# Patient Record
Sex: Female | Born: 1945 | Race: White | Hispanic: No | Marital: Married | Smoking: Former smoker
Health system: Southern US, Community
[De-identification: ages and names within clinical notes are randomized; demographics above are authoritative.]

## PROBLEM LIST (undated history)

## (undated) DIAGNOSIS — J189 Pneumonia, unspecified organism: Secondary | ICD-10-CM

## (undated) DIAGNOSIS — J302 Other seasonal allergic rhinitis: Secondary | ICD-10-CM

## (undated) DIAGNOSIS — E785 Hyperlipidemia, unspecified: Secondary | ICD-10-CM

## (undated) DIAGNOSIS — F419 Anxiety disorder, unspecified: Secondary | ICD-10-CM

## (undated) DIAGNOSIS — R51 Headache: Secondary | ICD-10-CM

## (undated) DIAGNOSIS — K589 Irritable bowel syndrome without diarrhea: Secondary | ICD-10-CM

## (undated) DIAGNOSIS — K219 Gastro-esophageal reflux disease without esophagitis: Secondary | ICD-10-CM

## (undated) DIAGNOSIS — I10 Essential (primary) hypertension: Secondary | ICD-10-CM

## (undated) DIAGNOSIS — J439 Emphysema, unspecified: Secondary | ICD-10-CM

## (undated) DIAGNOSIS — Z8349 Family history of other endocrine, nutritional and metabolic diseases: Secondary | ICD-10-CM

## (undated) DIAGNOSIS — G589 Mononeuropathy, unspecified: Secondary | ICD-10-CM

## (undated) DIAGNOSIS — R011 Cardiac murmur, unspecified: Secondary | ICD-10-CM

## (undated) DIAGNOSIS — M1990 Unspecified osteoarthritis, unspecified site: Secondary | ICD-10-CM

## (undated) DIAGNOSIS — S149XXA Injury of unspecified nerves of neck, initial encounter: Secondary | ICD-10-CM

## (undated) DIAGNOSIS — Z8489 Family history of other specified conditions: Secondary | ICD-10-CM

## (undated) DIAGNOSIS — I499 Cardiac arrhythmia, unspecified: Secondary | ICD-10-CM

## (undated) DIAGNOSIS — D649 Anemia, unspecified: Secondary | ICD-10-CM

## (undated) HISTORY — PX: EYE SURGERY: SHX253

## (undated) HISTORY — PX: FINE NEEDLE ASPIRATION: SHX406

## (undated) HISTORY — DX: Hyperlipidemia, unspecified: E78.5

## (undated) HISTORY — DX: Essential (primary) hypertension: I10

## (undated) HISTORY — PX: ABLATION: SHX5711

## (undated) HISTORY — PX: CARDIAC CATHETERIZATION: SHX172

## (undated) HISTORY — DX: Family history of other endocrine, nutritional and metabolic diseases: Z83.49

## (undated) HISTORY — PX: CHOLECYSTECTOMY: SHX55

## (undated) HISTORY — PX: TONSILLECTOMY: SUR1361

## (undated) HISTORY — PX: CYST EXCISION: SHX5701

## (undated) HISTORY — PX: APPENDECTOMY: SHX54

---

## 2010-05-01 LAB — BASIC METABOLIC PANEL
Creatinine: 0.7 mg/dL (ref 0.5–1.1)
Glucose: 92 mg/dL
Potassium: 4.2 mmol/L (ref 3.4–5.3)
Sodium: 141 mmol/L (ref 137–147)

## 2010-05-01 LAB — HEPATIC FUNCTION PANEL
ALT: 12 U/L (ref 7–35)
AST: 13 U/L (ref 13–35)
Bilirubin, Total: 0.2 mg/dL

## 2010-05-01 LAB — HEMOGLOBIN A1C: Hgb A1c MFr Bld: 6 % (ref 4.0–6.0)

## 2010-07-13 HISTORY — PX: CHOLECYSTECTOMY, LAPAROSCOPIC: SHX56

## 2010-07-24 LAB — HEPATIC FUNCTION PANEL
AST: 10 U/L — AB (ref 13–35)
Bilirubin, Total: 0.2 mg/dL

## 2010-07-24 LAB — CBC AND DIFFERENTIAL: WBC: 14.5 10^3/mL

## 2010-07-24 LAB — LIPID PANEL: HDL: 38 mg/dL (ref 35–70)

## 2010-11-09 ENCOUNTER — Ambulatory Visit (INDEPENDENT_AMBULATORY_CARE_PROVIDER_SITE_OTHER): Payer: Medicare HMO | Admitting: Family Medicine

## 2010-11-09 ENCOUNTER — Encounter: Payer: Self-pay | Admitting: Family Medicine

## 2010-11-09 VITALS — BP 136/80 | HR 81 | Ht 65.5 in | Wt 193.0 lb

## 2010-11-09 DIAGNOSIS — E8881 Metabolic syndrome: Secondary | ICD-10-CM

## 2010-11-09 DIAGNOSIS — E0590 Thyrotoxicosis, unspecified without thyrotoxic crisis or storm: Secondary | ICD-10-CM

## 2010-11-09 DIAGNOSIS — J449 Chronic obstructive pulmonary disease, unspecified: Secondary | ICD-10-CM

## 2010-11-09 DIAGNOSIS — F17200 Nicotine dependence, unspecified, uncomplicated: Secondary | ICD-10-CM

## 2010-11-09 DIAGNOSIS — E785 Hyperlipidemia, unspecified: Secondary | ICD-10-CM

## 2010-11-09 DIAGNOSIS — R7309 Other abnormal glucose: Secondary | ICD-10-CM

## 2010-11-09 DIAGNOSIS — J329 Chronic sinusitis, unspecified: Secondary | ICD-10-CM

## 2010-11-09 DIAGNOSIS — E0500 Thyrotoxicosis with diffuse goiter without thyrotoxic crisis or storm: Secondary | ICD-10-CM | POA: Insufficient documentation

## 2010-11-09 DIAGNOSIS — R3129 Other microscopic hematuria: Secondary | ICD-10-CM

## 2010-11-09 DIAGNOSIS — Z720 Tobacco use: Secondary | ICD-10-CM

## 2010-11-09 DIAGNOSIS — Z23 Encounter for immunization: Secondary | ICD-10-CM

## 2010-11-09 DIAGNOSIS — I10 Essential (primary) hypertension: Secondary | ICD-10-CM | POA: Insufficient documentation

## 2010-11-09 MED ORDER — AZITHROMYCIN 250 MG PO TABS: ORAL_TABLET | ORAL | Status: AC

## 2010-11-09 MED ORDER — COLESEVELAM HCL 3.75 G PO PACK: PACK | ORAL | Status: DC

## 2010-11-09 NOTE — Patient Instructions (Signed)
We should schedule your for spirometry when you are feeling well to stage your COPD.

## 2010-11-09 NOTE — Progress Notes (Signed)
Subjective:    Patient ID: Laura Ellison, female    DOB: Apr 22, 1945, 65 y.o.   MRN: [id number]  HPI Hx of hyperthyroidism. On methimazole.  She was diagnosed approximately 2 years ago. Need referral to endocrinology.    URI-she has had cold symptoms for approximately 4 weeks. She's had predominantly sinus congestion and sinus pressure and pain and headache. No tear pain or pressure. Mild sore throat occasionally. No fever. She has tried some cough and cold medications. She was also seen recently at an urgent care and treated with steroids, albuterol and put on Levaquin. She said she ended up going to the emergency room after taking the steroids because it caused chest pain. She does not want to take these again. She does use her Advair occasionally. She said she was diagnosed with COPD in the past but her last pulmonary infection testing was about 3 years ago. She did complete the Levaquin but says she really doesn't feel any better. She has been taking her allergy medication. She recently moved here from [place].  Review of Systems  Constitutional: Negative for fever, diaphoresis and unexpected weight change.  HENT: Negative for hearing loss, rhinorrhea and tinnitus.        Seasonal allergies  Eyes: Negative for visual disturbance.  Respiratory: Positive for cough. Negative for wheezing.   Cardiovascular: Negative for chest pain and palpitations.  Gastrointestinal: Negative for nausea, vomiting, diarrhea and blood in stool.  Genitourinary: Negative for vaginal bleeding, vaginal discharge and difficulty urinating.       Nighttime urination  Musculoskeletal: Positive for myalgias and arthralgias.  Skin: Negative for rash.  Neurological: Positive for headaches.  Hematological: Negative for adenopathy. Does not bruise/bleed easily.  Psychiatric/Behavioral: Negative for sleep disturbance and dysphoric mood. The patient is not nervous/anxious.        BP 136/80  Pulse 81  Ht 5' 5.5" (1.664  m)  Wt 193 lb (87.544 kg)  BMI 31.63 kg/m2    Allergies  Allergen Reactions  . Augmentin Diarrhea    Severe abdominal cramps and diarrhea  . Crestor (Rosuvastatin Calcium)   . Lipitor (Atorvastatin Calcium)   . Pravachol   . Prednisone Other (See Comments)    Chest discomfort  . Zocor (Simvastatin - High Dose)     Past Medical History  Diagnosis Date  . Hypertension   . Hyperlipidemia   . thyroid problem     Past Surgical History  Procedure Date  . Laproscopic gallbladder 07/2010    History   Social History  . Marital Status: Married    Spouse Name: Laura Ellison     Number of Children: 2  . Years of Education: HS   Occupational History  . Retired.     Social History Main Topics  . Smoking status: Current Everyday Smoker -- 2.0 packs/day for 50 years    Types: Cigarettes  . Smokeless tobacco: Not on file  . Alcohol Use: No  . Drug Use: No  . Sexually Active: Not Currently   Other Topics Concern  . Not on file   Social History Narrative   10 caffeinated drinks daily. No regular exercise. She recently moved here from [place] she now lives with her husband, daughter, granddaughter, son and sometimes grandson.    Family History  Problem Relation Age of Onset  . Lymphoma Father   . Diabetes Maternal Grandmother   . Stroke Maternal Grandmother   . Aortic aneurysm Mother     died from  . Stroke  Maternal Uncle   . Heart attack      Mothers family    [name] does not currently have medications on file.  Objective:   Physical Exam  Constitutional: She is oriented to person, place, and time. She appears well-developed and well-nourished.  HENT:  Head: Normocephalic and atraumatic.  Right Ear: External ear normal.  Left Ear: External ear normal.  Nose: Nose normal.  Mouth/Throat: Oropharynx is clear and moist.       TMs and canals are clear.   Eyes: Conjunctivae and EOM are normal. Pupils are equal, round, and reactive to light.  Neck: Neck supple.  No thyromegaly present.  Cardiovascular: Normal rate, regular rhythm and normal heart sounds.   Pulmonary/Chest: Effort normal and breath sounds normal. She has no wheezes.  Lymphadenopathy:    She has no cervical adenopathy.  Neurological: She is alert and oriented to person, place, and time.  Skin: Skin is warm and dry.  Psychiatric: She has a normal mood and affect.          Assessment & Plan:  Sinusitis-I will try treating her with azithromycin which will cover atypicals and she did not improve with the Levaquin. Her lung exam is normal today and she does not appear to be short of breath thus I do not think she is having a COPD exacerbation.  COPD-we need to schedule her for spirometry and she is feeling well to better stage her since her last testing was approximately 3 years ago. I don't know if she needs to use the Advair regularly or not we will need to determine this by GOLD criteria.   Hyperlipidemia She had lab work done in June. We'll need to repeat her cholesterol levels in 6 months. Which would be December. She has been intolerant to multiple statins in the past. She is up and trying the WelChol powder. We'll see if this works and then recheck in a couple months after she is on the medication. If she does not tolerate it I requested she call the office and we can consider a different medication at all possible. Also regular exercise which she does not do as well as a low-fat diet and weight loss would be significantly helpful to her cholesterol.  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 I recommend repeating that in December to make sure that she is staying stable. Her husband is a diabetic.

## 2010-11-10 ENCOUNTER — Encounter: Payer: Self-pay | Admitting: Family Medicine

## 2010-11-10 DIAGNOSIS — E8881 Metabolic syndrome: Secondary | ICD-10-CM | POA: Insufficient documentation

## 2010-11-10 DIAGNOSIS — Z720 Tobacco use: Secondary | ICD-10-CM | POA: Insufficient documentation

## 2010-12-06 ENCOUNTER — Encounter: Payer: Self-pay | Admitting: Family Medicine

## 2010-12-06 DIAGNOSIS — E010 Iodine-deficiency related diffuse (endemic) goiter: Secondary | ICD-10-CM | POA: Insufficient documentation

## 2010-12-06 DIAGNOSIS — J439 Emphysema, unspecified: Secondary | ICD-10-CM | POA: Insufficient documentation

## 2010-12-07 ENCOUNTER — Encounter: Payer: Self-pay | Admitting: Family Medicine

## 2011-02-13 ENCOUNTER — Emergency Department (INDEPENDENT_AMBULATORY_CARE_PROVIDER_SITE_OTHER)
Admission: EM | Admit: 2011-02-13 | Discharge: 2011-02-13 | Disposition: A | Discharge status: Home / Self Care | Payer: Medicare Other | Source: Home / Self Care | Attending: Emergency Medicine | Admitting: Emergency Medicine

## 2011-02-13 DIAGNOSIS — J069 Acute upper respiratory infection, unspecified: Secondary | ICD-10-CM

## 2011-02-13 MED ORDER — AZITHROMYCIN 250 MG PO TABS: ORAL_TABLET | ORAL | Status: AC

## 2011-02-13 NOTE — ED Notes (Signed)
States upper respiratory symptoms started 2 days ago

## 2011-02-13 NOTE — ED Provider Notes (Signed)
History     CSN: [id number]  Arrival date & time 02/13/11  1719   First MD Initiated Contact with Patient 02/13/11 1747      Chief Complaint  Patient presents with  . URI    (Consider location/radiation/quality/duration/timing/severity/associated sxs/prior treatment) HPI Laura Ellison is a 66 y.o. female who complains of onset of cold symptoms for a few days.  She has emphysema and is trying to avoid getting worse. No sore throat + cough No pleuritic pain No wheezing + nasal congestion + post-nasal drainage + sinus pain/pressure No chest congestion No itchy/red eyes No earache No hemoptysis No SOB No chills/sweats No fever No nausea No vomiting No abdominal pain No diarrhea No skin rashes No fatigue No myalgias No headache    Past Medical History  Diagnosis Date  . Hypertension   . Hyperlipidemia   . thyroid problem     Past Surgical History  Procedure Date  . Cholecystectomy, laparoscopic 07/13/2010    Family History  Problem Relation Age of Onset  . Lymphoma Father   . Diabetes Maternal Grandmother   . Stroke Maternal Grandmother   . Aortic aneurysm Mother     died from  . Stroke Maternal Uncle   . Heart attack      Mothers family  . Aortic aneurysm Mother     Deceased    History  Substance Use Topics  . Smoking status: Current Everyday Smoker -- 2.0 packs/day for 50 years    Types: Cigarettes  . Smokeless tobacco: Not on file  . Alcohol Use: No    OB History    Grav Para Term Preterm Abortions TAB SAB Ect Mult Living                  Review of Systems  Allergies  Augmentin; Crestor; Lipitor; Pravachol; Prednisone; and Zocor  Home Medications   Current Outpatient Rx  Name Route Sig Dispense Refill  . ALPRAZOLAM 0.25 MG PO TABS Oral Take 0.25 mg by mouth at bedtime as needed.      . AZITHROMYCIN 250 MG PO TABS  Use as directed 1 each 0  . VITAMIN D3 1000 UNITS PO CAPS Oral Take by mouth.      . COLESEVELAM HCL 3.75 G PO PACK  Mix one  pack with a glass of water an ddrink once a day. 30 each 6  . CYANOCOBALAMIN 100 MCG PO TABS Oral Take 100 mcg by mouth daily.      Marland Kitchen FEXOFENADINE HCL 180 MG PO TABS Oral Take 180 mg by mouth daily.      Marland Kitchen ADVAIR DISKUS IN Inhalation Inhale into the lungs.      Marland Kitchen FLUTICASONE-SALMETEROL 250-50 MCG/DOSE IN AEPB Inhalation Inhale 1 puff into the lungs every 12 (twelve) hours.      Marland Kitchen LOSARTAN POTASSIUM 50 MG PO TABS Oral Take 50 mg by mouth daily.      Marland Kitchen METHIMAZOLE 10 MG PO TABS Oral Take 10 mg by mouth 3 (three) times daily.      Marland Kitchen METOPROLOL TARTRATE 25 MG PO TABS Oral Take 25 mg by mouth 2 (two) times daily.      . CENTRUM SILVER ULTRA WOMENS PO Oral Take by mouth.      . OMEPRAZOLE 40 MG PO CPDR Oral Take 40 mg by mouth daily.        BP 129/80  Pulse 69  Temp(Src) 98.3 F (36.8 C) (Oral)  Resp 24  Ht 5\' 6"  (1.676 m)  Wt 193 lb (87.544 kg)  BMI 31.15 kg/m2  SpO2 94%  Physical Exam  Nursing note and vitals reviewed. Constitutional: She is oriented to person, place, and time. She appears well-developed and well-nourished.  HENT:  Head: Normocephalic and atraumatic.  Right Ear: Tympanic membrane, external ear and ear canal normal.  Left Ear: Tympanic membrane, external ear and ear canal normal.  Nose: Mucosal edema and rhinorrhea present.  Mouth/Throat: Posterior oropharyngeal erythema present. No oropharyngeal exudate or posterior oropharyngeal edema.  Eyes: No scleral icterus.  Neck: Neck supple.  Cardiovascular: Regular rhythm and normal heart sounds.   Pulmonary/Chest: Effort normal and breath sounds normal. No respiratory distress.  Neurological: She is alert and oriented to person, place, and time.  Skin: Skin is warm and dry.  Psychiatric: She has a normal mood and affect. Her speech is normal.    ED Course  Procedures (including critical care time)  Labs Reviewed - No data to display No results found.   1. Acute upper respiratory infections of unspecified site        MDM  1)  Take the prescribed antibiotic as instructed. 2)  Use nasal saline solution (over the counter) at least 3 times a day. 3)  Use over the counter decongestants like Zyrtec-D every 12 hours as needed to help with congestion.  If you have hypertension, do not take medicines with sudafed.  4)  Can take tylenol every 6 hours or motrin every 8 hours for pain or fever. 5)  Follow up with your primary doctor if no improvement in 5-7 days, sooner if increasing pain, fever, or new symptoms.     Lily Kocher, MD 02/13/11 [phone]

## 2011-03-12 ENCOUNTER — Ambulatory Visit: Payer: Medicare Other | Admitting: Family Medicine

## 2011-03-15 ENCOUNTER — Ambulatory Visit (INDEPENDENT_AMBULATORY_CARE_PROVIDER_SITE_OTHER): Payer: Medicare Other | Admitting: Family Medicine

## 2011-03-15 ENCOUNTER — Encounter: Payer: Self-pay | Admitting: Family Medicine

## 2011-03-15 VITALS — BP 129/72 | HR 86 | Wt 194.0 lb

## 2011-03-15 DIAGNOSIS — M542 Cervicalgia: Secondary | ICD-10-CM

## 2011-03-15 DIAGNOSIS — R7301 Impaired fasting glucose: Secondary | ICD-10-CM

## 2011-03-15 DIAGNOSIS — I10 Essential (primary) hypertension: Secondary | ICD-10-CM

## 2011-03-15 DIAGNOSIS — E785 Hyperlipidemia, unspecified: Secondary | ICD-10-CM

## 2011-03-15 LAB — POCT GLYCOSYLATED HEMOGLOBIN (HGB A1C): Hemoglobin A1C: 5.8

## 2011-03-15 NOTE — Progress Notes (Signed)
  Subjective:    Patient ID: Laura Ellison, female    DOB: 1945-05-26, 66 y.o.   MRN: [id number]  HPI HTN - she's taking her medications regularly. No chest pain, shortness of breath. She does not take anti-inflammatories. She wants to do what cough medication she can use that don't raise blood pressure.  Insulin resistance - she has a history of insulin resistance and she's to recheck her A1c today to make sure that it's not increasing it is stable and hopefully improved. No regular exercise.  Neck pain-she has been having neck pain for several months. She's been working with a [facility]. They did do x-rays and she brought a copy of this in. 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 She is mostly using Tylenol for pain control and wonders if there's anything else she can take.  Hyperlipidemia-she was unable to tolerate the WelChol secondary to GI symptoms. Unfortunately she has also tried multiple statins as well as TriCor and has not tolerated these either. At this point in time she says she opts to not treat her cholesterol. I did encourage healthy diet and regular exercise.  Review of Systems     Objective:   Physical Exam  Constitutional: She is oriented to person, place, and time. She appears well-developed and well-nourished.  HENT:  Head: Normocephalic and atraumatic.  Cardiovascular: Normal rate, regular rhythm and normal heart sounds.   Pulmonary/Chest: Effort normal and breath sounds normal.  Neurological: She is alert and oriented to person, place, and time.  Skin: Skin is warm and dry.  Psychiatric: She has a normal mood and affect. Her behavior is normal.          Assessment & Plan:  HTN- Well controlled. F/U in 6 months.   Insulin resistance- Continue to work on diet, exercise and low carbs and f/u in 6 months.   Neck Pain - dicussed options. She says she  Doesn't respond well to muscle relaxers. She says they really  can't help. She has never tried tramadol. She has never tried by him. She already has a prescription for alprazolam which she very rarely uses. I encouraged her to continue applying heat working with a [facility]. She may want to discuss with him physical therapy as this can also be very helpful especially if she has a lot of muscle spasm in her neck. She can also try half a tablet of her xanax at bedtime and see if this also helps with her headaches and muscle neck pain. Cna also consider tramadol , small quanity if needed. She is currently using tylenol  Hyperlipidemia-she has failed several statins, WelChol, TriCor mostly secondary to GI side effects. At this point time she opts to not treat her cholesterol. I still would like to recheck it in one year when she is due. Continue to work on exercise, diet and weight loss.

## 2011-03-16 LAB — BASIC METABOLIC PANEL WITH GFR
GFR, Est African American: 89 mL/min
GFR, Est Non African American: 78 mL/min
Glucose, Bld: 92 mg/dL (ref 70–99)
Potassium: 4.9 mEq/L (ref 3.5–5.3)
Sodium: 141 mEq/L (ref 135–145)

## 2011-03-16 NOTE — Progress Notes (Signed)
Quick Note:  All labs are normal. ______ 

## 2011-05-11 ENCOUNTER — Other Ambulatory Visit: Payer: Self-pay | Admitting: *Deleted

## 2011-05-11 MED ORDER — OMEPRAZOLE 40 MG PO CPDR: 40.0000 mg | DELAYED_RELEASE_CAPSULE | Freq: Every day | ORAL | Status: DC

## 2011-05-11 MED ORDER — LEVOCETIRIZINE DIHYDROCHLORIDE 5 MG PO TABS: 5.0000 mg | ORAL_TABLET | Freq: Every evening | ORAL | Status: DC

## 2011-05-11 MED ORDER — METOPROLOL TARTRATE 25 MG PO TABS: 25.0000 mg | ORAL_TABLET | Freq: Two times a day (BID) | ORAL | Status: DC

## 2011-05-11 NOTE — Telephone Encounter (Signed)
Pt states that she has been taking Lezocetirizine daily for allergies. States that she got it from her doctor in [place]. Pt would like to know if you will give her refills on this med. Please advise.

## 2011-05-11 NOTE — Telephone Encounter (Signed)
OK to sent med,. I entered it but wasn't sure if wanted mail order so didn't sign it.

## 2011-06-06 ENCOUNTER — Ambulatory Visit (INDEPENDENT_AMBULATORY_CARE_PROVIDER_SITE_OTHER): Payer: Medicare HMO | Admitting: Physician Assistant

## 2011-06-06 ENCOUNTER — Encounter: Payer: Self-pay | Admitting: Physician Assistant

## 2011-06-06 VITALS — BP 127/70 | HR 78 | Temp 98.6°F | Ht 66.0 in | Wt 199.0 lb

## 2011-06-06 DIAGNOSIS — J069 Acute upper respiratory infection, unspecified: Secondary | ICD-10-CM

## 2011-06-06 DIAGNOSIS — L989 Disorder of the skin and subcutaneous tissue, unspecified: Secondary | ICD-10-CM

## 2011-06-06 MED ORDER — TRIAMCINOLONE ACETONIDE 0.1 % EX CREA: TOPICAL_CREAM | Freq: Two times a day (BID) | CUTANEOUS | Status: DC

## 2011-06-06 NOTE — Patient Instructions (Addendum)
Start Mucinex twice a day and drinking enough water. Increase Vit C and zinc to help decrease duration of any URI> Use cream on lesions twice a day for up to 2 weeks. Give [place] call by Friday if not feeling better. Use nasal spray 2 sprays each nostril daily.

## 2011-06-06 NOTE — Progress Notes (Signed)
  Subjective:    Patient ID: Laura Ellison, female    DOB: [birth year], 66 y.o.   MRN: [id number]  HPI Noticed little bumps 4 days ago on left shoulder and most recent bump on the corner of right eye. They do not hurt but do itch. Put OTC anti-itch cream and did nothing for them. Yesterday she started feeling very congested and feverish. She has not ran a fever but had a scratchy throat. She denies any ear pain. She has had very watery and itchy eyes. She denies any SOB or wheezing. She has been outside a lot recently. She wants to make sure she does not have measles.    Review of Systems     Objective:   Physical Exam  Constitutional: She is oriented to person, place, and time. She appears well-developed and well-nourished.  HENT:  Head: Normocephalic and atraumatic.  Right Ear: External ear normal.  Left Ear: External ear normal.  Mouth/Throat: Oropharynx is clear and moist. No oropharyngeal exudate.       TM's both had air bubbles present but could view ossicles clearly. NO pus or blood. Turbinates swollen and red.  Eyes:       Conjunctiva injected with watery discharge from both eyes.  Neck: Normal range of motion. Neck supple.  Cardiovascular: Normal rate and normal heart sounds.   Pulmonary/Chest: Effort normal and breath sounds normal. She has no wheezes.  Lymphadenopathy:    She has no cervical adenopathy.  Neurological: She is alert and oriented to person, place, and time.  Skin: Skin is warm and dry.       4 solitary papules that are flesh color and closed with scabs over them on left shoulder. 1 scabbed papule at corner of right eye.   Psychiatric: She has a normal mood and affect. Her behavior is normal.          Assessment & Plan:  Skin lesions, generalized- suspect most likely a bug bite that have scabbed over from scratching. Gave Triamcinolone cream to use twice a day for up to 2 weeks. Reassured patient that this was not measles and did not look like shingles  either.   URI-Start Mucinex twice a day and drinking enough water. Increase Vit C and zinc to help decrease duration of any URI. Gave sample of nasonex to use for nasal congestion. Call if not feeling better by Friday.

## 2011-06-25 ENCOUNTER — Emergency Department (INDEPENDENT_AMBULATORY_CARE_PROVIDER_SITE_OTHER)
Admission: EM | Admit: 2011-06-25 | Discharge: 2011-06-25 | Disposition: A | Discharge status: Home / Self Care | Payer: Medicare Other | Source: Home / Self Care | Attending: Family Medicine | Admitting: Family Medicine

## 2011-06-25 DIAGNOSIS — J0100 Acute maxillary sinusitis, unspecified: Secondary | ICD-10-CM

## 2011-06-25 DIAGNOSIS — J209 Acute bronchitis, unspecified: Secondary | ICD-10-CM

## 2011-06-25 MED ORDER — AZITHROMYCIN 250 MG PO TABS: ORAL_TABLET | ORAL | Status: AC

## 2011-06-25 NOTE — Discharge Instructions (Signed)
Take plain Mucinex (guaifenesin) daytime for cough and congestion.  Increase fluid intake, rest. Also recommend using saline nasal spray several times daily and saline nasal irrigation (AYR is a common brand) Stop all antihistamines for now, and other non-prescription cough/cold preparations. Continue Mucinex DM at bedtime. Follow-up with family doctor if not improving 7 to 10 days.

## 2011-06-25 NOTE — ED Provider Notes (Signed)
History     CSN: [id number]  Arrival date & time 06/25/11  [place]   First MD Initiated Contact with Patient 06/25/11 1923      Chief Complaint  Patient presents with  . Sinus Problem      HPI Comments: Patient complains of developing a cold about 2 weeks.   The cold improved but she has had persistent nasal congestion.  Over the past two days she has had increased left sinus congestion/pressure and a mild sore throat.  She has also started to cough more.  Her left ear feels clogged.  She has had chills but no fever.  The history is provided by the patient.    Past Medical History  Diagnosis Date  . Hypertension   . Hyperlipidemia   . thyroid problem     Past Surgical History  Procedure Date  . Cholecystectomy, laparoscopic 07/13/2010    Family History  Problem Relation Age of Onset  . Lymphoma Father   . Diabetes Maternal Grandmother   . Stroke Maternal Grandmother   . Aortic aneurysm Mother     died from  . Stroke Maternal Uncle   . Heart attack      Mothers family  . Aortic aneurysm Mother     Deceased    History  Substance Use Topics  . Smoking status: Current Everyday Smoker -- 2.0 packs/day for 50 years    Types: Cigarettes  . Smokeless tobacco: Not on file  . Alcohol Use: No    OB History    Grav Para Term Preterm Abortions TAB SAB Ect Mult Living                  Review of Systems + mild sore throat + cough No pleuritic pain No wheezing + nasal congestion + post-nasal drainage + left sinus pain/pressure No itchy/red eyes No earache No hemoptysis No SOB No fever, + chills No nausea No vomiting No abdominal pain No diarrhea No urinary symptoms No skin rashes + fatigue No myalgias No headache Used OTC meds without relief  Allergies  Amoxicillin-pot clavulanate; Crestor; Fenofibrate; Lipitor; Pravachol; Prednisone; Welchol; and Zocor  Home Medications   Current Outpatient Rx  Name Route Sig Dispense Refill  . ALPRAZOLAM 0.25 MG  PO TABS Oral Take 0.25 mg by mouth at bedtime as needed.      . AZITHROMYCIN 250 MG PO TABS  Take 2 tabs today; then begin one tab once daily for 4 more days. 6 each 0  . VITAMIN D3 1000 UNITS PO CAPS Oral Take by mouth.      . CYANOCOBALAMIN 100 MCG PO TABS Oral Take 100 mcg by mouth daily.      Marland Kitchen FEXOFENADINE HCL 180 MG PO TABS Oral Take 180 mg by mouth daily.      Marland Kitchen FLUTICASONE-SALMETEROL 250-50 MCG/DOSE IN AEPB Inhalation Inhale 1 puff into the lungs every 12 (twelve) hours.      Marland Kitchen LEVOCETIRIZINE DIHYDROCHLORIDE 5 MG PO TABS Oral Take 1 tablet (5 mg total) by mouth every evening. 30 tablet 6  . LOSARTAN POTASSIUM 50 MG PO TABS Oral Take 50 mg by mouth daily.      Marland Kitchen METHIMAZOLE 10 MG PO TABS Oral Take 10 mg by mouth 3 (three) times daily.      Marland Kitchen METOPROLOL TARTRATE 25 MG PO TABS Oral Take 1 tablet (25 mg total) by mouth 2 (two) times daily. 180 tablet 1  . CENTRUM SILVER ULTRA WOMENS PO Oral Take by mouth.      Marland Kitchen  OMEPRAZOLE 40 MG PO CPDR Oral Take 1 capsule (40 mg total) by mouth daily. 90 capsule 1  . TRIAMCINOLONE ACETONIDE 0.1 % EX CREA Topical Apply topically 2 (two) times daily. For 2 weeks or until bumps resolve. 30 g 0    BP 120/75  Pulse 74  Temp(Src) 98.6 F (37 C) (Oral)  Resp 18  Ht 5\' 6"  (1.676 m)  Wt 199 lb (90.266 kg)  BMI 32.12 kg/m2  SpO2 97%  Physical Exam Nursing notes and Vital Signs reviewed. Appearance:  Patient appears stated age, and in no acute distress.  Patient is obese (BMI 32.2) Eyes:  Pupils are equal, round, and reactive to light and accomodation.  Extraocular movement is intact.  Conjunctivae are not inflamed  Ears:  Canals normal.  Tympanic membranes normal.  Nose:  Mildly congested turbinates.  Left maxillary sinus tenderness is present.  Pharynx:  Normal Neck:  Supple.  Slightly tender shotty anterior/posterior nodes are palpated bilaterally  Lungs:   Bibasilar rhonchi posteriorly.  Breath sounds are equal.  Heart:  Regular rate and rhythm  without murmurs, rubs, or gallops.  Abdomen:  Nontender without masses or hepatosplenomegaly.  Bowel sounds are present.  No CVA or flank tenderness.  Extremities:  No edema.  No calf tenderness Skin:  No rash present.   ED Course  Procedures nne      1. Acute maxillary sinusitis   2. Acute bronchitis        Suspect a new onset viral URI    MDM   Begin Z-pack. Take plain Mucinex (guaifenesin) daytime for cough and congestion.  Increase fluid intake, rest. Also recommend using saline nasal spray several times daily and saline nasal irrigation (AYR is a common brand) Stop all antihistamines for now, and other non-prescription cough/cold preparations. Continue Mucinex DM at bedtime. Follow-up with family doctor if not improving 7 to 10 days.         Lattie Haw, MD 06/26/11 [phone]

## 2011-06-25 NOTE — ED Notes (Signed)
Facial pain, ear pain, temple pain all on left side x 3 days

## 2011-07-04 ENCOUNTER — Emergency Department (INDEPENDENT_AMBULATORY_CARE_PROVIDER_SITE_OTHER)
Admission: EM | Admit: 2011-07-04 | Discharge: 2011-07-04 | Disposition: A | Discharge status: Home / Self Care | Payer: Medicare Other | Source: Home / Self Care | Attending: Family Medicine | Admitting: Family Medicine

## 2011-07-04 ENCOUNTER — Emergency Department: Admit: 2011-07-04 | Discharge: 2011-07-04 | Disposition: A | Discharge status: Home / Self Care | Payer: Medicare Other

## 2011-07-04 ENCOUNTER — Encounter: Payer: Self-pay | Admitting: *Deleted

## 2011-07-04 DIAGNOSIS — R51 Headache: Secondary | ICD-10-CM

## 2011-07-04 DIAGNOSIS — M26629 Arthralgia of temporomandibular joint, unspecified side: Secondary | ICD-10-CM

## 2011-07-04 DIAGNOSIS — J029 Acute pharyngitis, unspecified: Secondary | ICD-10-CM

## 2011-07-04 DIAGNOSIS — R519 Headache, unspecified: Secondary | ICD-10-CM

## 2011-07-04 MED ORDER — LIDOCAINE VISCOUS 2 % MT SOLN: 15.0000 mL | Freq: Three times a day (TID) | OROMUCOSAL | Status: AC

## 2011-07-04 NOTE — Discharge Instructions (Signed)
While tests are pending, begin lidocaine viscous gargle four times daily.

## 2011-07-04 NOTE — ED Notes (Signed)
Pt c/o bilateral ear ache, pain with chewing, swollen gums, and sinus pain x 06/23/11. She has taken tylenol and mucinex.

## 2011-07-05 MED ORDER — SULFAMETHOXAZOLE-TRIMETHOPRIM 800-160 MG PO TABS: 1.0000 | ORAL_TABLET | Freq: Two times a day (BID) | ORAL | Status: AC

## 2011-07-05 NOTE — ED Provider Notes (Signed)
History     CSN: [id number]  Arrival date & time 07/04/11  1909   First MD Initiated Contact with Patient 07/04/11 1928      Chief Complaint  Patient presents with  . Nasal Congestion      HPI Comments: Patient returns after recent treatment with azithromycin reporting that she felt better for several days afterwards, but then developed recurrent face and sinus pain.  She continues to have bilateral ear pain, and sore throat.  The sore throat worsens during the day.  No fevers, chills, and sweats.  She notes that her ear pain often improves after chiropractic treatment.  She has pain about her jaws with chewing.  She believes that she has had gradual decrease in vision.  No fevers, chills, and sweats.  She has GERD and takes omeprazole 40mg  QAM.  She has recently been treated for thyroid nodules with Tapazole.  Patient is a 66 y.o. female presenting with ear pain. The history is provided by the patient.  Otalgia This is a recurrent problem. The current episode started more than 1 week ago. There is pain in both ears. The problem occurs constantly. The problem has not changed since onset.There has been no fever. The pain is mild. Associated symptoms include headaches, sore throat and neck pain. Pertinent negatives include no ear discharge, no hearing loss, no rhinorrhea, no abdominal pain, no diarrhea, no vomiting, no cough and no rash.    Past Medical History  Diagnosis Date  . Hypertension   . Hyperlipidemia   . thyroid problem     Past Surgical History  Procedure Date  . Cholecystectomy, laparoscopic 07/13/2010    Family History  Problem Relation Age of Onset  . Lymphoma Father   . Diabetes Maternal Grandmother   . Stroke Maternal Grandmother   . Aortic aneurysm Mother     died from  . Stroke Maternal Uncle   . Heart attack      Mothers family  . Aortic aneurysm Mother     Deceased    History  Substance Use Topics  . Smoking status: Current Everyday Smoker -- 2.0  packs/day for 50 years    Types: Cigarettes  . Smokeless tobacco: Not on file  . Alcohol Use: No    OB History    Grav Para Term Preterm Abortions TAB SAB Ect Mult Living                  Review of Systems  Constitutional: Negative for fever and chills.  HENT: Positive for ear pain, congestion, sore throat and neck pain. Negative for hearing loss, rhinorrhea and ear discharge.   Respiratory: Negative for cough.   Cardiovascular: Negative.   Gastrointestinal: Negative for vomiting, abdominal pain and diarrhea.  Genitourinary: Negative.   Skin: Negative for rash.  Neurological: Positive for headaches.    Allergies  Amoxicillin-pot clavulanate; Crestor; Fenofibrate; Lipitor; Other; Pravachol; Prednisone; Welchol; and Zocor  Home Medications   Current Outpatient Rx  Name Route Sig Dispense Refill  . ALPRAZOLAM 0.25 MG PO TABS Oral Take 0.25 mg by mouth at bedtime as needed.      Marland Kitchen VITAMIN D3 1000 UNITS PO CAPS Oral Take by mouth.      . CYANOCOBALAMIN 100 MCG PO TABS Oral Take 100 mcg by mouth daily.      Marland Kitchen FEXOFENADINE HCL 180 MG PO TABS Oral Take 180 mg by mouth daily.      Marland Kitchen FLUTICASONE-SALMETEROL 250-50 MCG/DOSE IN AEPB Inhalation Inhale 1 puff  into the lungs every 12 (twelve) hours.      Marland Kitchen LEVOCETIRIZINE DIHYDROCHLORIDE 5 MG PO TABS Oral Take 1 tablet (5 mg total) by mouth every evening. 30 tablet 6  . LIDOCAINE VISCOUS 2 % MT SOLN Oral Take 15 mLs by mouth 4 (four) times daily -  before meals and at bedtime. Gargle, then expectorate 200 mL 0  . LOSARTAN POTASSIUM 50 MG PO TABS Oral Take 50 mg by mouth daily.      Marland Kitchen METHIMAZOLE 10 MG PO TABS Oral Take 10 mg by mouth 3 (three) times daily.      Marland Kitchen METOPROLOL TARTRATE 25 MG PO TABS Oral Take 1 tablet (25 mg total) by mouth 2 (two) times daily. 180 tablet 1  . CENTRUM SILVER ULTRA WOMENS PO Oral Take by mouth.      . OMEPRAZOLE 40 MG PO CPDR Oral Take 1 capsule (40 mg total) by mouth daily. 90 capsule 1  .  SULFAMETHOXAZOLE-TRIMETHOPRIM 800-160 MG PO TABS Oral Take 1 tablet by mouth 2 (two) times daily. 14 tablet 0  . TRIAMCINOLONE ACETONIDE 0.1 % EX CREA Topical Apply topically 2 (two) times daily. For 2 weeks or until bumps resolve. 30 g 0    BP 141/78  Pulse 85  Temp(Src) 98.2 F (36.8 C) (Oral)  Resp 18  Ht 5\' 6"  (1.676 m)  Wt 197 lb (89.359 kg)  BMI 31.80 kg/m2  SpO2 99%  Physical Exam Nursing notes and Vital Signs reviewed. Appearance:  Patient appears stated age, and in no acute distress Head:  Vague tenderness over temporal areas Eyes:  Pupils are equal, round, and reactive to light and accomodation.  Extraocular movement is intact.  Conjunctivae are not inflamed  Ears:  Canals normal.  Tympanic membranes normal.  There is bilateral TMJ tenderness Nose:  Mildly congested turbinates.  Maxillary sinus tenderness is present bilaterally Mouth:  Normal  Pharynx:  Normal Neck:  Supple.  Slightly tender shotty anterior/posterior nodes are palpated bilaterally.  There is tenderness over the thyroid.  Lungs:  Clear to auscultation.  Breath sounds are equal.  Heart:  Regular rate and rhythm without murmurs, rubs, or gallops.  Abdomen:  Nontender without masses or hepatosplenomegaly.  Bowel sounds are present.  No CVA or flank tenderness.  Extremities:  No edema.  No calf tenderness Skin:  No rash present.   ED Course  Procedures  none  Labs Reviewed  SEDIMENTATION RATE - Abnormal; Notable for the following:    Sed Rate 23 (*)    All other components within normal limits   Narrative:    Performed at:  [facility]                [address]                [place], [place] [address]   Tympanogram:  Left ear high peak height; right ear normal  Dg Sinuses Complete  07/04/2011  *RADIOLOGY REPORT*  Clinical Data: Facial pain and congestion despite antibiotics.  PARANASAL SINUSES - COMPLETE 3 + VIEW  Comparison:  None.  Findings:  The paranasal sinus are aerated.   There is no plain film evidence evidence of sinus opacification air-fluid levels or mucosal thickening.  No significant bone abnormalities are seen. If symptoms persist, CT sinuses limited recommended for further evaluation.  IMPRESSION: Negative.  Original Report Authenticated By: Elsie Stain, M.D.     1. Facial pain   2. TMJ arthralgia  3. Acute pharyngitis       MDM  Temporal arteritis is less of a concern with a minimally elevated Sed rate, although still possible.  She has several possible sources of head and neck pain:  TMJ, thyroiditis (presently being treated with methimazole), GERD (presently being treated with omeprazole 40mg  QAM).  Sinusitis less likely with negative sinus films.   At this time she would like to try another course of antibiotic because she achieved partial improvement after azithromycin.  She would also like medication for her sore throat. Will empirically begin Septra DS for one week (she does not recall having adverse effect) Rx for lidocaine viscous gargle.  She declines prednisone. If no improvement after Septra DS, trial of increasing omeprazole to BID.   Encouraged her to seek a consultation with ENT.        Lattie Haw, MD 07/05/11 [phone]

## 2011-07-09 ENCOUNTER — Telehealth: Payer: Self-pay | Admitting: Family Medicine

## 2011-08-16 ENCOUNTER — Other Ambulatory Visit: Payer: Self-pay | Admitting: *Deleted

## 2011-08-16 MED ORDER — OMEPRAZOLE 40 MG PO CPDR: 40.0000 mg | DELAYED_RELEASE_CAPSULE | Freq: Every day | ORAL | Status: DC

## 2011-09-12 ENCOUNTER — Ambulatory Visit (INDEPENDENT_AMBULATORY_CARE_PROVIDER_SITE_OTHER): Payer: Medicare HMO | Admitting: Family Medicine

## 2011-09-12 ENCOUNTER — Encounter: Payer: Self-pay | Admitting: Family Medicine

## 2011-09-12 VITALS — BP 138/72 | HR 67 | Ht 65.5 in | Wt 197.0 lb

## 2011-09-12 DIAGNOSIS — F17200 Nicotine dependence, unspecified, uncomplicated: Secondary | ICD-10-CM

## 2011-09-12 DIAGNOSIS — E785 Hyperlipidemia, unspecified: Secondary | ICD-10-CM

## 2011-09-12 DIAGNOSIS — Z79899 Other long term (current) drug therapy: Secondary | ICD-10-CM

## 2011-09-12 DIAGNOSIS — E8881 Metabolic syndrome: Secondary | ICD-10-CM

## 2011-09-12 DIAGNOSIS — I10 Essential (primary) hypertension: Secondary | ICD-10-CM

## 2011-09-12 DIAGNOSIS — Z720 Tobacco use: Secondary | ICD-10-CM

## 2011-09-12 LAB — POCT GLYCOSYLATED HEMOGLOBIN (HGB A1C): Hemoglobin A1C: 5.9

## 2011-09-12 MED ORDER — ALPRAZOLAM 0.25 MG PO TABS: 0.2500 mg | ORAL_TABLET | Freq: Every day | ORAL | Status: DC | PRN

## 2011-09-12 NOTE — Progress Notes (Signed)
  Subjective:    Patient ID: Laura Ellison, female    DOB: Dec 15, 1945, 66 y.o.   MRN: [id number]  HPI HTN - going to [facility] 3 x a week. No CP or SOB.  Taking meds regulalry. No SE. no recent NSAIDs or decongestants.  IFG - Has been exerciseing.  Has been eating a lot of chocolate lately.    Hyperlipidemia-has been over a year since we checked her levels. She has tried multiple statins and had GI side effects.  COPD - doing well on her Advair. She says she's not had any problems with exercise. Says only that has really bothered her has been the recent humidity. No recent flares.   Review of Systems     Objective:   Physical Exam  Constitutional: She is oriented to person, place, and time. She appears well-developed and well-nourished.  HENT:  Head: Normocephalic and atraumatic.  Cardiovascular: Normal rate, regular rhythm and normal heart sounds.   Pulmonary/Chest: Effort normal and breath sounds normal.  Neurological: She is alert and oriented to person, place, and time.  Skin: Skin is warm and dry.  Psychiatric: She has a normal mood and affect. Her behavior is normal.          Assessment & Plan:  HTN - Well controlled. F/U in 6 month.  Due for CMP and lpids. Continue current regimen.  Hyperlipidemia-she has tried multiple statins and has had GI side effects. She's also tried fenofibrate as well as WelChol and had GI side effects. We discussed potentially a trial of Livalo. Says she would like to think about it. We will go ahead and recheck her blood work. Continue to work on diet and exercise as this will help.  IFG - Recheck in 6 months. Her A1c is up from 5.8-5.9. Did encourage her to make sure she is getting regular exercise which she has started recently at the [facility]. Also encouraged her to continue to work on diet and weight loss. Recheck in 6 months.  Tob Abuse - she is still smoking 2 packs per day. She's not ready to quit. Smoking cessation encouraged.  COPD - Well  controlled. Encuraged smoking cessation.

## 2011-09-28 ENCOUNTER — Emergency Department (INDEPENDENT_AMBULATORY_CARE_PROVIDER_SITE_OTHER)
Admission: EM | Admit: 2011-09-28 | Discharge: 2011-09-28 | Disposition: A | Discharge status: Home / Self Care | Payer: Medicare HMO | Source: Home / Self Care

## 2011-09-28 ENCOUNTER — Encounter: Payer: Self-pay | Admitting: Emergency Medicine

## 2011-09-28 DIAGNOSIS — J069 Acute upper respiratory infection, unspecified: Secondary | ICD-10-CM

## 2011-09-28 MED ORDER — ALBUTEROL SULFATE HFA 108 (90 BASE) MCG/ACT IN AERS: 2.0000 | INHALATION_SPRAY | Freq: Four times a day (QID) | RESPIRATORY_TRACT | Status: DC | PRN

## 2011-09-28 MED ORDER — BENZONATATE 100 MG PO CAPS: 100.0000 mg | ORAL_CAPSULE | Freq: Three times a day (TID) | ORAL | Status: AC

## 2011-09-28 MED ORDER — DOXYCYCLINE HYCLATE 100 MG PO CAPS: 100.0000 mg | ORAL_CAPSULE | Freq: Two times a day (BID) | ORAL | Status: AC

## 2011-09-28 MED ORDER — FLUTICASONE-SALMETEROL 100-50 MCG/DOSE IN AEPB: INHALATION_SPRAY | RESPIRATORY_TRACT | Status: DC

## 2011-09-28 MED ORDER — GUAIFENESIN ER 600 MG PO TB12: 1200.0000 mg | ORAL_TABLET | Freq: Two times a day (BID) | ORAL | Status: DC

## 2011-09-28 NOTE — ED Notes (Signed)
Patient reports starting to feel chilled and congested 3 days ago; has been under family stress with husband having emergency surgery 6 days ago.

## 2011-09-28 NOTE — ED Provider Notes (Addendum)
History     CSN: [id number]  Arrival date & time 09/28/11  1833     Chief Complaint  Patient presents with  . Nasal Congestion     HPI Comments: Patient presents today with a complaint of chest congestion for 3 days.  She does relate having body aches, chills, and nasal congestion.  Denies any fever.  Has a cough with clear to green sputum depending on the time of day.  Has chest pain with coughing.  Denies chest pain with deep inhalation.  Is a smoker - smokes 2 packs per day.  Positive for COPD and emphysema.  Has been under family stress with husband having emergency surgery 6 days ago.  The history is provided by the patient.    Past Medical History  Diagnosis Date  . Hypertension   . Hyperlipidemia   . thyroid problem     Past Surgical History  Procedure Date  . Cholecystectomy, laparoscopic 07/13/2010    Family History  Problem Relation Age of Onset  . Lymphoma Father   . Diabetes Maternal Grandmother   . Stroke Maternal Grandmother   . Aortic aneurysm Mother     died from  . Stroke Maternal Uncle   . Heart attack      Mothers family  . Aortic aneurysm Mother     Deceased    History  Substance Use Topics  . Smoking status: Current Everyday Smoker -- 2.0 packs/day for 50 years    Types: Cigarettes  . Smokeless tobacco: Not on file  . Alcohol Use: No    OB History    Grav Para Term Preterm Abortions TAB SAB Ect Mult Living                  Review of Systems  Constitutional: Positive for chills.  HENT: Positive for congestion.   Eyes: Negative.   Respiratory: Positive for cough.        Chest congestion  Cardiovascular: Negative.   Gastrointestinal: Negative.   Musculoskeletal: Negative.   Neurological: Negative.   All other systems reviewed and are negative.    Allergies  Amoxicillin-pot clavulanate; Crestor; Fenofibrate; Lipitor; Other; Pravachol; Prednisone; Welchol; and Zocor  Home Medications   Current Outpatient Rx  Name Route Sig  Dispense Refill  . ALBUTEROL SULFATE HFA 108 (90 BASE) MCG/ACT IN AERS Inhalation Inhale 2 puffs into the lungs every 6 (six) hours as needed for wheezing. 1 Inhaler 2  . ALPRAZOLAM 0.25 MG PO TABS Oral Take 1 tablet (0.25 mg total) by mouth daily as needed for anxiety. 30 tablet 0  . VITAMIN D3 1000 UNITS PO CAPS Oral Take by mouth.      . CYANOCOBALAMIN 100 MCG PO TABS Oral Take 100 mcg by mouth daily.      Marland Kitchen DOXYCYCLINE HYCLATE 100 MG PO CAPS Oral Take 1 capsule (100 mg total) by mouth 2 (two) times daily. 20 capsule 0  . FEXOFENADINE HCL 180 MG PO TABS Oral Take 180 mg by mouth daily.      Marland Kitchen FLUTICASONE-SALMETEROL 100-50 MCG/DOSE IN AEPB  1 puff every 12 hours. 60 each 1  . FLUTICASONE-SALMETEROL 250-50 MCG/DOSE IN AEPB Inhalation Inhale 1 puff into the lungs every 12 (twelve) hours.      Marland Kitchen LEVOCETIRIZINE DIHYDROCHLORIDE 5 MG PO TABS Oral Take 1 tablet (5 mg total) by mouth every evening. 30 tablet 6  . LOSARTAN POTASSIUM 50 MG PO TABS Oral Take 50 mg by mouth daily.      Marland Kitchen  METHIMAZOLE 10 MG PO TABS Oral Take 10 mg by mouth 3 (three) times daily.      Marland Kitchen METOPROLOL TARTRATE 25 MG PO TABS Oral Take 1 tablet (25 mg total) by mouth 2 (two) times daily. 180 tablet 1  . CENTRUM SILVER ULTRA WOMENS PO Oral Take by mouth.      . OMEPRAZOLE 40 MG PO CPDR Oral Take 1 capsule (40 mg total) by mouth daily. 90 capsule 1  . TRIAMCINOLONE ACETONIDE 0.1 % EX CREA Topical Apply topically 2 (two) times daily. For 2 weeks or until bumps resolve. 30 g 0    BP 150/79  Pulse 78  Temp 98.3 F (36.8 C) (Oral)  Resp 18  Ht 5' 5.5" (1.664 m)  Wt 197 lb (89.359 kg)  BMI 32.28 kg/m2  SpO2 97%  Physical Exam  Nursing note and vitals reviewed. Constitutional: She is oriented to person, place, and time. She appears well-developed and well-nourished. No distress.  HENT:  Head: Normocephalic and atraumatic.  Right Ear: Ear canal normal. Tympanic membrane is bulging. Right ear injected TM: Clear Tympanic fluid.    Left Ear: Ear canal normal. Tympanic membrane is bulging.  Nose: Nose normal. Right sinus exhibits no maxillary sinus tenderness and no frontal sinus tenderness. Left sinus exhibits no maxillary sinus tenderness and no frontal sinus tenderness.  Mouth/Throat: Oropharynx is clear and moist and mucous membranes are normal.       Both TM has clear fluid   Eyes: Conjunctivae and EOM are normal. Pupils are equal, round, and reactive to light.  Neck: Normal range of motion. Neck supple. No thyromegaly present.  Cardiovascular: Normal rate, regular rhythm, normal heart sounds and intact distal pulses.  Exam reveals no gallop and no friction rub.   No murmur heard. Pulmonary/Chest: Effort normal and breath sounds normal. No respiratory distress. She has no wheezes. She exhibits no tenderness.  Abdominal: Soft. Bowel sounds are normal. She exhibits no distension. There is no tenderness.  Musculoskeletal: Normal range of motion.  Lymphadenopathy:    She has cervical adenopathy.  Neurological: She is alert and oriented to person, place, and time.  Skin: Skin is warm and dry.    ED Course  Procedures (including critical care time)    1. Upper respiratory infection       MDM  This is an upper respiratory infection.   Will start patient on Start Doxycycline 100 mg tab take 1 tab 2 times a day daily till complete because of history of COPD and smoking - she is likely to develop bronchitis with this cold.   Due to her history will also prescribe Advair Diskus 100-50 1 puff every 12 hours everyday till acute symptoms subside and Albuterol 108 mcg/ACT Inhaler take 2 puffs every 6 hours as needed.   Start Mucinex 600 mg tab take 2 tabs by mouth daily for 5 days. Rx for benzonatate 100 mg tab, take 1-2 tabs every 8 hours as needed for cough.       Junius Roads, NP 09/28/11 2006  Junius Roads, NP 10/03/11 1450

## 2011-10-01 NOTE — ED Provider Notes (Signed)
Agree with exam, assessment, and plan.   Lattie Haw, MD 10/01/11 [phone]

## 2011-10-03 NOTE — ED Provider Notes (Signed)
Agree with exam, assessment, and plan.   Audie Stayer A Charday Capetillo, MD 10/03/11 1931 

## 2011-10-04 ENCOUNTER — Telehealth: Payer: Self-pay | Admitting: Family Medicine

## 2011-10-04 MED ORDER — ALBUTEROL SULFATE (2.5 MG/3ML) 0.083% IN NEBU: 2.5000 mg | INHALATION_SOLUTION | RESPIRATORY_TRACT | Status: DC | PRN

## 2011-10-15 NOTE — Telephone Encounter (Signed)
Med refill

## 2011-10-26 ENCOUNTER — Other Ambulatory Visit: Payer: Self-pay | Admitting: *Deleted

## 2011-10-26 ENCOUNTER — Telehealth: Payer: Self-pay | Admitting: *Deleted

## 2011-10-26 MED ORDER — LOSARTAN POTASSIUM 50 MG PO TABS: 50.0000 mg | ORAL_TABLET | Freq: Every day | ORAL | Status: DC

## 2011-10-26 NOTE — Telephone Encounter (Signed)
rx sent for 6 months.

## 2011-10-26 NOTE — Telephone Encounter (Signed)
Patient requested refill on Losartan- I sent 30 days with 1 refill  Let me know if not ok to refill med

## 2011-10-30 ENCOUNTER — Encounter: Payer: Self-pay | Admitting: Family Medicine

## 2011-10-30 ENCOUNTER — Ambulatory Visit (INDEPENDENT_AMBULATORY_CARE_PROVIDER_SITE_OTHER): Payer: Medicare HMO | Admitting: Family Medicine

## 2011-10-30 VITALS — BP 119/69 | HR 78 | Wt 195.0 lb

## 2011-10-30 DIAGNOSIS — R21 Rash and other nonspecific skin eruption: Secondary | ICD-10-CM

## 2011-10-30 DIAGNOSIS — M545 Low back pain, unspecified: Secondary | ICD-10-CM

## 2011-10-30 DIAGNOSIS — Z23 Encounter for immunization: Secondary | ICD-10-CM

## 2011-10-30 NOTE — Patient Instructions (Signed)
We will call you the skin scraping.

## 2011-10-30 NOTE — Progress Notes (Signed)
  Subjective:    Patient ID: Laura Ellison, female    DOB: 1946/01/09, 66 y.o.   MRN: [id number]  HPI Rash started a couple of months after going to the beach. Comes and goes. Sometimes it is itchy but not all the time.  Laura Ellison seems ot make it worse. Sometime it burns.  Tried  A rx version coritsone cream but burned. Tried moisturizing it.   Back Pain -  Seeing chiropractor.  Says her pain is chronic now.  Says always very uncomfortable. Says can't walk too far any more. Says seeing chiropractor.  Told has a herniated disc.  Never done injection or PT.  Occ sciatica is flared as well.     Review of Systems     Objective:   Physical Exam  Constitutional: She appears well-developed and well-nourished.  HENT:  Head: Normocephalic and atraumatic.  Musculoskeletal:       Nontender over the lumbar spine. Nontender over the paraspinous muscles. Negative straight leg raise bilaterally. Hip, knee, ankle strength out of 5. Patellar reflexes 2+ bilaterally.  Skin: Skin is warm and dry. Rash noted.       Peaking somewhat raised maculopapular rash on the upper shoulders. It is not well demarcated. There and the skin.  Psychiatric: She has a normal mood and affect. Her behavior is normal.          Assessment & Plan:  Rash- KOH scraping collected to rule out tinea. If positive will treat with topical ketoconazole. If negative we'll treat with a mild to moderate dose of steroid cream. Unclear etiology why be triggering this. Only on the upper shoulders. She's not really changed that shampoos et Laura Ellison. She's tried moisturizing but does not seem to help. She also seems to have diffuse dry scaly skin which started since she moved here about a year ago. Recommend check TSH. She does have a follow with Dr. Jamelle Ellison in November. He is the one that usually checks her thyroid. [name]  Flu vaccine given today.  Back pain-We discussed different options. Could refer to PT in  conjunction with the chiropractic care vs sports med/ortho referral vs MRI. Pt is not interested in any type of surgical intervention so will start with PT. Will schedule here in our building.

## 2011-11-01 ENCOUNTER — Other Ambulatory Visit: Payer: Self-pay | Admitting: Family Medicine

## 2011-11-01 ENCOUNTER — Ambulatory Visit: Payer: Medicare HMO | Attending: Family Medicine | Admitting: Physical Therapy

## 2011-11-01 DIAGNOSIS — M545 Low back pain, unspecified: Secondary | ICD-10-CM | POA: Insufficient documentation

## 2011-11-01 DIAGNOSIS — R5381 Other malaise: Secondary | ICD-10-CM | POA: Insufficient documentation

## 2011-11-01 DIAGNOSIS — IMO0001 Reserved for inherently not codable concepts without codable children: Secondary | ICD-10-CM | POA: Insufficient documentation

## 2011-11-01 DIAGNOSIS — M25559 Pain in unspecified hip: Secondary | ICD-10-CM | POA: Insufficient documentation

## 2011-11-01 LAB — KOH PREP: RESULT - KOH: NONE SEEN

## 2011-11-01 MED ORDER — TRIAMCINOLONE ACETONIDE 0.5 % EX OINT: TOPICAL_OINTMENT | Freq: Every day | CUTANEOUS | Status: DC

## 2011-11-05 ENCOUNTER — Ambulatory Visit: Payer: Medicare HMO | Admitting: Physical Therapy

## 2011-11-07 ENCOUNTER — Ambulatory Visit: Payer: Medicare HMO | Attending: Family Medicine | Admitting: Physical Therapy

## 2011-11-07 DIAGNOSIS — R5381 Other malaise: Secondary | ICD-10-CM | POA: Insufficient documentation

## 2011-11-07 DIAGNOSIS — IMO0001 Reserved for inherently not codable concepts without codable children: Secondary | ICD-10-CM | POA: Insufficient documentation

## 2011-11-07 DIAGNOSIS — M545 Low back pain, unspecified: Secondary | ICD-10-CM | POA: Insufficient documentation

## 2011-11-07 DIAGNOSIS — M25559 Pain in unspecified hip: Secondary | ICD-10-CM | POA: Insufficient documentation

## 2011-11-12 ENCOUNTER — Ambulatory Visit: Payer: Medicare HMO | Admitting: Physical Therapy

## 2011-11-15 ENCOUNTER — Ambulatory Visit: Payer: Medicare HMO | Admitting: Physical Therapy

## 2011-11-19 ENCOUNTER — Encounter: Payer: Medicare HMO | Admitting: Physical Therapy

## 2011-11-20 ENCOUNTER — Ambulatory Visit: Payer: Medicare HMO | Admitting: Physical Therapy

## 2011-11-23 ENCOUNTER — Ambulatory Visit: Payer: Medicare HMO | Admitting: Physical Therapy

## 2011-11-26 ENCOUNTER — Encounter: Payer: Medicare HMO | Admitting: Physical Therapy

## 2011-11-29 ENCOUNTER — Encounter: Payer: Medicare HMO | Admitting: Physical Therapy

## 2012-01-07 ENCOUNTER — Ambulatory Visit (INDEPENDENT_AMBULATORY_CARE_PROVIDER_SITE_OTHER): Payer: Medicare HMO | Admitting: Family Medicine

## 2012-01-07 ENCOUNTER — Encounter: Payer: Self-pay | Admitting: Family Medicine

## 2012-01-07 VITALS — BP 143/78 | HR 80 | Ht 66.0 in | Wt 198.0 lb

## 2012-01-07 DIAGNOSIS — M545 Low back pain, unspecified: Secondary | ICD-10-CM

## 2012-01-07 DIAGNOSIS — I10 Essential (primary) hypertension: Secondary | ICD-10-CM

## 2012-01-07 DIAGNOSIS — J449 Chronic obstructive pulmonary disease, unspecified: Secondary | ICD-10-CM

## 2012-01-07 NOTE — Progress Notes (Signed)
  Subjective:    Patient ID: Laura Ellison, female    DOB: 04-09-1945, 66 y.o.   MRN: [id number]  HPI Low back pain. She is 92% better with PT. she has been trying to continue her home exercises. The she admits she hasn't been the best about it. She's been very impressed with how much pain relief she is actually gotten with her sciatica.  COPD - Has had a cough for a few days.  Still smoking.  She has not been taking her allergy medicines. She says some days she feels fine and other days she notices some productive sputum and increasing her cough. No fever. No significant changes for shortness of breath.  She has noticed an increase in postnasal drip.  HTN - Forgot to take her medication last night. Otherwise no chest pain, no shortness of breath.  Review of Systems     Objective:   Physical Exam  Constitutional: She is oriented to person, place, and time. She appears well-developed and well-nourished.  HENT:  Head: Normocephalic and atraumatic.  Right Ear: External ear normal.  Left Ear: External ear normal.  Nose: Nose normal.  Mouth/Throat: Oropharynx is clear and moist.       TMs and canals are clear.   Eyes: Conjunctivae normal and EOM are normal. Pupils are equal, round, and reactive to light.  Neck: Neck supple. No thyromegaly present.  Cardiovascular: Normal rate, regular rhythm and normal heart sounds.   Pulmonary/Chest: Effort normal and breath sounds normal. She has no wheezes.  Lymphadenopathy:    She has no cervical adenopathy.  Neurological: She is alert and oriented to person, place, and time.  Skin: Skin is warm and dry.  Psychiatric: She has a normal mood and affect.          Assessment & Plan:  Low back pain-significantly better. Almost back to her person. Encouraged to keep up her exercise as well as starting a walking routine to prevent further back pain issues. She says she will try.  COPD-stable. Explained her she continues to have a cough with  productive sputum that she needs to let me know. I would rather treat her before she gets worse. We also discussed the importance of getting back on her allergy medication as I do think this contributes to increased secretions.  HTN- uncontrolled today. Though she did not take her medication last night and has been eating salty foods. Monitor for low salt diet and to take medications regularly. Followup in 4 months to recheck blood pressure.

## 2012-01-18 ENCOUNTER — Other Ambulatory Visit: Payer: Self-pay | Admitting: *Deleted

## 2012-01-18 MED ORDER — OMEPRAZOLE 40 MG PO CPDR: 40.0000 mg | DELAYED_RELEASE_CAPSULE | Freq: Every day | ORAL | Status: DC

## 2012-02-15 ENCOUNTER — Ambulatory Visit (INDEPENDENT_AMBULATORY_CARE_PROVIDER_SITE_OTHER): Payer: Medicare HMO | Admitting: Physician Assistant

## 2012-02-15 ENCOUNTER — Telehealth: Payer: Self-pay | Admitting: Family Medicine

## 2012-02-15 ENCOUNTER — Encounter: Payer: Self-pay | Admitting: Physician Assistant

## 2012-02-15 VITALS — BP 127/65 | HR 74 | Temp 98.0°F | Ht 66.0 in | Wt 198.0 lb

## 2012-02-15 DIAGNOSIS — J329 Chronic sinusitis, unspecified: Secondary | ICD-10-CM

## 2012-02-15 MED ORDER — METOPROLOL TARTRATE 25 MG PO TABS: 25.0000 mg | ORAL_TABLET | Freq: Two times a day (BID) | ORAL | Status: DC

## 2012-02-15 MED ORDER — AZITHROMYCIN 250 MG PO TABS: ORAL_TABLET | ORAL | Status: DC

## 2012-02-15 NOTE — Patient Instructions (Addendum)
May consider using sinus rinse. Consider starting Decongestant.  Start Zpak.   Sinusitis Sinusitis is redness, soreness, and swelling (inflammation) of the paranasal sinuses. Paranasal sinuses are air pockets within the bones of your face (beneath the eyes, the middle of the forehead, or above the eyes). In healthy paranasal sinuses, mucus is able to drain out, and air is able to circulate through them by way of your nose. However, when your paranasal sinuses are inflamed, mucus and air can become trapped. This can allow bacteria and other germs to grow and cause infection. Sinusitis can develop quickly and last only a short time (acute) or continue over a long period (chronic). Sinusitis that lasts for more than 12 weeks is considered chronic.  CAUSES  Causes of sinusitis include:  Allergies.  Structural abnormalities, such as displacement of the cartilage that separates your nostrils (deviated septum), which can decrease the air flow through your nose and sinuses and affect sinus drainage.  Functional abnormalities, such as when the small hairs (cilia) that line your sinuses and help remove mucus do not work properly or are not present. SYMPTOMS  Symptoms of acute and chronic sinusitis are the same. The primary symptoms are pain and pressure around the affected sinuses. Other symptoms include:  Upper toothache.  Earache.  Headache.  Bad breath.  Decreased sense of smell and taste.  A cough, which worsens when you are lying flat.  Fatigue.  Fever.  Thick drainage from your nose, which often is green and may contain pus (purulent).  Swelling and warmth over the affected sinuses. DIAGNOSIS  Your caregiver will perform a physical exam. During the exam, your caregiver may:  Look in your nose for signs of abnormal growths in your nostrils (nasal polyps).  Tap over the affected sinus to check for signs of infection.  View the inside of your sinuses (endoscopy) with a special  imaging device with a light attached (endoscope), which is inserted into your sinuses. If your caregiver suspects that you have chronic sinusitis, one or more of the following tests may be recommended:  Allergy tests.  Nasal culture A sample of mucus is taken from your nose and sent to a lab and screened for bacteria.  Nasal cytology A sample of mucus is taken from your nose and examined by your caregiver to determine if your sinusitis is related to an allergy. TREATMENT  Most cases of acute sinusitis are related to a viral infection and will resolve on their own within 10 days. Sometimes medicines are prescribed to help relieve symptoms (pain medicine, decongestants, nasal steroid sprays, or saline sprays).  However, for sinusitis related to a bacterial infection, your caregiver will prescribe antibiotic medicines. These are medicines that will help kill the bacteria causing the infection.  Rarely, sinusitis is caused by a fungal infection. In theses cases, your caregiver will prescribe antifungal medicine. For some cases of chronic sinusitis, surgery is needed. Generally, these are cases in which sinusitis recurs more than 3 times per year, despite other treatments. HOME CARE INSTRUCTIONS   Drink plenty of water. Water helps thin the mucus so your sinuses can drain more easily.  Use a humidifier.  Inhale steam 3 to 4 times a day (for example, sit in the bathroom with the shower running).  Apply a warm, moist washcloth to your face 3 to 4 times a day, or as directed by your caregiver.  Use saline nasal sprays to help moisten and clean your sinuses.  Take over-the-counter or prescription medicines for  pain, discomfort, or fever only as directed by your caregiver. SEEK IMMEDIATE MEDICAL CARE IF:  You have increasing pain or severe headaches.  You have nausea, vomiting, or drowsiness.  You have swelling around your face.  You have vision problems.  You have a stiff neck.  You have  difficulty breathing. MAKE SURE YOU:   Understand these instructions.  Will watch your condition.  Will get help right away if you are not doing well or get worse. Document Released: 01/22/2005 Document Revised: 04/16/2011 Document Reviewed: 02/06/2011 [facility] Patient Information 2013 [place], [place].

## 2012-02-15 NOTE — Progress Notes (Signed)
  Subjective:    Patient ID: Laura Ellison, female    DOB: 06-14-1945, 67 y.o.   MRN: [id number]  Sinusitis This is a new problem. The current episode started in the past 7 days. The problem has been rapidly worsening since onset. There has been no fever. Her pain is at a severity of 6/10. The pain is moderate (Patient reports sinus pressure being on the left side only.). Associated symptoms include chills, congestion, coughing, diaphoresis, headaches, shortness of breath and sinus pressure. Pertinent negatives include no ear pain, hoarse voice, neck pain, sneezing, sore throat or swollen glands. (Patient does have emphysema.) Past treatments include nothing. The treatment provided no relief.      Review of Systems  Constitutional: Positive for chills and diaphoresis.  HENT: Positive for congestion and sinus pressure. Negative for ear pain, sore throat, hoarse voice, sneezing and neck pain.   Respiratory: Positive for cough and shortness of breath.   Neurological: Positive for headaches.       Objective:   Physical Exam  Constitutional: She is oriented to person, place, and time. She appears well-developed and well-nourished.  HENT:  Head: Normocephalic and atraumatic.  Right Ear: External ear normal.  Left Ear: External ear normal.  Mouth/Throat: Oropharynx is clear and moist.       TMs clear bilaterally.  Left-sided maxillary and frontal tenderness to palpation.  Bilateral turbinates red and swollen.  Eyes: Conjunctivae normal are normal. Right eye exhibits no discharge. Left eye exhibits no discharge.  Neck: Normal range of motion. Neck supple.       Slightly enlarged left sided anterior cervical lymph nodes.  Cardiovascular: Normal rate, regular rhythm and normal heart sounds.   Pulmonary/Chest: Effort normal.       Coarse breath sounds.   Lymphadenopathy:    She has cervical adenopathy.  Neurological: She is alert and oriented to person, place, and time.  Skin: Skin is  warm and dry.  Psychiatric: She has a normal mood and affect. Her behavior is normal.          Assessment & Plan:  Sinusitis-patient was given zpak to start. Reassured patient that it did not sound like infection had gone into her chest. Handout was given for symptomatic care. Discuss with patient waiting a few days to start antibiotic to see if would clear with a decongestant and sinus rinses. Patient very concerned that will go into her chest and cause more problems. She does have emphysema and has had to have numerous antibiotics in the past 2 improve.

## 2012-02-15 NOTE — Telephone Encounter (Signed)
rx sent to [facility]

## 2012-02-15 NOTE — Telephone Encounter (Signed)
Patient called and advised that she needs Metoprolol tartrate 25mg  called into [facility] in [place]. [facility].... Patient stated she has changed pharmacy's and she needs this prescription sent there. Thanks

## 2012-03-14 ENCOUNTER — Telehealth: Payer: Self-pay | Admitting: Family Medicine

## 2012-03-14 ENCOUNTER — Encounter: Payer: Self-pay | Admitting: Family Medicine

## 2012-03-14 ENCOUNTER — Ambulatory Visit (INDEPENDENT_AMBULATORY_CARE_PROVIDER_SITE_OTHER): Payer: Medicare HMO | Admitting: Family Medicine

## 2012-03-14 VITALS — BP 142/80 | HR 84 | Ht 65.5 in | Wt 198.0 lb

## 2012-03-14 DIAGNOSIS — E88819 Insulin resistance, unspecified: Secondary | ICD-10-CM

## 2012-03-14 DIAGNOSIS — R059 Cough, unspecified: Secondary | ICD-10-CM

## 2012-03-14 DIAGNOSIS — E8881 Metabolic syndrome: Secondary | ICD-10-CM

## 2012-03-14 DIAGNOSIS — I10 Essential (primary) hypertension: Secondary | ICD-10-CM

## 2012-03-14 DIAGNOSIS — H9190 Unspecified hearing loss, unspecified ear: Secondary | ICD-10-CM

## 2012-03-14 DIAGNOSIS — R05 Cough: Secondary | ICD-10-CM

## 2012-03-14 DIAGNOSIS — J329 Chronic sinusitis, unspecified: Secondary | ICD-10-CM

## 2012-03-14 LAB — COMPLETE METABOLIC PANEL WITH GFR
AST: 14 U/L (ref 0–37)
Albumin: 4.2 g/dL (ref 3.5–5.2)
BUN: 14 mg/dL (ref 6–23)
Calcium: 9.6 mg/dL (ref 8.4–10.5)
Chloride: 107 mEq/L (ref 96–112)
Creat: 0.87 mg/dL (ref 0.50–1.10)
GFR, Est Non African American: 70 mL/min
Glucose, Bld: 101 mg/dL — ABNORMAL HIGH (ref 70–99)
Potassium: 5 mEq/L (ref 3.5–5.3)

## 2012-03-14 LAB — CBC WITH DIFFERENTIAL/PLATELET
Basophils Absolute: 0.1 10*3/uL (ref 0.0–0.1)
Lymphocytes Relative: 30 % (ref 12–46)
Neutro Abs: 8.1 10*3/uL — ABNORMAL HIGH (ref 1.7–7.7)
Neutrophils Relative %: 61 % (ref 43–77)
Platelets: 359 10*3/uL (ref 150–400)
RDW: 16.4 % — ABNORMAL HIGH (ref 11.5–15.5)
WBC: 13.1 10*3/uL — ABNORMAL HIGH (ref 4.0–10.5)

## 2012-03-14 LAB — LIPID PANEL
Cholesterol: 293 mg/dL — ABNORMAL HIGH (ref 0–200)
LDL Cholesterol: 190 mg/dL — ABNORMAL HIGH (ref 0–99)
Triglycerides: 308 mg/dL — ABNORMAL HIGH (ref <150)
VLDL: 62 mg/dL — ABNORMAL HIGH (ref 0–40)

## 2012-03-14 MED ORDER — SULFAMETHOXAZOLE-TRIMETHOPRIM 800-160 MG PO TABS: 1.0000 | ORAL_TABLET | Freq: Two times a day (BID) | ORAL | Status: DC

## 2012-03-14 MED ORDER — LOSARTAN POTASSIUM 100 MG PO TABS: 100.0000 mg | ORAL_TABLET | Freq: Every day | ORAL | Status: DC

## 2012-03-14 NOTE — Telephone Encounter (Signed)
Call patient: Repeat blood pressure was still high. I'm increasing her losartan 200 mg. New perception sent to pharmacy. She can certainly take 2 a day of which she currently has if she has a lot left over.  Followup in 6 weeks for blood pressure check.

## 2012-03-14 NOTE — Progress Notes (Signed)
Subjective:    Patient ID: Laura Ellison, female    DOB: 1945-03-17, 67 y.o.   MRN: [id number]  HPI Had a sinus infection over the Holidays.  Says better but still doesn't feel well. She is wanting to sleep all the time. She stil has some nasal congestion.  Having more bodyaches with the weather change.  No fever.  Completed teh zpack about 2 weeks ago.  Still having chills. Aching behind the right ear.  Says would like to have a hearing test.  Says has noticed decreased hearing gradually. Stil intermittant cough that is productive. Laura Ellison is having some facial pressure and pain.   Having some tremors in her left hand and arm.  She only notices it occasionally. Doesn't even happen daily. She does drink a lot of caffeine. No other known triggers or exacerbating or alleviating factors. It is not disruptive to her using handwork eating. She notes that her father had a tremor for years and was diagnosed with Parkinson's disease for him as 20 years before they decided he really didn't have Parkinson's. Though she says she's really not worry about the tremor unless it becomes worse but did want to mention it to me today.  Hearing loss-her husband is with her today and he has concerns that she's not hearing as well. He notes that she often will have to ask people to repeat themselves and has noticed that she is turning up the volume on the TV to hear.   HTN -  Pt denies chest pain, SOB, dizziness, or heart palpitations.  Taking meds as directed w/o problems.  Denies medication side effects.  No regular exercise.   IFG- Last check was 6 months ago. Here to recheck. Not checking sugars at home. No exercise.    Review of Systems     Objective:   Physical Exam  Constitutional: She is oriented to person, place, and time. She appears well-developed and well-nourished.  HENT:  Head: Normocephalic and atraumatic.  Right Ear: External ear normal.  Left Ear: External ear normal.  Nose: Nose normal.   Mouth/Throat: Oropharynx is clear and moist.       TMs and canals are clear.   Eyes: Conjunctivae normal and EOM are normal. Pupils are equal, round, and reactive to light.  Neck: Neck supple. No thyromegaly present.  Cardiovascular: Normal rate, regular rhythm and normal heart sounds.   Pulmonary/Chest: Effort normal and breath sounds normal. She has no wheezes.       Diffuse ronchi  Lymphadenopathy:    She has no cervical adenopathy.  Neurological: She is alert and oriented to person, place, and time.  Skin: Skin is warm and dry.  Psychiatric: She has a normal mood and affect.          Assessment & Plan:  Sinusitis - will tx with bactrim x 14 days. Call if not better in 2 weeks. H.O given.  Continue symptomatic care as needed.   Tremor-She really doesn't want any further workup at this time. She has no noticeable tremor on exam today. Certainly she could have an essential tremor. I did discuss with her that caffeine can sometimes make this worse and to really consider reducing her caffeine intake.  HTN - Uncontrolled today. I like to increase her losartan 100 mg for better control.F/U in 1 month for repeat BP check.    IFG - repeat a1c is 5.8 today which is fantastic. It s stable. Continue to work on [employer].  Tob abuse - continue to smoke. Not ready to quit.   Hearing loss-refer for audiometry for further evaluation. She thinks her inattentiveness with hearing could be more her ADD versus or hearing loss her husband feels like it could be hearing loss.  Either way, she is willing to go for testing.

## 2012-03-14 NOTE — Patient Instructions (Signed)

## 2012-03-17 ENCOUNTER — Telehealth: Payer: Self-pay

## 2012-03-17 NOTE — Telephone Encounter (Signed)
Called pt to inform that new Rx has been sent to [facility]

## 2012-03-18 ENCOUNTER — Other Ambulatory Visit: Payer: Self-pay | Admitting: *Deleted

## 2012-03-18 MED ORDER — PITAVASTATIN CALCIUM 2 MG PO TABS: 2.0000 mg | ORAL_TABLET | Freq: Every day | ORAL | Status: DC

## 2012-03-23 NOTE — Telephone Encounter (Signed)
Opened in error

## 2012-03-27 ENCOUNTER — Encounter: Payer: Self-pay | Admitting: Sports Medicine

## 2012-03-27 ENCOUNTER — Ambulatory Visit (INDEPENDENT_AMBULATORY_CARE_PROVIDER_SITE_OTHER): Payer: Medicare HMO | Admitting: Sports Medicine

## 2012-03-27 VITALS — BP 127/65 | HR 74 | Wt 196.0 lb

## 2012-03-27 DIAGNOSIS — M51379 Other intervertebral disc degeneration, lumbosacral region without mention of lumbar back pain or lower extremity pain: Secondary | ICD-10-CM

## 2012-03-27 DIAGNOSIS — M51369 Other intervertebral disc degeneration, lumbar region without mention of lumbar back pain or lower extremity pain: Secondary | ICD-10-CM | POA: Insufficient documentation

## 2012-03-27 DIAGNOSIS — M5137 Other intervertebral disc degeneration, lumbosacral region: Secondary | ICD-10-CM

## 2012-03-27 DIAGNOSIS — M5136 Other intervertebral disc degeneration, lumbar region: Secondary | ICD-10-CM

## 2012-03-27 DIAGNOSIS — M5126 Other intervertebral disc displacement, lumbar region: Secondary | ICD-10-CM

## 2012-03-27 MED ORDER — PREDNISONE 10 MG PO TABS: ORAL_TABLET | ORAL | Status: DC

## 2012-03-27 MED ORDER — GABAPENTIN 300 MG PO CAPS: ORAL_CAPSULE | ORAL | Status: DC

## 2012-03-27 NOTE — Assessment & Plan Note (Signed)
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 Continue Flexeril and Norco from the hospital. I'm adding gabapentin and a prednisone taper. I would like her to do formal physical therapy. She'll come back to see me in 4 weeks and at that point if still no better we can pursue MRI for interventional injection planning.

## 2012-03-27 NOTE — Progress Notes (Signed)
  Subjective:    CC: Acute back pain  HPI: This is a very pleasant 67 year old female who comes in with the acute onset of low back pain about a week ago. She recalls twisting to the right and then coughing, and having intense pain localized in the right side of her low back radiating around into her groin and down the anterior aspect of her right thigh. She went to the emergency department where a CT scan of her lumbar spine was done as well as she was given narcotics and muscle relaxants. She was asked to followup with orthopedics. Currently pain is slightly better but still very severe, worse with Valsalva, worse with sitting and flexion, no bowel or bladder changes. No constitutional symptoms.  Past medical history, Surgical history, Family history not pertinant except as noted below, Social history, Allergies, and medications have been entered into the medical record, reviewed, and no changes needed.   Review of Systems: No headache, visual changes, nausea, vomiting, diarrhea, constipation, dizziness, abdominal pain, skin rash, fevers, chills, night sweats, weight loss, swollen lymph nodes, body aches, joint swelling, muscle aches, chest pain, shortness of breath, mood changes, visual or auditory hallucinations.   Objective:   General: Well Developed, well nourished, and in no acute distress.  Neuro/Psych: Alert and oriented x3, extra-ocular muscles intact, able to move all 4 extremities, sensation grossly intact. Skin: Warm and dry, no rashes noted.  Respiratory: Not using accessory muscles, speaking in full sentences, trachea midline.  Cardiovascular: Pulses palpable, no extremity edema. Abdomen: Does not appear distended. Back Exam:  Inspection: Unremarkable  Motion: Flexion 45 deg, Extension 45 deg, Side Bending to 45 deg bilaterally,  Rotation to 45 deg bilaterally  SLR laying: Positive with reproduction of L3 radicular symptoms.  XSLR laying: Negative  Palpable tenderness:  None. FABER: negative. Sensory change: Gross sensation intact to all lumbar and sacral dermatomes.  Reflexes: 2+ at both patellar tendons, 2+ at achilles tendons, Babinski's downgoing.  Strength at foot  Plantar-flexion: 5/5 Dorsi-flexion: 5/5 Eversion: 5/5 Inversion: 5/5  Leg strength  Quad: 5/5 Hamstring: 5/5 Hip flexor: 5/5 Hip abductors: 5/5  Gait unremarkable.  CT scan was reviewed, there is multilevel degenerative disc disease with multilevel spinal stenosis most severe at the L3-L4 level with disc material seen herniating into the right L3-L4 neural foramen. There is also severe spinal stenosis at this level. She has a lower level bilateral facet spondylosis.  Impression and Recommendations:   This case required medical decision making of moderate complexity.

## 2012-04-01 ENCOUNTER — Ambulatory Visit: Payer: Medicare HMO | Attending: Sports Medicine | Admitting: Physical Therapy

## 2012-04-01 DIAGNOSIS — IMO0001 Reserved for inherently not codable concepts without codable children: Secondary | ICD-10-CM | POA: Insufficient documentation

## 2012-04-01 DIAGNOSIS — M25559 Pain in unspecified hip: Secondary | ICD-10-CM | POA: Insufficient documentation

## 2012-04-01 DIAGNOSIS — M545 Low back pain, unspecified: Secondary | ICD-10-CM | POA: Insufficient documentation

## 2012-04-04 ENCOUNTER — Ambulatory Visit: Payer: Medicare HMO

## 2012-04-07 ENCOUNTER — Ambulatory Visit: Payer: Medicare HMO | Admitting: Family Medicine

## 2012-04-11 ENCOUNTER — Ambulatory Visit: Payer: Medicare HMO | Admitting: Rehabilitation

## 2012-04-14 ENCOUNTER — Ambulatory Visit: Payer: Medicare HMO | Attending: Sports Medicine | Admitting: Rehabilitation

## 2012-04-14 DIAGNOSIS — M545 Low back pain, unspecified: Secondary | ICD-10-CM | POA: Insufficient documentation

## 2012-04-14 DIAGNOSIS — M25559 Pain in unspecified hip: Secondary | ICD-10-CM | POA: Insufficient documentation

## 2012-04-14 DIAGNOSIS — IMO0001 Reserved for inherently not codable concepts without codable children: Secondary | ICD-10-CM | POA: Insufficient documentation

## 2012-04-15 ENCOUNTER — Ambulatory Visit (INDEPENDENT_AMBULATORY_CARE_PROVIDER_SITE_OTHER): Payer: Medicare HMO | Admitting: Family Medicine

## 2012-04-15 ENCOUNTER — Encounter: Payer: Self-pay | Admitting: Family Medicine

## 2012-04-15 VITALS — BP 149/68 | HR 87 | Ht 66.0 in | Wt 198.0 lb

## 2012-04-15 DIAGNOSIS — Z720 Tobacco use: Secondary | ICD-10-CM

## 2012-04-15 DIAGNOSIS — F17200 Nicotine dependence, unspecified, uncomplicated: Secondary | ICD-10-CM

## 2012-04-15 DIAGNOSIS — J449 Chronic obstructive pulmonary disease, unspecified: Secondary | ICD-10-CM

## 2012-04-15 LAB — PULMONARY FUNCTION TEST

## 2012-04-15 MED ORDER — ALBUTEROL SULFATE HFA 108 (90 BASE) MCG/ACT IN AERS: 2.0000 | INHALATION_SPRAY | Freq: Four times a day (QID) | RESPIRATORY_TRACT | Status: DC | PRN
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
## 2012-04-22 ENCOUNTER — Ambulatory Visit: Payer: Medicare HMO | Admitting: Rehabilitation

## 2012-04-24 ENCOUNTER — Ambulatory Visit (INDEPENDENT_AMBULATORY_CARE_PROVIDER_SITE_OTHER): Payer: Medicare HMO | Admitting: Sports Medicine

## 2012-04-24 ENCOUNTER — Telehealth: Payer: Self-pay | Admitting: *Deleted

## 2012-04-24 ENCOUNTER — Ambulatory Visit: Payer: Medicare HMO | Admitting: Physical Therapy

## 2012-04-24 ENCOUNTER — Encounter: Payer: Self-pay | Admitting: Sports Medicine

## 2012-04-24 VITALS — BP 126/68 | HR 74 | Wt 204.0 lb

## 2012-04-24 DIAGNOSIS — M51369 Other intervertebral disc degeneration, lumbar region without mention of lumbar back pain or lower extremity pain: Secondary | ICD-10-CM

## 2012-04-24 DIAGNOSIS — M5136 Other intervertebral disc degeneration, lumbar region: Secondary | ICD-10-CM

## 2012-04-24 DIAGNOSIS — M5126 Other intervertebral disc displacement, lumbar region: Secondary | ICD-10-CM

## 2012-04-24 DIAGNOSIS — M51379 Other intervertebral disc degeneration, lumbosacral region without mention of lumbar back pain or lower extremity pain: Secondary | ICD-10-CM

## 2012-04-24 DIAGNOSIS — M5137 Other intervertebral disc degeneration, lumbosacral region: Secondary | ICD-10-CM

## 2012-04-24 MED ORDER — HYDROCODONE-ACETAMINOPHEN 5-325 MG PO TABS: 0.5000 | ORAL_TABLET | Freq: Three times a day (TID) | ORAL | Status: DC | PRN

## 2012-04-24 NOTE — Assessment & Plan Note (Signed)
With an acute L3-L4 right disc herniation. Overall improved with conservative measures however still having significant pain. She like to continue with conservative measures for now, I will add some hydrocodone and ordered MRI for future interventional planning. Return in one month.

## 2012-04-24 NOTE — Progress Notes (Signed)
  Subjective:    CC: Followup  HPI: Laura Ellison has an acute right L3-L4 disc herniation with visible disc material in the right L3-L4 neural foramen on CT scan. She has been undergoing conservative measures and is significantly better. Unfortunately she still does note some pain in the back radiating around to the anterior thigh. She does not desire to greatly change her management at this point, she feels that the weather is interfering and once the weather stabilizes her symptoms will improve. She continues to deny any new bowel or bladder changes. Symptoms continue to be moderate. Radiate as above. She did use some of her husband's hydrocodone and would like to try this for the next month before pursuing further measures.  Past medical history, Surgical history, Family history not pertinant except as noted below, Social history, Allergies, and medications have been entered into the medical record, reviewed, and no changes needed.   Review of Systems: No headache, visual changes, nausea, vomiting, diarrhea, constipation, dizziness, abdominal pain, skin rash, fevers, chills, night sweats, weight loss, swollen lymph nodes, body aches, joint swelling, muscle aches, chest pain, shortness of breath, mood changes, visual or auditory hallucinations.   Objective:   General: Well Developed, well nourished, and in no acute distress.  Neuro/Psych: Alert and oriented x3, extra-ocular muscles intact, able to move all 4 extremities, sensation grossly intact. Skin: Warm and dry, no rashes noted.  Respiratory: Not using accessory muscles, speaking in full sentences, trachea midline.  Cardiovascular: Pulses palpable, no extremity edema. Abdomen: Does not appear distended. Impression and Recommendations:   This case required medical decision making of moderate complexity.

## 2012-04-24 NOTE — Telephone Encounter (Signed)
Prior auth obtained for MRI Lumbar Spine w/o contrast to be scheduled at [facility]. Auth # [id number] good till 07/23/12. Linda notified. Barry Dienes, LPN

## 2012-04-26 ENCOUNTER — Emergency Department (INDEPENDENT_AMBULATORY_CARE_PROVIDER_SITE_OTHER)
Admission: EM | Admit: 2012-04-26 | Discharge: 2012-04-26 | Disposition: A | Discharge status: Home / Self Care | Payer: Medicare HMO | Source: Home / Self Care | Attending: Family Medicine | Admitting: Family Medicine

## 2012-04-26 ENCOUNTER — Ambulatory Visit (HOSPITAL_BASED_OUTPATIENT_CLINIC_OR_DEPARTMENT_OTHER)
Admission: RE | Admit: 2012-04-26 | Discharge: 2012-04-26 | Disposition: A | Discharge status: Home / Self Care | Payer: Medicare HMO | Source: Ambulatory Visit | Attending: Sports Medicine | Admitting: Sports Medicine

## 2012-04-26 DIAGNOSIS — M5126 Other intervertebral disc displacement, lumbar region: Secondary | ICD-10-CM

## 2012-04-26 DIAGNOSIS — M4120 Other idiopathic scoliosis, site unspecified: Secondary | ICD-10-CM | POA: Insufficient documentation

## 2012-04-26 DIAGNOSIS — M51379 Other intervertebral disc degeneration, lumbosacral region without mention of lumbar back pain or lower extremity pain: Secondary | ICD-10-CM | POA: Insufficient documentation

## 2012-04-26 DIAGNOSIS — M5137 Other intervertebral disc degeneration, lumbosacral region: Secondary | ICD-10-CM | POA: Insufficient documentation

## 2012-04-26 DIAGNOSIS — J069 Acute upper respiratory infection, unspecified: Secondary | ICD-10-CM

## 2012-04-26 DIAGNOSIS — M47817 Spondylosis without myelopathy or radiculopathy, lumbosacral region: Secondary | ICD-10-CM | POA: Insufficient documentation

## 2012-04-26 MED ORDER — DOXYCYCLINE HYCLATE 100 MG PO CAPS: 100.0000 mg | ORAL_CAPSULE | Freq: Two times a day (BID) | ORAL | Status: DC

## 2012-04-26 NOTE — ED Notes (Signed)
States that sinus pressure and eye pressure started x 2 days ago, clear productive cough

## 2012-04-26 NOTE — ED Provider Notes (Signed)
History     CSN: [id number]  Arrival date & time 04/26/12  0902   First MD Initiated Contact with Patient 04/26/12 [phone]      Chief Complaint  Patient presents with  . Facial Pain       HPI Comments: Patient complains of 2 day history of headache, sore throat, swollen glands in neck, chills, earache, sneezing, nasal congestion, and cough.  The history is provided by the patient.    Past Medical History  Diagnosis Date  . Hypertension   . Hyperlipidemia   . thyroid problem     Past Surgical History  Procedure Laterality Date  . Cholecystectomy, laparoscopic  07/13/2010    Family History  Problem Relation Age of Onset  . Lymphoma Father   . Diabetes Maternal Grandmother   . Stroke Maternal Grandmother   . Aortic aneurysm Mother     died from  . Stroke Maternal Uncle   . Heart attack      Mothers family  . Aortic aneurysm Mother     Deceased    History  Substance Use Topics  . Smoking status: Current Every Day Smoker -- 2.00 packs/day for 50 years    Types: Cigarettes  . Smokeless tobacco: Not on file  . Alcohol Use: No    OB History   Grav Para Term Preterm Abortions TAB SAB Ect Mult Living                  Review of Systems + sore throat + cough No pleuritic pain No wheezing + nasal congestion + post-nasal drainage No sinus pain/pressure No itchy/red eyes, but + eye pressure + earache No hemoptysis No SOB No fever, + chills No nausea No vomiting No abdominal pain No diarrhea No urinary symptoms No skin rashes + fatigue No myalgias + headache Used OTC meds without relief  Allergies  Amoxicillin-pot clavulanate; Crestor; Fenofibrate; Lipitor; Other; Pravachol; Prednisone; Welchol; and Zocor  Home Medications   Current Outpatient Rx  Name  Route  Sig  Dispense  Refill  . albuterol (PROVENTIL) (2.5 MG/3ML) 0.083% nebulizer solution   Nebulization   Take 3 mLs (2.5 mg total) by nebulization every 4 (four) hours as needed for  wheezing.   75 mL   12   . albuterol (VENTOLIN HFA) 108 (90 BASE) MCG/ACT inhaler   Inhalation   Inhale 2 puffs into the lungs every 6 (six) hours as needed for wheezing.   1 Inhaler   2   . ALPRAZolam (XANAX) 0.25 MG tablet   Oral   Take 1 tablet (0.25 mg total) by mouth daily as needed for anxiety.   30 tablet   0   . Cholecalciferol (VITAMIN D3) 1000 UNITS CAPS   Oral   Take by mouth.           . cyanocobalamin 100 MCG tablet   Oral   Take 100 mcg by mouth daily.           Marland Kitchen doxycycline (VIBRAMYCIN) 100 MG capsule   Oral   Take 1 capsule (100 mg total) by mouth 2 (two) times daily.   20 capsule   0   . fexofenadine (ALLEGRA) 180 MG tablet   Oral   Take 180 mg by mouth daily.           Marland Kitchen gabapentin (NEURONTIN) 300 MG capsule      One tab PO qHS for a week, then BID for a week, then TID.  May increase to  a max of 4 tabs PO TID.   180 capsule   3   . HYDROcodone-acetaminophen (NORCO/VICODIN) 5-325 MG per tablet   Oral   Take 0.5 tablets by mouth every 8 (eight) hours as needed for pain.   40 tablet   0   . levocetirizine (XYZAL) 5 MG tablet   Oral   Take 1 tablet (5 mg total) by mouth every evening.   30 tablet   6   . losartan (COZAAR) 100 MG tablet   Oral   Take 1 tablet (100 mg total) by mouth daily.   30 tablet   5   . methimazole (TAPAZOLE) 10 MG tablet   Oral   Take 10 mg by mouth 3 (three) times daily.           . metoprolol tartrate (LOPRESSOR) 25 MG tablet   Oral   Take 1 tablet (25 mg total) by mouth 2 (two) times daily.   180 tablet   0   . Multiple Vitamins-Minerals (CENTRUM SILVER ULTRA WOMENS PO)   Oral   Take by mouth.           Marland Kitchen omeprazole (PRILOSEC) 40 MG capsule   Oral   Take 1 capsule (40 mg total) by mouth daily.   90 capsule   1   . Pitavastatin Calcium (LIVALO) 2 MG TABS   Oral   Take 1 tablet (2 mg total) by mouth at bedtime.   14 tablet   0     BP 135/76  Pulse 81  Temp(Src) 97.9 F (36.6 C)  (Oral)  Ht 5\' 6"  (1.676 m)  Wt 203 lb (92.08 kg)  BMI 32.78 kg/m2  SpO2 97%  Physical Exam Nursing notes and Vital Signs reviewed. Appearance:  Patient appears stated age, and in no acute distress.  Patient is obese (BMI 32.8) Eyes:  Pupils are equal, round, and reactive to light and accomodation.  Extraocular movement is intact.  Conjunctivae are not inflamed  Ears:  Canals normal.  Tympanic membranes normal.  Nose:  Mildly congested turbinates.  No sinus tenderness.  Pharynx:  Normal Neck:  Supple.   Tender shotty posterior nodes are palpated bilaterally  Lungs:   Few faint wheezes heard in upper chest. Breath sounds are equal.  Chest:  Distinct tenderness to palpation over the mid-sternum.  Heart:  Regular rate and rhythm without murmurs, rubs, or gallops.  Abdomen:  Nontender without masses or hepatosplenomegaly.  Bowel sounds are present.  No CVA or flank tenderness.  Extremities:  No edema.  No calf tenderness Skin:  No rash present.   ED Course  Procedures  none      1. Acute upper respiratory infections of unspecified site       MDM  Chart reviewed; CT chest done 2012 reveals emphysematous changes RUL. With a history of COPD, will begin doxycycline for 10 days. Take plain Mucinex (guaifenesin) twice daily for cough and congestion.  Increase fluid intake, rest. May use Afrin nasal spray (or generic oxymetazoline) twice daily for about 5 days.  Also recommend using saline nasal spray several times daily and saline nasal irrigation (AYR is a common brand) Stop all antihistamines for now, and other non-prescription cough/cold preparations. Continue albuterol inhaler and nebulizer as needed. May take Delsym Cough Suppressant, or Robitussin DM at bedtime for nighttime cough.  Follow-up with family doctor if not improving 7 to 10 days.         Lattie Haw, MD 04/30/12 [phone]

## 2012-04-29 ENCOUNTER — Ambulatory Visit: Payer: Medicare HMO | Admitting: Physical Therapy

## 2012-05-01 ENCOUNTER — Encounter: Payer: Self-pay | Admitting: Family Medicine

## 2012-05-02 ENCOUNTER — Encounter: Payer: Medicare HMO | Admitting: Physical Therapy

## 2012-05-13 ENCOUNTER — Other Ambulatory Visit: Payer: Self-pay | Admitting: Family Medicine

## 2012-05-20 ENCOUNTER — Other Ambulatory Visit: Payer: Self-pay | Admitting: Family Medicine

## 2012-05-21 ENCOUNTER — Ambulatory Visit: Payer: Medicare HMO | Admitting: Sports Medicine

## 2012-05-27 ENCOUNTER — Ambulatory Visit (INDEPENDENT_AMBULATORY_CARE_PROVIDER_SITE_OTHER): Payer: Medicare HMO | Admitting: Sports Medicine

## 2012-05-27 ENCOUNTER — Encounter: Payer: Self-pay | Admitting: Sports Medicine

## 2012-05-27 VITALS — BP 143/84 | HR 84 | Wt 200.0 lb

## 2012-05-27 DIAGNOSIS — M51379 Other intervertebral disc degeneration, lumbosacral region without mention of lumbar back pain or lower extremity pain: Secondary | ICD-10-CM

## 2012-05-27 DIAGNOSIS — M5126 Other intervertebral disc displacement, lumbar region: Secondary | ICD-10-CM

## 2012-05-27 DIAGNOSIS — M5137 Other intervertebral disc degeneration, lumbosacral region: Secondary | ICD-10-CM

## 2012-05-27 DIAGNOSIS — M51369 Other intervertebral disc degeneration, lumbar region without mention of lumbar back pain or lower extremity pain: Secondary | ICD-10-CM

## 2012-05-27 NOTE — Progress Notes (Signed)
   Subjective:    CC: MRI  HPI: This very pleasant 67 year old female comes in for followup of MRI, to recap, she twisted, cough, and had intense back pain radiating down her right anterior thigh and groin. She went to the emergency department where a CT scan showed some suspicion of L3-L4 degenerative disc disease. I saw her, initiated conservative measures, and obtained an MRI. Her pain has now resolved with home exercises, steroids, and occasional narcotic. She's very happy with the results. No constitutional symptoms or bowel or bladder dysfunction.  Past medical history, Surgical history, Family history not pertinant except as noted below, Social history, Allergies, and medications have been entered into the medical record, reviewed, and no changes needed.   Review of Systems: No headache, visual changes, nausea, vomiting, diarrhea, constipation, dizziness, abdominal pain, skin rash, fevers, chills, night sweats, weight loss, swollen lymph nodes, body aches, joint swelling, muscle aches, chest pain, shortness of breath, mood changes, visual or auditory hallucinations.   Objective:   General: Well Developed, well nourished, and in no acute distress.  Neuro/Psych: Alert and oriented x3, extra-ocular muscles intact, able to move all 4 extremities, sensation grossly intact. Skin: Warm and dry, no rashes noted.  Respiratory: Not using accessory muscles, speaking in full sentences, trachea midline.  Cardiovascular: Pulses palpable, no extremity edema. Abdomen: Does not appear distended.  MRI was reviewed personally, there is multilevel degenerative disc disease, the worst level was at the L3-L4 level where there is a large disc extrusion on the right side.  Impression and Recommendations:   This case required medical decision making of moderate complexity.

## 2012-05-27 NOTE — Assessment & Plan Note (Signed)
Essentially pain-free with conservative measures. MRI does show multilevel degenerative disc disease with a large extruded disc at the L3-L4 level likely affecting the right L3 nerve root which corresponds to her symptoms. If her symptoms recur we certainly could proceed with interventional treatment, I would however initially recommend a repeat course of conservative measures. She can come back on an as-needed basis to see me.

## 2012-07-07 ENCOUNTER — Other Ambulatory Visit: Payer: Self-pay | Admitting: Family Medicine

## 2012-07-09 ENCOUNTER — Ambulatory Visit (INDEPENDENT_AMBULATORY_CARE_PROVIDER_SITE_OTHER): Payer: Medicare HMO | Admitting: Sports Medicine

## 2012-07-09 ENCOUNTER — Ambulatory Visit (INDEPENDENT_AMBULATORY_CARE_PROVIDER_SITE_OTHER): Payer: Medicare HMO

## 2012-07-09 ENCOUNTER — Encounter: Payer: Self-pay | Admitting: Sports Medicine

## 2012-07-09 VITALS — BP 155/79 | HR 87 | Wt 200.0 lb

## 2012-07-09 DIAGNOSIS — M5412 Radiculopathy, cervical region: Secondary | ICD-10-CM

## 2012-07-09 DIAGNOSIS — M4802 Spinal stenosis, cervical region: Secondary | ICD-10-CM | POA: Insufficient documentation

## 2012-07-09 DIAGNOSIS — M542 Cervicalgia: Secondary | ICD-10-CM

## 2012-07-09 MED ORDER — PREDNISONE 50 MG PO TABS: ORAL_TABLET | ORAL | Status: DC

## 2012-07-09 MED ORDER — HYDROCODONE-ACETAMINOPHEN 10-325 MG PO TABS: 1.0000 | ORAL_TABLET | Freq: Three times a day (TID) | ORAL | Status: DC | PRN

## 2012-07-09 MED ORDER — CYCLOBENZAPRINE HCL 10 MG PO TABS: ORAL_TABLET | ORAL | Status: DC

## 2012-07-09 NOTE — Progress Notes (Signed)
  Subjective:    CC: Neck pain  HPI: Laura Ellison is a very pleasant 67 year old female who comes in with a couple week history of pain which he localizes in her neck, radiating down the posterior aspect of her left arm causing numbness and tingling in her fourth and fifth fingers of the left hand. She recalls making rosaries, and spending hours with her neck flexed. Pain is severe, stable.  Past medical history, Surgical history, Family history not pertinant except as noted below, Social history, Allergies, and medications have been entered into the medical record, reviewed, and no changes needed.   Review of Systems: No fevers, chills, night sweats, weight loss, chest pain, or shortness of breath.   Objective:    General: Well Developed, well nourished, and in no acute distress.  Neuro: Alert and oriented x3, extra-ocular muscles intact, sensation grossly intact.  HEENT: Normocephalic, atraumatic, pupils equal round reactive to light, neck supple, no masses, no lymphadenopathy, thyroid nonpalpable.  Skin: Warm and dry, no rashes. Cardiac: Regular rate and rhythm, no murmurs rubs or gallops, no lower extremity edema.  Respiratory: Clear to auscultation bilaterally. Not using accessory muscles, speaking in full sentences. Neck: Inspection unremarkable. No palpable stepoffs. Positive Spurling's maneuver. Full neck range of motion Grip strength and sensation normal in bilateral hands Strength good C4 to T1 distribution No sensory change to C4 to T1 Negative Hoffman sign bilaterally Reflexes normal  Impression and Recommendations:

## 2012-07-09 NOTE — Assessment & Plan Note (Signed)
Left-sided C8. Prednisone, Flexeril, home exercises, x-rays. Discontinue gabapentin. Increasing hydrocodone to 10. Return in 4 weeks, MRI if no better.

## 2012-07-17 ENCOUNTER — Telehealth: Payer: Self-pay | Admitting: Family Medicine

## 2012-07-17 MED ORDER — BUPROPION HCL ER (SR) 150 MG PO TB12: 150.0000 mg | ORAL_TABLET | Freq: Two times a day (BID) | ORAL | Status: DC

## 2012-07-17 NOTE — Telephone Encounter (Signed)
Would like to start Wellbutrin for smoking cessation. Hx COPD. Rx sent.

## 2012-07-19 ENCOUNTER — Emergency Department (HOSPITAL_BASED_OUTPATIENT_CLINIC_OR_DEPARTMENT_OTHER)
Admission: EM | Admit: 2012-07-19 | Discharge: 2012-07-19 | Disposition: A | Discharge status: Home / Self Care | Payer: Medicare HMO | Attending: Emergency Medicine | Admitting: Emergency Medicine

## 2012-07-19 ENCOUNTER — Encounter (HOSPITAL_BASED_OUTPATIENT_CLINIC_OR_DEPARTMENT_OTHER): Payer: Self-pay | Admitting: Emergency Medicine

## 2012-07-19 DIAGNOSIS — G8929 Other chronic pain: Secondary | ICD-10-CM | POA: Insufficient documentation

## 2012-07-19 DIAGNOSIS — E785 Hyperlipidemia, unspecified: Secondary | ICD-10-CM | POA: Insufficient documentation

## 2012-07-19 DIAGNOSIS — F17200 Nicotine dependence, unspecified, uncomplicated: Secondary | ICD-10-CM | POA: Insufficient documentation

## 2012-07-19 DIAGNOSIS — Z8639 Personal history of other endocrine, nutritional and metabolic disease: Secondary | ICD-10-CM | POA: Insufficient documentation

## 2012-07-19 DIAGNOSIS — R209 Unspecified disturbances of skin sensation: Secondary | ICD-10-CM | POA: Insufficient documentation

## 2012-07-19 DIAGNOSIS — Z8669 Personal history of other diseases of the nervous system and sense organs: Secondary | ICD-10-CM | POA: Insufficient documentation

## 2012-07-19 DIAGNOSIS — Z79899 Other long term (current) drug therapy: Secondary | ICD-10-CM | POA: Insufficient documentation

## 2012-07-19 DIAGNOSIS — M549 Dorsalgia, unspecified: Secondary | ICD-10-CM | POA: Insufficient documentation

## 2012-07-19 DIAGNOSIS — I10 Essential (primary) hypertension: Secondary | ICD-10-CM | POA: Insufficient documentation

## 2012-07-19 DIAGNOSIS — Z862 Personal history of diseases of the blood and blood-forming organs and certain disorders involving the immune mechanism: Secondary | ICD-10-CM | POA: Insufficient documentation

## 2012-07-19 HISTORY — DX: Injury of unspecified nerves of neck, initial encounter: S14.9XXA

## 2012-07-19 HISTORY — DX: Mononeuropathy, unspecified: G58.9

## 2012-07-19 MED ORDER — HYDROMORPHONE HCL PF 2 MG/ML IJ SOLN
2.0000 mg | Freq: Once | INTRAMUSCULAR | Status: AC
  Administered 2012-07-19: 2 mg via INTRAMUSCULAR
  Filled 2012-07-19: qty 1

## 2012-07-19 MED ORDER — OXYCODONE-ACETAMINOPHEN 5-325 MG PO TABS: 2.0000 | ORAL_TABLET | ORAL | Status: DC | PRN

## 2012-07-19 NOTE — ED Notes (Signed)
Pt states she is being followed by Dr. Benjamin Stain for pinched nerve in cervical area.  Pt had tens treatment last night and now pain to scapula area has become unbearable.  Pt admits to numbness and tingling in left arm and hand.

## 2012-07-19 NOTE — ED Provider Notes (Signed)
History     CSN: [id number]  Arrival date & time 07/19/12  1247   First MD Initiated Contact with Patient 07/19/12 1254      Chief Complaint  Patient presents with  . Back Pain  . Numbness    (Consider location/radiation/quality/duration/timing/severity/associated sxs/prior treatment) HPI Comments: Patient with history of degenerative disc disease followed by an orthopedist in [place].  Presents with complaints of an exacerbation of her pain in the middle of her back.  There is no radiation of the pain to the legs and there are no bowel or bladder complaints.  She tried to use her TENS unit last night but that only made it worse.    Patient is a 67 y.o. female presenting with back pain. The history is provided by the patient.  Back Pain Location:  Thoracic spine Quality:  Stabbing Radiates to:  Does not radiate Pain severity:  Severe Pain is:  Same all the time Onset quality:  Gradual Duration:  3 days Timing:  Constant Progression:  Worsening Chronicity:  New Relieved by:  Nothing Worsened by:  Movement and palpation Ineffective treatments: hydrocodone, TENS unit. Associated symptoms: no abdominal pain, no bladder incontinence, no bowel incontinence, no leg pain, no numbness and no weakness     Past Medical History  Diagnosis Date  . Hypertension   . Hyperlipidemia   . thyroid problem   . Pinched nerve in neck     Past Surgical History  Procedure Laterality Date  . Cholecystectomy, laparoscopic  07/13/2010    Family History  Problem Relation Age of Onset  . Lymphoma Father   . Diabetes Maternal Grandmother   . Stroke Maternal Grandmother   . Aortic aneurysm Mother     died from  . Stroke Maternal Uncle   . Heart attack      Mothers family  . Aortic aneurysm Mother     Deceased    History  Substance Use Topics  . Smoking status: Current Every Day Smoker -- 2.00 packs/day for 50 years    Types: Cigarettes  . Smokeless tobacco: Not on file  .  Alcohol Use: No    OB History   Grav Para Term Preterm Abortions TAB SAB Ect Mult Living                  Review of Systems  Gastrointestinal: Negative for abdominal pain and bowel incontinence.  Genitourinary: Negative for bladder incontinence.  Musculoskeletal: Positive for back pain.  Neurological: Negative for weakness and numbness.  All other systems reviewed and are negative.    Allergies  Amoxicillin-pot clavulanate; Crestor; Fenofibrate; Lipitor; Other; Pravachol; Prednisone; Welchol; and Zocor  Home Medications   Current Outpatient Rx  Name  Route  Sig  Dispense  Refill  . albuterol (PROVENTIL) (2.5 MG/3ML) 0.083% nebulizer solution   Nebulization   Take 3 mLs (2.5 mg total) by nebulization every 4 (four) hours as needed for wheezing.   75 mL   12   . albuterol (VENTOLIN HFA) 108 (90 BASE) MCG/ACT inhaler   Inhalation   Inhale 2 puffs into the lungs every 6 (six) hours as needed for wheezing.   1 Inhaler   2   . ALPRAZolam (XANAX) 0.25 MG tablet   Oral   Take 1 tablet (0.25 mg total) by mouth daily as needed for anxiety.   30 tablet   0   . buPROPion (WELLBUTRIN SR) 150 MG 12 hr tablet   Oral   Take 1 tablet (150  mg total) by mouth 2 (two) times daily.   60 tablet   2   . Cholecalciferol (VITAMIN D3) 1000 UNITS CAPS   Oral   Take by mouth.           . cyanocobalamin 100 MCG tablet   Oral   Take 100 mcg by mouth daily.           . cyclobenzaprine (FLEXERIL) 10 MG tablet      One half tab PO qHS, then increase gradually to one tab TID.   30 tablet   0   . doxycycline (VIBRAMYCIN) 100 MG capsule   Oral   Take 1 capsule (100 mg total) by mouth 2 (two) times daily.   20 capsule   0   . fexofenadine (ALLEGRA) 180 MG tablet   Oral   Take 180 mg by mouth daily.           Marland Kitchen HYDROcodone-acetaminophen (NORCO) 10-325 MG per tablet   Oral   Take 1 tablet by mouth every 8 (eight) hours as needed for pain.   40 tablet   0   . EXPIRED:  levocetirizine (XYZAL) 5 MG tablet   Oral   Take 1 tablet (5 mg total) by mouth every evening.   30 tablet   6   . losartan (COZAAR) 100 MG tablet   Oral   Take 1 tablet (100 mg total) by mouth daily.   30 tablet   5   . methimazole (TAPAZOLE) 10 MG tablet   Oral   Take 10 mg by mouth 3 (three) times daily.           . metoprolol tartrate (LOPRESSOR) 25 MG tablet      TAKE 1 TABLET BY MOUTH TWICE DAILY   180 tablet   0   . Multiple Vitamins-Minerals (CENTRUM SILVER ULTRA WOMENS PO)   Oral   Take by mouth.           Marland Kitchen omeprazole (PRILOSEC) 40 MG capsule      TAKE ONE CAPSULE BY MOUTH DAILY   90 capsule   0   . Pitavastatin Calcium (LIVALO) 2 MG TABS   Oral   Take 1 tablet (2 mg total) by mouth at bedtime.   14 tablet   0   . predniSONE (DELTASONE) 50 MG tablet      One tab PO daily for 5 days.   5 tablet   0     BP 160/77  Pulse 83  Temp(Src) 98 F (36.7 C) (Oral)  Resp 24  Ht 5\' 6"  (1.676 m)  Wt 200 lb (90.719 kg)  BMI 32.3 kg/m2  SpO2 99%  Physical Exam  Nursing note and vitals reviewed. Constitutional: She is oriented to person, place, and time. She appears well-developed and well-nourished. No distress.  HENT:  Head: Normocephalic and atraumatic.  Mouth/Throat: Oropharynx is clear and moist.  Neck: Normal range of motion. Neck supple.  Cardiovascular: Normal rate and regular rhythm.   No murmur heard. Pulmonary/Chest: Effort normal and breath sounds normal. No respiratory distress. She has no wheezes.  Abdominal: Soft. Bowel sounds are normal. She exhibits no distension. There is no tenderness.  Musculoskeletal: Normal range of motion.  There is ttp in the thoracic spine in the area between her shoulder blades.    Neurological: She is alert and oriented to person, place, and time.  Strength is 5/5 in the ble.  DTR's are 2+ and equal in the patellar and achilles.  Skin: Skin is warm and dry. She is not diaphoretic.    ED Course   Procedures (including critical care time)  Labs Reviewed - No data to display No results found.   No diagnosis found.    MDM  Feels better with meds given.  No signs or symptoms of emergently surgical pathology.  Will discharge with pain meds, return prn.        Geoffery Lyons, MD 07/19/12 [phone]

## 2012-07-22 ENCOUNTER — Encounter: Payer: Self-pay | Admitting: Sports Medicine

## 2012-07-22 ENCOUNTER — Ambulatory Visit (INDEPENDENT_AMBULATORY_CARE_PROVIDER_SITE_OTHER): Payer: Medicare HMO | Admitting: Sports Medicine

## 2012-07-22 ENCOUNTER — Telehealth: Payer: Self-pay | Admitting: *Deleted

## 2012-07-22 VITALS — BP 142/79 | HR 83 | Wt 193.5 lb

## 2012-07-22 DIAGNOSIS — M5412 Radiculopathy, cervical region: Secondary | ICD-10-CM

## 2012-07-22 MED ORDER — HYDROMORPHONE HCL 2 MG PO TABS: ORAL_TABLET | ORAL | Status: DC

## 2012-07-22 NOTE — Progress Notes (Signed)
  Subjective:    CC: Followup  HPI: Cervical radiculitis: Improved somewhat with oral medications, unfortunately, still has persistent symptoms going into the left C8 distribution despite home exercises.  Currently the hydrocodone is ineffective, she did take some Percocet which made her sick. Symptoms are moderate, persistent.  Past medical history, Surgical history, Family history not pertinant except as noted below, Social history, Allergies, and medications have been entered into the medical record, reviewed, and no changes needed.   Review of Systems: No fevers, chills, night sweats, weight loss, chest pain, or shortness of breath.   Objective:    General: Well Developed, well nourished, and in no acute distress.  Neuro: Alert and oriented x3, extra-ocular muscles intact, sensation grossly intact.  HEENT: Normocephalic, atraumatic, pupils equal round reactive to light, neck supple, no masses, no lymphadenopathy, thyroid nonpalpable.  Skin: Warm and dry, no rashes. Cardiac: Regular rate and rhythm, no murmurs rubs or gallops, no lower extremity edema.  Respiratory: Clear to auscultation bilaterally. Not using accessory muscles, speaking in full sentences. Neck: Inspection unremarkable. No palpable stepoffs. Negative Spurling's maneuver. Full neck range of motion Grip strength and sensation normal in bilateral hands Strength good C4 to T1 distribution No sensory change to C4 to T1 Negative Hoffman sign bilaterally Reflexes normal Impression and Recommendations:

## 2012-07-22 NOTE — Telephone Encounter (Signed)
[facility] and gave her the PA# for pt's mri [id number] asked her if she would like for me to fax this to her she declined.Loralee Pacas Mullan

## 2012-07-22 NOTE — Assessment & Plan Note (Addendum)
Left C8 radiculitis. Failed conservative measures so far, her husband is going in for large back surgery in about 3 weeks. We will get aggressive with an MRI of the cervical spine, formal physical therapy at [facility], the MRI is for interventional injection planning. I will see her back to go over the MRI results. Dilaudid for pain.

## 2012-07-22 NOTE — Telephone Encounter (Signed)
Prior auth obtained for MRI C-spine WO.  auth # is [id number]. Imaging in [facility] notified.

## 2012-07-26 ENCOUNTER — Ambulatory Visit (HOSPITAL_BASED_OUTPATIENT_CLINIC_OR_DEPARTMENT_OTHER)
Admission: RE | Admit: 2012-07-26 | Discharge: 2012-07-26 | Disposition: A | Discharge status: Home / Self Care | Payer: Medicare HMO | Source: Ambulatory Visit | Attending: Sports Medicine | Admitting: Sports Medicine

## 2012-07-26 DIAGNOSIS — M542 Cervicalgia: Secondary | ICD-10-CM | POA: Insufficient documentation

## 2012-07-26 DIAGNOSIS — M5020 Other cervical disc displacement, unspecified cervical region: Secondary | ICD-10-CM | POA: Insufficient documentation

## 2012-07-26 DIAGNOSIS — M5412 Radiculopathy, cervical region: Secondary | ICD-10-CM

## 2012-07-26 DIAGNOSIS — M47812 Spondylosis without myelopathy or radiculopathy, cervical region: Secondary | ICD-10-CM | POA: Insufficient documentation

## 2012-07-31 ENCOUNTER — Encounter: Payer: Self-pay | Admitting: Sports Medicine

## 2012-07-31 ENCOUNTER — Ambulatory Visit (INDEPENDENT_AMBULATORY_CARE_PROVIDER_SITE_OTHER): Payer: Medicare HMO | Admitting: Sports Medicine

## 2012-07-31 VITALS — BP 139/71 | HR 88 | Wt 196.0 lb

## 2012-07-31 DIAGNOSIS — M4802 Spinal stenosis, cervical region: Secondary | ICD-10-CM

## 2012-07-31 MED ORDER — PREDNISONE 10 MG PO TABS: ORAL_TABLET | ORAL | Status: DC

## 2012-07-31 MED ORDER — ALPRAZOLAM 0.25 MG PO TABS: 0.2500 mg | ORAL_TABLET | Freq: Every day | ORAL | Status: DC | PRN

## 2012-07-31 NOTE — Progress Notes (Signed)
  Subjective:    CC: Followup  HPI: Left C7 radiculitis: Laura Ellison has done home exercises, on his own, NSAIDs, muscle relaxers, unfortunately she continued to have pain in her neck, worse with flexion, and radiating pain down her left arm. She's also noted some abnormalities in her gait, and difficulty balancing. For MRI this reason, I obtained an early MRI  the results of which will be dictated below.  symptoms are persistent, moderate to severe. Currently no bowel or bladder dysfunction, or saddle anesthesia. No constitutional symptoms.   Past medical history, Surgical history, Family history not pertinant except as noted below, Social history, Allergies, and medications have been entered into the medical record, reviewed, and no changes needed.   Review of Systems: No fevers, chills, night sweats, weight loss, chest pain, or shortness of breath.   Objective:    General: Well Developed, well nourished, and in no acute distress.  Neuro: Alert and oriented x3, extra-ocular muscles intact, sensation grossly intact.  HEENT: Normocephalic, atraumatic, pupils equal round reactive to light, neck supple, no masses, no lymphadenopathy, thyroid nonpalpable.  Skin: Warm and dry, no rashes. Cardiac: Regular rate and rhythm, no murmurs rubs or gallops, no lower extremity edema.  Respiratory: Clear to auscultation bilaterally. Not using accessory muscles, speaking in full sentences. Neck: Inspection unremarkable. No palpable stepoffs. Negative Spurling's maneuver. Full neck range of motion Grip strength and sensation normal in bilateral hands Strength good C4 to T1 distribution No sensory change to C4 to T1 Negative Hoffman sign bilaterally Reflexes normal  MRI shows fairly severe spinal stenosis at the C4-C5 level. There also appears to be some myelomalacia on MRI. There is other pathology at lower levels.  Impression and Recommendations:

## 2012-07-31 NOTE — Assessment & Plan Note (Signed)
There is fairly severe cervical spinal stenosis C4-C5 region with possible myelomalacia on MRI. I'm adding more prednisone, at this point I would like her to see Dr. Yevette Edwards with spine surgery. Unlikely to give her any better with epidural injections. I recommend she avoid physical therapy and chiropractic care in the meantime considering the degree of spinal stenosis. I am going to give her some Atarax as well in the meantime.

## 2012-08-05 ENCOUNTER — Ambulatory Visit (INDEPENDENT_AMBULATORY_CARE_PROVIDER_SITE_OTHER): Payer: Medicare HMO | Admitting: Sports Medicine

## 2012-08-05 ENCOUNTER — Encounter: Payer: Self-pay | Admitting: Sports Medicine

## 2012-08-05 VITALS — BP 137/72 | HR 80 | Wt 197.0 lb

## 2012-08-05 DIAGNOSIS — M7712 Lateral epicondylitis, left elbow: Secondary | ICD-10-CM | POA: Insufficient documentation

## 2012-08-05 DIAGNOSIS — IMO0002 Reserved for concepts with insufficient information to code with codable children: Secondary | ICD-10-CM | POA: Insufficient documentation

## 2012-08-05 DIAGNOSIS — M65839 Other synovitis and tenosynovitis, unspecified forearm: Secondary | ICD-10-CM

## 2012-08-05 DIAGNOSIS — M7710 Lateral epicondylitis, unspecified elbow: Secondary | ICD-10-CM

## 2012-08-05 DIAGNOSIS — M4802 Spinal stenosis, cervical region: Secondary | ICD-10-CM

## 2012-08-05 DIAGNOSIS — M65849 Other synovitis and tenosynovitis, unspecified hand: Secondary | ICD-10-CM

## 2012-08-05 DIAGNOSIS — IMO0001 Reserved for inherently not codable concepts without codable children: Secondary | ICD-10-CM

## 2012-08-05 NOTE — Progress Notes (Signed)
Subjective:    CC: Left wrist and elbow pain  HPI: This is a very pleasant 67 year old female who for the past few weeks has noted pain which he localizes at the ulnar aspect of her left wrist, radiating up the lateral aspect of the forearm to the lateral epicondyle.  Pain is moderate, worse with gripping, worse with ulnar deviation of her wrist. She also gets numbness and tingling going into her fourth and fifth fingers. She has known cervical degenerative disc disease with spinal stenosis it is fairly severe.  Past medical history, Surgical history, Family history not pertinant except as noted below, Social history, Allergies, and medications have been entered into the medical record, reviewed, and no changes needed.   Review of Systems: No fevers, chills, night sweats, weight loss, chest pain, or shortness of breath.   Objective:    General: Well Developed, well nourished, and in no acute distress.  Neuro: Alert and oriented x3, extra-ocular muscles intact, sensation grossly intact.  HEENT: Normocephalic, atraumatic, pupils equal round reactive to light, neck supple, no masses, no lymphadenopathy, thyroid nonpalpable.  Skin: Warm and dry, no rashes. Cardiac: Regular rate and rhythm, no murmurs rubs or gallops, no lower extremity edema.  Respiratory: Clear to auscultation bilaterally. Not using accessory muscles, speaking in full sentences. Left Wrist: Inspection normal with no visible erythema or swelling. ROM smooth and normal with good flexion and extension and ulnar/radial deviation that is symmetrical with opposite wrist. Minimal tenderness to palpation of the sixth extensor compartment. There is also some tenderness to palpation over the proximal flexor carpi ulnaris. No snuffbox tenderness. No tenderness over Canal of Guyon. Strength 5/5 in all directions without pain. Negative Finkelstein, tinel's and phalens. Negative Watson's test. Left Elbow: Unremarkable to  inspection. Range of motion full pronation, supination, flexion, extension. Strength is full to all of the above directions Stable to varus, valgus stress. Negative moving valgus stress test. Tender to palpation over the lateral epicondyle. Ulnar nerve does not sublux. Negative cubital tunnel Tinel's.  Procedure: Real-time Ultrasound Guided Injection of left flexor carpi ulnaris. Device: GE Logiq E  Verbal informed consent obtained.  Time-out conducted.  Noted no overlying erythema, induration, or other signs of local infection.  Skin prepped in a sterile fashion.  Local anesthesia: Topical Ethyl chloride.  With sterile technique and under real time ultrasound guidance:  1 cc Kenalog 40, 3 cc lidocaine injected easily. Completed without difficulty  Pain immediately resolved suggesting accurate placement of the medication.  Advised to call if fevers/chills, erythema, induration, drainage, or persistent bleeding.  Images permanently stored and available for review in the ultrasound unit.  Impression: Technically successful ultrasound guided injection.  Procedure: Real-time Ultrasound Guided Injection of left common extensor tendon origin. Device: GE Logiq E  Verbal informed consent obtained.  Time-out conducted.  Noted no overlying erythema, induration, or other signs of local infection.  Skin prepped in a sterile fashion.  Local anesthesia: Topical Ethyl chloride.  With sterile technique and under real time ultrasound guidance:  25-gauge needle advanced just deep to the common extensor tendon origin of the lateral upper condyle, 1 cc Kenalog 40, 3cc lidocaine injected easily in a fanlike pattern, deep to, and superficial to the tendon. Completed without difficulty  Pain immediately resolved suggesting accurate placement of the medication.  Advised to call if fevers/chills, erythema, induration, drainage, or persistent bleeding.  Images permanently stored and available for review in  the ultrasound unit.  Impression: Technically successful ultrasound guided injection.  Elbow was  strapped with a counterforce breakthrough  Impression and Recommendations:

## 2012-08-05 NOTE — Assessment & Plan Note (Signed)
Ultrasound guided injection as above. Continue home exercises. Return in one month for this.

## 2012-08-05 NOTE — Assessment & Plan Note (Signed)
Still has not heard back from Dr. Yevette Edwards regarding her severe cervical spinal stenosis.

## 2012-08-05 NOTE — Assessment & Plan Note (Signed)
Ultrasound-guided injection. Tennis elbow brace. Home exercises.

## 2012-08-06 ENCOUNTER — Ambulatory Visit: Payer: Medicare HMO | Admitting: Sports Medicine

## 2012-08-07 ENCOUNTER — Ambulatory Visit: Payer: Medicare HMO | Admitting: Sports Medicine

## 2012-09-03 ENCOUNTER — Encounter: Payer: Self-pay | Admitting: Sports Medicine

## 2012-09-03 ENCOUNTER — Ambulatory Visit (INDEPENDENT_AMBULATORY_CARE_PROVIDER_SITE_OTHER): Payer: Medicare HMO | Admitting: Sports Medicine

## 2012-09-03 VITALS — BP 162/75 | HR 73 | Wt 200.0 lb

## 2012-09-03 DIAGNOSIS — M5137 Other intervertebral disc degeneration, lumbosacral region: Secondary | ICD-10-CM

## 2012-09-03 DIAGNOSIS — M4802 Spinal stenosis, cervical region: Secondary | ICD-10-CM

## 2012-09-03 DIAGNOSIS — M5126 Other intervertebral disc displacement, lumbar region: Secondary | ICD-10-CM

## 2012-09-03 DIAGNOSIS — M7712 Lateral epicondylitis, left elbow: Secondary | ICD-10-CM

## 2012-09-03 DIAGNOSIS — M51379 Other intervertebral disc degeneration, lumbosacral region without mention of lumbar back pain or lower extremity pain: Secondary | ICD-10-CM

## 2012-09-03 DIAGNOSIS — IMO0001 Reserved for inherently not codable concepts without codable children: Secondary | ICD-10-CM

## 2012-09-03 DIAGNOSIS — M51369 Other intervertebral disc degeneration, lumbar region without mention of lumbar back pain or lower extremity pain: Secondary | ICD-10-CM

## 2012-09-03 MED ORDER — MAGNESIUM OXIDE 400 MG PO TABS: 800.0000 mg | ORAL_TABLET | Freq: Every day | ORAL | Status: DC

## 2012-09-03 MED ORDER — AMITRIPTYLINE HCL 50 MG PO TABS: ORAL_TABLET | ORAL | Status: DC

## 2012-09-03 NOTE — Assessment & Plan Note (Signed)
Symptoms are classic for lumbar spinal stenosis. Symptoms are mild, we'll start magnesium oxide and amitriptyline at bedtime. Return in 6 weeks.

## 2012-09-03 NOTE — Assessment & Plan Note (Addendum)
Symptoms resolved with conservative measures by the time she went to see Dr. Yevette Edwards. Spinal surgeon has recommended epidural injections should symptoms return. No operative intervention needed at this time.

## 2012-09-03 NOTE — Assessment & Plan Note (Signed)
Resolved with injection.  

## 2012-09-03 NOTE — Progress Notes (Signed)
  Subjective:    CC: Followup  HPI: Left lateral epicondylitis: Resolved after injection.  Left flexor carpi ulnaris tendinitis: Resolved after injection.  Cervical degenerative disc disease: All symptoms resolved with conservative measures including steroids, MRI did show a fairly large disc protrusion, she was able to see Dr. Yevette Edwards who recommended that if symptoms come back, she should pursue epidurals before any type of surgical intervention.  Low back pain: Localized, worse with flexion, discogenic, she does get claudication type symptoms.  Past medical history, Surgical history, Family history not pertinant except as noted below, Social history, Allergies, and medications have been entered into the medical record, reviewed, and no changes needed.   Review of Systems: No fevers, chills, night sweats, weight loss, chest pain, or shortness of breath.   Objective:    General: Well Developed, well nourished, and in no acute distress.  Neuro: Alert and oriented x3, extra-ocular muscles intact, sensation grossly intact.  HEENT: Normocephalic, atraumatic, pupils equal round reactive to light, neck supple, no masses, no lymphadenopathy, thyroid nonpalpable.  Skin: Warm and dry, no rashes. Cardiac: Regular rate and rhythm, no murmurs rubs or gallops, no lower extremity edema.  Respiratory: Clear to auscultation bilaterally. Not using accessory muscles, speaking in full sentences.  Impression and Recommendations:

## 2012-10-07 ENCOUNTER — Other Ambulatory Visit: Payer: Self-pay | Admitting: Family Medicine

## 2012-10-09 ENCOUNTER — Emergency Department (INDEPENDENT_AMBULATORY_CARE_PROVIDER_SITE_OTHER)
Admission: EM | Admit: 2012-10-09 | Discharge: 2012-10-09 | Disposition: A | Discharge status: Home / Self Care | Payer: Medicare HMO | Source: Home / Self Care | Attending: Family Medicine | Admitting: Family Medicine

## 2012-10-09 ENCOUNTER — Encounter: Payer: Self-pay | Admitting: Emergency Medicine

## 2012-10-09 DIAGNOSIS — J069 Acute upper respiratory infection, unspecified: Secondary | ICD-10-CM

## 2012-10-09 DIAGNOSIS — J449 Chronic obstructive pulmonary disease, unspecified: Secondary | ICD-10-CM

## 2012-10-09 HISTORY — DX: Emphysema, unspecified: J43.9

## 2012-10-09 MED ORDER — PREDNISONE 5 MG PO KIT: PACK | ORAL | Status: DC

## 2012-10-09 MED ORDER — DOXYCYCLINE HYCLATE 100 MG PO CAPS: 100.0000 mg | ORAL_CAPSULE | Freq: Two times a day (BID) | ORAL | Status: DC

## 2012-10-09 NOTE — ED Notes (Signed)
2 weeks ago allergy sx started, today got worse with cough, hoarseness, chills, feels like its going down into my chest., Smokes 2&1/2 packs a day

## 2012-10-09 NOTE — ED Provider Notes (Signed)
CSN: [id number]     Arrival date & time 10/09/12  1746 History   First MD Initiated Contact with Patient 10/09/12 1817     Chief Complaint  Patient presents with  . Cough      HPI Comments: Patient reports that she developed mild nasal allergy symptoms about two weeks ago but did not feel ill.  The nasal congestion has persisted, and today she developed scratchy throat, increased sinus congestion, non-productive cough, and anterior chest tightness.  She also had chills today.   She notes that low dose prednisone tapers work well for her.  She states that she had a pneumococcal vaccine injection about a year ago.  The history is provided by the patient.    Past Medical History  Diagnosis Date  . Hypertension   . Hyperlipidemia   . thyroid problem   . Pinched nerve in neck   . Emphysema    Past Surgical History  Procedure Laterality Date  . Cholecystectomy, laparoscopic  07/13/2010   Family History  Problem Relation Age of Onset  . Lymphoma Father   . Diabetes Maternal Grandmother   . Stroke Maternal Grandmother   . Aortic aneurysm Mother     died from  . Stroke Maternal Uncle   . Heart attack      Mothers family  . Aortic aneurysm Mother     Deceased   History  Substance Use Topics  . Smoking status: Current Every Day Smoker -- 2.50 packs/day for 50 years    Types: Cigarettes  . Smokeless tobacco: Not on file  . Alcohol Use: No   OB History   Grav Para Term Preterm Abortions TAB SAB Ect Mult Living                 Review of Systems + sore throat + cough No pleuritic pain + wheezing + nasal congestion + post-nasal drainage No sinus pain/pressure No itchy/red eyes No earache No hemoptysis No SOB No fever, + chills No nausea No vomiting No abdominal pain No diarrhea No urinary symptoms No skin rashes + fatigue No myalgias No headache Used OTC meds without relief  Allergies  Amoxicillin-pot clavulanate; Crestor; Fenofibrate; Lipitor; Other; Pravachol;  Prednisone; Welchol; and Zocor  Home Medications   Current Outpatient Rx  Name  Route  Sig  Dispense  Refill  . EXPIRED: albuterol (PROVENTIL) (2.5 MG/3ML) 0.083% nebulizer solution   Nebulization   Take 3 mLs (2.5 mg total) by nebulization every 4 (four) hours as needed for wheezing.   75 mL   12   . albuterol (VENTOLIN HFA) 108 (90 BASE) MCG/ACT inhaler   Inhalation   Inhale 2 puffs into the lungs every 6 (six) hours as needed for wheezing.   1 Inhaler   2   . ALPRAZolam (XANAX) 0.25 MG tablet   Oral   Take 1 tablet (0.25 mg total) by mouth daily as needed for anxiety.   30 tablet   0   . amitriptyline (ELAVIL) 50 MG tablet      One half tab PO qHS for a week, then one tab PO qHS.   90 tablet   3   . buPROPion (WELLBUTRIN SR) 150 MG 12 hr tablet   Oral   Take 1 tablet (150 mg total) by mouth 2 (two) times daily.   60 tablet   2   . Cholecalciferol (VITAMIN D3) 1000 UNITS CAPS   Oral   Take by mouth.           Marland Kitchen  cyanocobalamin 100 MCG tablet   Oral   Take 100 mcg by mouth daily.           Marland Kitchen doxycycline (VIBRAMYCIN) 100 MG capsule   Oral   Take 1 capsule (100 mg total) by mouth 2 (two) times daily.   20 capsule   0   . fexofenadine (ALLEGRA) 180 MG tablet   Oral   Take 180 mg by mouth daily.           Marland Kitchen HYDROcodone-acetaminophen (NORCO) 10-325 MG per tablet   Oral   Take 1 tablet by mouth every 8 (eight) hours as needed for pain.   40 tablet   0   . HYDROmorphone (DILAUDID) 2 MG tablet      1-2 tabs PO q6h prn pain.   40 tablet   0   . EXPIRED: levocetirizine (XYZAL) 5 MG tablet   Oral   Take 1 tablet (5 mg total) by mouth every evening.   30 tablet   6   . losartan (COZAAR) 100 MG tablet      TAKE 1 TABLET BY MOUTH DAILY   30 tablet   0   . magnesium oxide (MAG-OX) 400 MG tablet   Oral   Take 2 tablets (800 mg total) by mouth at bedtime.   90 tablet   3   . methimazole (TAPAZOLE) 10 MG tablet   Oral   Take 10 mg by mouth 3  (three) times daily.           . metoprolol tartrate (LOPRESSOR) 25 MG tablet      TAKE 1 TABLET BY MOUTH TWICE DAILY   180 tablet   0   . Multiple Vitamins-Minerals (CENTRUM SILVER ULTRA WOMENS PO)   Oral   Take by mouth.           Marland Kitchen omeprazole (PRILOSEC) 40 MG capsule      TAKE ONE CAPSULE BY MOUTH DAILY   90 capsule   0   . oxyCODONE-acetaminophen (PERCOCET) 5-325 MG per tablet   Oral   Take 2 tablets by mouth every 4 (four) hours as needed for pain.   20 tablet   0   . Pitavastatin Calcium (LIVALO) 2 MG TABS   Oral   Take 1 tablet (2 mg total) by mouth at bedtime.   14 tablet   0   . PredniSONE 5 MG KIT      Take as directed   21 kit   0    BP 120/72  Pulse 80  Temp(Src) 98.3 F (36.8 C) (Oral)  Ht 5\' 5"  (1.651 m)  Wt 202 lb (91.627 kg)  BMI 33.61 kg/m2  SpO2 98% Physical Exam Nursing notes and Vital Signs reviewed. Appearance:  Patient appears stated age, and in no acute distress.  Patient is obese (BMI 33.6) Eyes:  Pupils are equal, round, and reactive to light and accomodation.  Extraocular movement is intact.  Conjunctivae are not inflamed  Ears:  Canals normal.  Tympanic membranes normal.  Nose:  Mildly congested turbinates.  No sinus tenderness.    Pharynx:  Normal Neck:  Supple.  Tender shotty posterior nodes are palpated bilaterally  Lungs:   Faint rhonchi posterior bases.  No wheezes or rales.  Breath sounds are equal.  Heart:  Regular rate and rhythm without murmurs, rubs, or gallops.  Abdomen:  Nontender without masses or hepatosplenomegaly.  Bowel sounds are present.  No CVA or flank tenderness.  Extremities:  Trace lower leg  edema.  No calf tenderness Skin:  No rash present.   ED Course  Procedures  none        MDM   1. COPD (chronic obstructive pulmonary disease)   2. Acute upper respiratory infections of unspecified site    Begin doxycycline.  Rx for 5mg  prednisone dose pack; begin if develops increasing congestion next 2 or  3 days.  Patient notes that she does not have an allergy to prednisone, but cannot tolerate high dose bursts. Take plain Robitussin for cough and congestion.  Increase fluid intake, rest. May use Afrin nasal spray (or generic oxymetazoline) twice daily for about 5 days.  Also recommend using saline nasal spray several times daily and saline nasal irrigation (AYR is a common brand) Stop all antihistamines for now, and other non-prescription cough/cold preparations. Continue inhalers as needed. May take Delsym Cough Suppressant at bedtime for nighttime cough.  Follow-up with family doctor if not improving about one week    Lattie Haw, MD 10/09/12 [phone]

## 2012-10-11 ENCOUNTER — Telehealth: Payer: Self-pay | Admitting: *Deleted

## 2012-10-14 ENCOUNTER — Telehealth: Payer: Self-pay | Admitting: *Deleted

## 2012-10-14 DIAGNOSIS — Z1211 Encounter for screening for malignant neoplasm of colon: Secondary | ICD-10-CM

## 2012-10-14 NOTE — Telephone Encounter (Signed)
Referral placed.

## 2012-10-14 NOTE — Telephone Encounter (Signed)
Pt is ok with GI referral for her screening.  She prefers a morning appt.

## 2012-10-15 ENCOUNTER — Ambulatory Visit: Payer: Medicare HMO | Admitting: Sports Medicine

## 2012-10-17 ENCOUNTER — Encounter: Payer: Self-pay | Admitting: Sports Medicine

## 2012-10-17 ENCOUNTER — Emergency Department (INDEPENDENT_AMBULATORY_CARE_PROVIDER_SITE_OTHER)
Admission: EM | Admit: 2012-10-17 | Discharge: 2012-10-17 | Disposition: A | Discharge status: Home / Self Care | Payer: Medicare HMO | Source: Home / Self Care

## 2012-10-17 ENCOUNTER — Ambulatory Visit (INDEPENDENT_AMBULATORY_CARE_PROVIDER_SITE_OTHER): Payer: Medicare HMO | Admitting: Sports Medicine

## 2012-10-17 VITALS — BP 146/71 | HR 86 | Wt 201.0 lb

## 2012-10-17 DIAGNOSIS — T23109A Burn of first degree of unspecified hand, unspecified site, initial encounter: Secondary | ICD-10-CM

## 2012-10-17 DIAGNOSIS — T23101A Burn of first degree of right hand, unspecified site, initial encounter: Secondary | ICD-10-CM

## 2012-10-17 DIAGNOSIS — M51369 Other intervertebral disc degeneration, lumbar region without mention of lumbar back pain or lower extremity pain: Secondary | ICD-10-CM

## 2012-10-17 DIAGNOSIS — M4802 Spinal stenosis, cervical region: Secondary | ICD-10-CM

## 2012-10-17 DIAGNOSIS — M5137 Other intervertebral disc degeneration, lumbosacral region: Secondary | ICD-10-CM

## 2012-10-17 DIAGNOSIS — Z716 Tobacco abuse counseling: Secondary | ICD-10-CM

## 2012-10-17 DIAGNOSIS — M5126 Other intervertebral disc displacement, lumbar region: Secondary | ICD-10-CM

## 2012-10-17 DIAGNOSIS — M51379 Other intervertebral disc degeneration, lumbosacral region without mention of lumbar back pain or lower extremity pain: Secondary | ICD-10-CM

## 2012-10-17 MED ORDER — SILVER SULFADIAZINE 1 % EX CREA: TOPICAL_CREAM | Freq: Every day | CUTANEOUS | Status: DC

## 2012-10-17 MED ORDER — MELOXICAM 15 MG PO TABS: 15.0000 mg | ORAL_TABLET | Freq: Every day | ORAL | Status: DC

## 2012-10-17 MED ORDER — AMITRIPTYLINE HCL 10 MG PO TABS: 10.0000 mg | ORAL_TABLET | Freq: Every evening | ORAL | Status: DC | PRN

## 2012-10-17 MED ORDER — MAGNESIUM OXIDE 400 MG PO TABS: 800.0000 mg | ORAL_TABLET | Freq: Every day | ORAL | Status: DC

## 2012-10-17 NOTE — Progress Notes (Signed)
  Subjective:    CC: followup  HPI: Cervical degenerative disc disease: Stable, overall pain-free with current medications.  Lumbar degenerative disc disease with spinal stenosis and neurogenic claudication: Overall pain is now resolved and spasm has resolved at night with amitriptyline and magnesium, as well as Mobic. The amitriptyline does leave her a little bit groggy in the morning at the 25 mg dose.  Past medical history, Surgical history, Family history not pertinant except as noted below, Social history, Allergies, and medications have been entered into the medical record, reviewed, and no changes needed.   Review of Systems: No fevers, chills, night sweats, weight loss, chest pain, or shortness of breath.   Objective:    General: Well Developed, well nourished, and in no acute distress.  Neuro: Alert and oriented x3, extra-ocular muscles intact, sensation grossly intact.  HEENT: Normocephalic, atraumatic, pupils equal round reactive to light, neck supple, no masses, no lymphadenopathy, thyroid nonpalpable.  Skin: Warm and dry, no rashes. Cardiac: Regular rate and rhythm, no murmurs rubs or gallops, no lower extremity edema.  Respiratory: Clear to auscultation bilaterally. Not using accessory muscles, speaking in full sentences.   Impression and Recommendations:

## 2012-10-17 NOTE — ED Provider Notes (Signed)
CSN: [id number]     Arrival date & time 10/17/12  1919 History   None    Chief Complaint  Patient presents with  . Hand Burn    today    HPI  Right hand burn x1 day. Patient accidentally rest the hand on an allergic murmur. Patient immediately removed her hand. Has had right hand pain on the palmar aspect since this point. No numbness or paresthesias. Patient is able to squeeze and extend hand with full range of motion. Minimal swelling. Has applied ice since onset of burn. Patient currently a 2-1/2 pack per day smoker. Has emphysema. Was seen 34 days ago for an upper respiratory illness. Patient was prescribed doxycycline and prednisone. Patient has only taken the doxycycline.  Past Medical History  Diagnosis Date  . Hypertension   . Hyperlipidemia   . thyroid problem   . Pinched nerve in neck   . Emphysema    Past Surgical History  Procedure Laterality Date  . Cholecystectomy, laparoscopic  07/13/2010   Family History  Problem Relation Age of Onset  . Lymphoma Father   . Diabetes Maternal Grandmother   . Stroke Maternal Grandmother   . Aortic aneurysm Mother     died from  . Stroke Maternal Uncle   . Heart attack      Mothers family  . Aortic aneurysm Mother     Deceased   History  Substance Use Topics  . Smoking status: Current Every Day Smoker -- 2.50 packs/day for 50 years    Types: Cigarettes  . Smokeless tobacco: Not on file  . Alcohol Use: No   OB History   Grav Para Term Preterm Abortions TAB SAB Ect Mult Living                 Review of Systems  All other systems reviewed and are negative.    Allergies  Amoxicillin-pot clavulanate; Crestor; Fenofibrate; Lipitor; Other; Pravachol; Prednisone; Welchol; and Zocor  Home Medications   Current Outpatient Rx  Name  Route  Sig  Dispense  Refill  . albuterol (PROVENTIL) (2.5 MG/3ML) 0.083% nebulizer solution   Nebulization   Take 3 mLs (2.5 mg total) by nebulization every 4 (four) hours as needed  for wheezing.   75 mL   12   . albuterol (VENTOLIN HFA) 108 (90 BASE) MCG/ACT inhaler   Inhalation   Inhale 2 puffs into the lungs every 6 (six) hours as needed for wheezing.   1 Inhaler   2   . ALPRAZolam (XANAX) 0.25 MG tablet   Oral   Take 1 tablet (0.25 mg total) by mouth daily as needed for anxiety.   30 tablet   0   . amitriptyline (ELAVIL) 10 MG tablet   Oral   Take 1 tablet (10 mg total) by mouth at bedtime as needed for sleep.   90 tablet   3   . buPROPion (WELLBUTRIN SR) 150 MG 12 hr tablet   Oral   Take 1 tablet (150 mg total) by mouth 2 (two) times daily.   60 tablet   2   . Cholecalciferol (VITAMIN D3) 1000 UNITS CAPS   Oral   Take by mouth.           . cyanocobalamin 100 MCG tablet   Oral   Take 100 mcg by mouth daily.           Marland Kitchen doxycycline (VIBRAMYCIN) 100 MG capsule   Oral   Take 1 capsule (100 mg  total) by mouth 2 (two) times daily.   20 capsule   0   . fexofenadine (ALLEGRA) 180 MG tablet   Oral   Take 180 mg by mouth daily.           Marland Kitchen HYDROcodone-acetaminophen (NORCO) 10-325 MG per tablet   Oral   Take 1 tablet by mouth every 8 (eight) hours as needed for pain.   40 tablet   0   . levocetirizine (XYZAL) 5 MG tablet   Oral   Take 1 tablet (5 mg total) by mouth every evening.   30 tablet   6   . losartan (COZAAR) 100 MG tablet      TAKE 1 TABLET BY MOUTH DAILY   30 tablet   0   . magnesium oxide (MAG-OX) 400 MG tablet   Oral   Take 2 tablets (800 mg total) by mouth at bedtime.   180 tablet   3   . meloxicam (MOBIC) 15 MG tablet   Oral   Take 1 tablet (15 mg total) by mouth daily.   90 tablet   3   . methimazole (TAPAZOLE) 10 MG tablet   Oral   Take 10 mg by mouth 3 (three) times daily.           . metoprolol tartrate (LOPRESSOR) 25 MG tablet      TAKE 1 TABLET BY MOUTH TWICE DAILY   180 tablet   0   . Multiple Vitamins-Minerals (CENTRUM SILVER ULTRA WOMENS PO)   Oral   Take by mouth.           Marland Kitchen  omeprazole (PRILOSEC) 40 MG capsule      TAKE ONE CAPSULE BY MOUTH DAILY   90 capsule   0   . oxyCODONE-acetaminophen (PERCOCET) 5-325 MG per tablet   Oral   Take 2 tablets by mouth every 4 (four) hours as needed for pain.   20 tablet   0   . Pitavastatin Calcium (LIVALO) 2 MG TABS   Oral   Take 1 tablet (2 mg total) by mouth at bedtime.   14 tablet   0   . PredniSONE 5 MG KIT      Take as directed   21 kit   0    BP 145/77  Pulse 75  Temp(Src) 98.1 F (36.7 C) (Oral)  Ht 5\' 5"  (1.651 m)  Wt 203 lb (92.08 kg)  BMI 33.78 kg/m2  SpO2 100% Physical Exam  Constitutional: She appears well-developed and well-nourished.  HENT:  Head: Normocephalic and atraumatic.  Eyes: Conjunctivae are normal. Pupils are equal, round, and reactive to light.  Neck: Normal range of motion.  Cardiovascular: Normal rate and regular rhythm.   Pulmonary/Chest: Effort normal. She has wheezes.  Abdominal: Soft.  Neurological: She is alert.  Skin: There is erythema.   Mild blister formation primarily on the palmar aspect of the base of the second and third fingers. Right hand with full range of motion. Neurovascularly intact.  ED Course  Procedures (including critical care time) Labs Review Labs Reviewed - No data to display Imaging Review No results found.  MDM   1. Burn, hand, first degree, right, initial encounter    Fairly superficial first second-degree burn on exam. Area cleansed. Topical Silvadene placed with secondary wrapping. Discuss dermatologic and infectious red flags at length with patient. Discussed smoking cessation at length. Start COPD exacerbation regimen as recommended from the last visit. Followup as needed.     The  patient and/or caregiver has been counseled thoroughly with regard to treatment plan and/or medications prescribed including dosage, schedule, interactions, rationale for use, and possible side effects and they verbalize understanding. Diagnoses  and expected course of recovery discussed and will return if not improved as expected or if the condition worsens. Patient and/or caregiver verbalized understanding.         Doree Albee, MD 10/17/12 2026

## 2012-10-17 NOTE — Assessment & Plan Note (Signed)
Currently asymptomatic, we can pursue injections next, she has already seen spine surgeon who agrees that epidurals should be proceeded with first. She would like a refill on Mobic and magnesium.

## 2012-10-17 NOTE — ED Notes (Signed)
Laura Ellison burned her right hand today.

## 2012-10-17 NOTE — Assessment & Plan Note (Signed)
Symptoms are now essentially resolved with occasional amitriptyline at bedtime, and magnesium at that time. No adverse effects. She does get slightly drowsy in the morning, I am going to decrease her amitriptyline from 25-10 mg. Continue all of the above.

## 2012-10-24 ENCOUNTER — Other Ambulatory Visit: Payer: Self-pay | Admitting: Family Medicine

## 2012-11-05 ENCOUNTER — Other Ambulatory Visit: Payer: Self-pay | Admitting: Family Medicine

## 2012-11-19 ENCOUNTER — Encounter: Payer: Self-pay | Admitting: Family Medicine

## 2012-11-19 ENCOUNTER — Ambulatory Visit (INDEPENDENT_AMBULATORY_CARE_PROVIDER_SITE_OTHER): Payer: Medicare HMO | Admitting: Family Medicine

## 2012-11-19 DIAGNOSIS — Z23 Encounter for immunization: Secondary | ICD-10-CM

## 2012-11-19 NOTE — Progress Notes (Signed)
I was present for all necessary aspects of today's visit 

## 2012-12-03 ENCOUNTER — Other Ambulatory Visit: Payer: Self-pay | Admitting: Family Medicine

## 2012-12-15 ENCOUNTER — Other Ambulatory Visit: Payer: Self-pay | Admitting: Orthopedic Surgery

## 2012-12-15 ENCOUNTER — Encounter (HOSPITAL_COMMUNITY): Payer: Self-pay | Admitting: Pharmacy Technician

## 2012-12-23 ENCOUNTER — Encounter (HOSPITAL_COMMUNITY)
Admission: RE | Admit: 2012-12-23 | Discharge: 2012-12-23 | Disposition: A | Discharge status: Home / Self Care | Payer: Medicare HMO | Source: Ambulatory Visit | Attending: Orthopedic Surgery | Admitting: Orthopedic Surgery

## 2012-12-23 ENCOUNTER — Encounter (HOSPITAL_COMMUNITY): Payer: Self-pay

## 2012-12-23 ENCOUNTER — Encounter (HOSPITAL_COMMUNITY): Payer: Self-pay | Admitting: Pharmacy Technician

## 2012-12-23 HISTORY — DX: Other seasonal allergic rhinitis: J30.2

## 2012-12-23 HISTORY — DX: Family history of other specified conditions: Z84.89

## 2012-12-23 HISTORY — DX: Irritable bowel syndrome, unspecified: K58.9

## 2012-12-23 HISTORY — DX: Pneumonia, unspecified organism: J18.9

## 2012-12-23 HISTORY — DX: Unspecified osteoarthritis, unspecified site: M19.90

## 2012-12-23 HISTORY — DX: Cardiac murmur, unspecified: R01.1

## 2012-12-23 HISTORY — DX: Anemia, unspecified: D64.9

## 2012-12-23 HISTORY — DX: Headache: R51

## 2012-12-23 HISTORY — DX: Gastro-esophageal reflux disease without esophagitis: K21.9

## 2012-12-23 HISTORY — DX: Cardiac arrhythmia, unspecified: I49.9

## 2012-12-23 HISTORY — DX: Anxiety disorder, unspecified: F41.9

## 2012-12-23 LAB — URINE MICROSCOPIC-ADD ON

## 2012-12-23 LAB — URINALYSIS, ROUTINE W REFLEX MICROSCOPIC
Bilirubin Urine: NEGATIVE
Nitrite: NEGATIVE
Specific Gravity, Urine: 1.004 — ABNORMAL LOW (ref 1.005–1.030)
pH: 6 (ref 5.0–8.0)

## 2012-12-23 LAB — CBC WITH DIFFERENTIAL/PLATELET
Hemoglobin: 12.4 g/dL (ref 12.0–15.0)
Lymphocytes Relative: 33 % (ref 12–46)
Lymphs Abs: 4.1 10*3/uL — ABNORMAL HIGH (ref 0.7–4.0)
MCH: 28.9 pg (ref 26.0–34.0)
Monocytes Relative: 6 % (ref 3–12)
Neutro Abs: 7.3 10*3/uL (ref 1.7–7.7)
Neutrophils Relative %: 59 % (ref 43–77)
RBC: 4.29 MIL/uL (ref 3.87–5.11)
WBC: 12.4 10*3/uL — ABNORMAL HIGH (ref 4.0–10.5)

## 2012-12-23 LAB — COMPREHENSIVE METABOLIC PANEL
ALT: 11 U/L (ref 0–35)
Alkaline Phosphatase: 95 U/L (ref 39–117)
BUN: 12 mg/dL (ref 6–23)
CO2: 21 mEq/L (ref 19–32)
Chloride: 104 mEq/L (ref 96–112)
GFR calc Af Amer: >90 mL/min (ref >90)
Glucose, Bld: 92 mg/dL (ref 70–99)
Potassium: 4.3 mEq/L (ref 3.5–5.1)
Sodium: 138 mEq/L (ref 135–145)
Total Bilirubin: 0.2 mg/dL — ABNORMAL LOW (ref 0.3–1.2)

## 2012-12-23 LAB — APTT: aPTT: 33 seconds (ref 24–37)

## 2012-12-23 LAB — SURGICAL PCR SCREEN: Staphylococcus aureus: NEGATIVE

## 2012-12-23 LAB — PROTIME-INR: Prothrombin Time: 12.3 seconds (ref 11.6–15.2)

## 2012-12-23 NOTE — Progress Notes (Signed)
Pt denies being under the care of a cardiologist. Pt stated that she had a stress test echo, and cardiac cath in [place] 6-7 years ago; pt cannot remember the name of the cardiologist or the practice that completed the studies. Pt stated that she had an ablation and has had no problems since.

## 2012-12-23 NOTE — Progress Notes (Signed)
Spoke with Revonda Standard, PA ( anesthesia) regarding pt noon dose of metoprolol on DOS. Revonda Standard, PA ( anesthesia) made aware that pt takes Metoprolol at noon and bedtime daily. Revonda Standard, PA advised that pt should take Metoprolol in early AM on day of surgery (7:30-12:35  surgery time) to avoid missing noon dose.

## 2012-12-23 NOTE — Pre-Procedure Instructions (Signed)
Laura Ellison  12/23/2012   Your procedure is scheduled on:  Thursday, December 25, 2012  Report to [facility] Short Stay (use Main Entrance "A'') at 5:30 AM.  Call this number if you have problems the morning of surgery: [phone]   Remember:   Do not eat food or drink liquids after midnight.   Take these medicines the morning of surgery with A SIP OF WATER:metoprolol tartrate (LOPRESSOR) 25 MG tablet, ,omeprazole (PRILOSEC) 40 MG capsule, methimazole (TAPAZOLE) 10 MG tablet ,fexofenad ine (ALLEGRA) 180 MG tablet  If needed:ALPRAZolam (XANAX) 0.25 MG tablet for anxiety  Acetaminophen (TYLENOL ARTHRITIS PAIN PO),  albuterol (PROVENTIL) (2.5 MG/3ML) 0.083% nebulizer solution   for wheezing, albuterol (VENTOLIN HFA) 108 (90 BASE) MCG/ACT inhaler for wheezing ( Bring in on day of surgery). Stop taking Aspirin, vitamins, and herbal medications. Do not take any NSAIDs ie: Ibuprofen, Advil, Naproxen or any medication containing Aspirin.  Do not wear jewelry, make-up or nail polish.  Do not wear lotions, powders, or perfumes. You may wear deodorant.  Do not shave 48 hours prior to surgery.   Do not bring valuables to the hospital.  [facility] is not responsible for any belongings or valuables.               Contacts, dentures or bridgework may not be worn into surgery.  Leave suitcase in the car. After surgery it may be brought to your room.  For patients admitted to the hospital, discharge time is determined by your treatment team.               Patients discharged the day of surgery will not be allowed to drive home.  Name and phone number of your driver:   Special Instructions: Shower using CHG 2 nights before surgery and the night before surgery.  If you shower the day of surgery use CHG.  Use special wash - you have one bottle of CHG for all showers.  You should use approximately 1/3 of the bottle for each shower.   Please read over the following fact sheets that you were given: Pain  Booklet, Coughing and Deep Breathing, Blood Transfusion Information, MRSA Information and Surgical Site Infection Prevention

## 2012-12-24 MED ORDER — VANCOMYCIN HCL IN DEXTROSE 1-5 GM/200ML-% IV SOLN
1000.0000 mg | INTRAVENOUS | Status: AC
  Administered 2012-12-25: 1000 mg via INTRAVENOUS
  Filled 2012-12-24: qty 200

## 2012-12-24 NOTE — H&P (Signed)
PREOPERATIVE H&P  Chief Complaint: left arm pain  HPI: Laura Ellison is a 67 y.o. female who presents with ongoing pain in the left arm. MRI reveals spinal cord compression and left sided NF compression C4-T1. Explained risks and benefits of surgery and patient did wish to proceed.  Past Medical History  Diagnosis Date  . Hypertension   . Hyperlipidemia   . thyroid problem   . Pinched nerve in neck   . Emphysema   . Family history of anesthesia complication     "son had a problem waking up"  . Dysrhythmia     Hx; of palpitations in the past  . Anxiety     Hx; of situational anxiety  . Seasonal allergies     Hx: of  . Pneumonia     Hx:of a "touch' of PNA  . Heart murmur     Hx: of as a child  . GERD (gastroesophageal reflux disease)   . Headache(784.0)   . Arthritis   . Anemia     Hx: of as a teen  . IBS (irritable bowel syndrome)     Hx; of   Past Surgical History  Procedure Laterality Date  . Cholecystectomy, laparoscopic  07/13/2010  . Ablation      Hx: of  . Cholecystectomy    . Appendectomy    . Tonsillectomy    . Cyst excision      Hx: of several  . Cardiac catheterization    . Eye surgery      Hx: of cyst removed from left eye  . Fine needle aspiration      Hx: of right breast   History   Social History  . Marital Status: Married    Spouse Name: Casimiro Needle     Number of Children: 2  . Years of Education: HS   Occupational History  . Retired.     Social History Main Topics  . Smoking status: Current Every Day Smoker -- 2.50 packs/day for 50 years    Types: Cigarettes  . Smokeless tobacco: Never Used  . Alcohol Use: Yes     Comment: " occasional"  . Drug Use: No  . Sexual Activity: Not Currently   Other Topics Concern  . Not on file   Social History Narrative   10 caffeinated drinks daily. No regular exercise. She recently moved here from [place] she now lives with her husband, daughter, granddaughter, son and sometimes grandson.    Family History  Problem Relation Age of Onset  . Lymphoma Father   . Diabetes Maternal Grandmother   . Stroke Maternal Grandmother   . Aortic aneurysm Mother     died from  . Stroke Maternal Uncle   . Heart attack      Mothers family  . Aortic aneurysm Mother     Deceased   Allergies  Allergen Reactions  . Amoxicillin-Pot Clavulanate Diarrhea    Severe abdominal cramps and diarrhea  . Crestor [Rosuvastatin Calcium] Other (See Comments)    GI symptoms.    . Fenofibrate Other (See Comments)    GI sxs.   . Lipitor [Atorvastatin Calcium] Other (See Comments)    GI upset/stomach cramping  . Other     Pt states that strong ABT's cause bloody diarrhea  . Pravachol   . Prednisone Other (See Comments)    Chest discomfort with burst, has to do taper.  Cena Benton Hcl] Other (See Comments)    Cramping   .  Zocor [Simvastatin - High Dose]    Prior to Admission medications   Medication Sig Start Date End Date Taking? Authorizing Provider  Acetaminophen (TYLENOL ARTHRITIS PAIN PO) Take 2 tablets by mouth daily as needed.   Yes Historical Provider, MD  ALPRAZolam Prudy Feeler) 0.25 MG tablet Take 0.25 mg by mouth at bedtime as needed for anxiety.   Yes Historical Provider, MD  B Complex-C (SUPER B COMPLEX PO) Take 1 tablet by mouth every other day.   Yes Historical Provider, MD  Cholecalciferol (VITAMIN D3) 1000 UNITS CAPS Take 2 capsules by mouth daily.    Yes Historical Provider, MD  fexofenadine (ALLEGRA) 180 MG tablet Take 180 mg by mouth every morning.    Yes Historical Provider, MD  losartan (COZAAR) 50 MG tablet Take 50 mg by mouth daily. Take 50 mg at noon.   Yes Historical Provider, MD  methimazole (TAPAZOLE) 10 MG tablet Take 10 mg by mouth every morning.    Yes Historical Provider, MD  metoprolol tartrate (LOPRESSOR) 25 MG tablet Take 25 mg by mouth 2 (two) times daily. 1 at noon, 1 at bedtime.   Yes Historical Provider, MD  Multiple Vitamins-Minerals (CENTRUM SILVER  ULTRA WOMENS PO) Take 1 tablet by mouth daily.    Yes Historical Provider, MD  omeprazole (PRILOSEC) 40 MG capsule Take 40 mg by mouth every morning.    Yes Historical Provider, MD  vitamin B-12 (CYANOCOBALAMIN) 1000 MCG tablet Take 1,000 mcg by mouth every other day.   Yes Historical Provider, MD  albuterol (PROVENTIL) (2.5 MG/3ML) 0.083% nebulizer solution Take 3 mLs (2.5 mg total) by nebulization every 4 (four) hours as needed for wheezing. 10/04/11 10/17/12  Agapito Games, MD  albuterol (VENTOLIN HFA) 108 (90 BASE) MCG/ACT inhaler Inhale 2 puffs into the lungs every 6 (six) hours as needed for wheezing. 04/15/12 04/15/13  Agapito Games, MD  levocetirizine (XYZAL) 5 MG tablet Take 5 mg by mouth daily. Take 50 mg at noon. 05/11/11 10/17/12  Agapito Games, MD     All other systems have been reviewed and were otherwise negative with the exception of those mentioned in the HPI and as above.  Physical Exam: There were no vitals filed for this visit.  General: Alert, no acute distress Cardiovascular: No pedal edema Respiratory: No cyanosis, no use of accessory musculature Skin: No lesions in the area of chief complaint Neurologic: Sensation intact distally Psychiatric: Patient is competent for consent with normal mood and affect Lymphatic: No axillary or cervical lymphadenopathy  MUSCULOSKELETAL: + spurling's on left  Assessment/Plan: Left arm pain, myelopathy Plan for Procedure(s): ANTERIOR CERVICAL DECOMPRESSION/DISCECTOMY FUSION 4 LEVELS   Emilee Hero, MD 12/24/2012 12:44 PM

## 2012-12-25 ENCOUNTER — Encounter (HOSPITAL_COMMUNITY): Payer: Self-pay | Admitting: *Deleted

## 2012-12-25 ENCOUNTER — Inpatient Hospital Stay (HOSPITAL_COMMUNITY)
Admission: RE | Admit: 2012-12-25 | Discharge: 2012-12-26 | DRG: 473 | Disposition: A | Discharge status: Home / Self Care | Payer: Medicare HMO | Source: Ambulatory Visit | Attending: Orthopedic Surgery | Admitting: Orthopedic Surgery

## 2012-12-25 ENCOUNTER — Inpatient Hospital Stay (HOSPITAL_COMMUNITY): Payer: Medicare HMO

## 2012-12-25 ENCOUNTER — Encounter (HOSPITAL_COMMUNITY)
Admission: RE | Disposition: A | Discharge status: Home / Self Care | Payer: Self-pay | Source: Ambulatory Visit | Attending: Orthopedic Surgery

## 2012-12-25 ENCOUNTER — Inpatient Hospital Stay (HOSPITAL_COMMUNITY): Payer: Medicare HMO | Admitting: Anesthesiology

## 2012-12-25 ENCOUNTER — Encounter (HOSPITAL_COMMUNITY): Payer: Medicare HMO | Admitting: Anesthesiology

## 2012-12-25 DIAGNOSIS — K589 Irritable bowel syndrome without diarrhea: Secondary | ICD-10-CM | POA: Diagnosis present

## 2012-12-25 DIAGNOSIS — Z823 Family history of stroke: Secondary | ICD-10-CM

## 2012-12-25 DIAGNOSIS — Z807 Family history of other malignant neoplasms of lymphoid, hematopoietic and related tissues: Secondary | ICD-10-CM

## 2012-12-25 DIAGNOSIS — Z881 Allergy status to other antibiotic agents status: Secondary | ICD-10-CM

## 2012-12-25 DIAGNOSIS — I10 Essential (primary) hypertension: Secondary | ICD-10-CM | POA: Diagnosis present

## 2012-12-25 DIAGNOSIS — E785 Hyperlipidemia, unspecified: Secondary | ICD-10-CM | POA: Diagnosis present

## 2012-12-25 DIAGNOSIS — F17200 Nicotine dependence, unspecified, uncomplicated: Secondary | ICD-10-CM | POA: Diagnosis present

## 2012-12-25 DIAGNOSIS — Z833 Family history of diabetes mellitus: Secondary | ICD-10-CM

## 2012-12-25 DIAGNOSIS — F411 Generalized anxiety disorder: Secondary | ICD-10-CM | POA: Diagnosis present

## 2012-12-25 DIAGNOSIS — Z8249 Family history of ischemic heart disease and other diseases of the circulatory system: Secondary | ICD-10-CM

## 2012-12-25 DIAGNOSIS — Z888 Allergy status to other drugs, medicaments and biological substances status: Secondary | ICD-10-CM

## 2012-12-25 DIAGNOSIS — J438 Other emphysema: Secondary | ICD-10-CM | POA: Diagnosis present

## 2012-12-25 DIAGNOSIS — Z9089 Acquired absence of other organs: Secondary | ICD-10-CM

## 2012-12-25 DIAGNOSIS — M4712 Other spondylosis with myelopathy, cervical region: Principal | ICD-10-CM | POA: Diagnosis present

## 2012-12-25 DIAGNOSIS — M810 Age-related osteoporosis without current pathological fracture: Secondary | ICD-10-CM | POA: Diagnosis present

## 2012-12-25 DIAGNOSIS — K219 Gastro-esophageal reflux disease without esophagitis: Secondary | ICD-10-CM | POA: Diagnosis present

## 2012-12-25 DIAGNOSIS — Z01812 Encounter for preprocedural laboratory examination: Secondary | ICD-10-CM

## 2012-12-25 HISTORY — PX: ANTERIOR CERVICAL DECOMPRESSION/DISCECTOMY FUSION 4 LEVELS: SHX5556

## 2012-12-25 SURGERY — ANTERIOR CERVICAL DECOMPRESSION/DISCECTOMY FUSION 4 LEVELS
Anesthesia: General | Site: Neck | Wound class: Clean

## 2012-12-25 MED ORDER — ALUM & MAG HYDROXIDE-SIMETH 200-200-20 MG/5ML PO SUSP: 30.0000 mL | Freq: Four times a day (QID) | ORAL | Status: DC | PRN

## 2012-12-25 MED ORDER — LEVOCETIRIZINE DIHYDROCHLORIDE 5 MG PO TABS: 5.0000 mg | ORAL_TABLET | Freq: Every day | ORAL | Status: DC

## 2012-12-25 MED ORDER — MINERAL OIL LIGHT 100 % EX OIL
TOPICAL_OIL | CUTANEOUS | Status: AC
  Filled 2012-12-25: qty 25

## 2012-12-25 MED ORDER — FENTANYL CITRATE 0.05 MG/ML IJ SOLN
INTRAMUSCULAR | Status: DC | PRN
  Administered 2012-12-25 (×3): 50 ug via INTRAVENOUS
  Administered 2012-12-25: 100 ug via INTRAVENOUS

## 2012-12-25 MED ORDER — PANTOPRAZOLE SODIUM 40 MG PO TBEC
80.0000 mg | DELAYED_RELEASE_TABLET | Freq: Every day | ORAL | Status: DC
  Administered 2012-12-26: 80 mg via ORAL
  Filled 2012-12-25: qty 2

## 2012-12-25 MED ORDER — OXYCODONE HCL 5 MG/5ML PO SOLN: 5.0000 mg | Freq: Once | ORAL | Status: DC | PRN

## 2012-12-25 MED ORDER — SENNOSIDES-DOCUSATE SODIUM 8.6-50 MG PO TABS: 1.0000 | ORAL_TABLET | Freq: Every evening | ORAL | Status: DC | PRN

## 2012-12-25 MED ORDER — DOCUSATE SODIUM 100 MG PO CAPS
100.0000 mg | ORAL_CAPSULE | Freq: Two times a day (BID) | ORAL | Status: DC
  Administered 2012-12-25 – 2012-12-26 (×2): 100 mg via ORAL
  Filled 2012-12-25 (×2): qty 1

## 2012-12-25 MED ORDER — MORPHINE SULFATE 2 MG/ML IJ SOLN: 1.0000 mg | INTRAMUSCULAR | Status: DC | PRN

## 2012-12-25 MED ORDER — PROMETHAZINE HCL 25 MG/ML IJ SOLN: 6.2500 mg | INTRAMUSCULAR | Status: DC | PRN

## 2012-12-25 MED ORDER — ACETAMINOPHEN 325 MG PO TABS: 650.0000 mg | ORAL_TABLET | ORAL | Status: DC | PRN

## 2012-12-25 MED ORDER — METOPROLOL TARTRATE 25 MG PO TABS
25.0000 mg | ORAL_TABLET | Freq: Two times a day (BID) | ORAL | Status: DC
  Administered 2012-12-25 – 2012-12-26 (×2): 25 mg via ORAL
  Filled 2012-12-25 (×3): qty 1

## 2012-12-25 MED ORDER — ALBUTEROL SULFATE (5 MG/ML) 0.5% IN NEBU
2.5000 mg | INHALATION_SOLUTION | RESPIRATORY_TRACT | Status: DC | PRN
  Administered 2012-12-25: 2.5 mg via RESPIRATORY_TRACT
  Filled 2012-12-25 (×2): qty 0.5

## 2012-12-25 MED ORDER — LORATADINE 10 MG PO TABS
10.0000 mg | ORAL_TABLET | Freq: Every day | ORAL | Status: DC
  Administered 2012-12-26: 10 mg via ORAL
  Filled 2012-12-25: qty 1

## 2012-12-25 MED ORDER — ACETAMINOPHEN 650 MG RE SUPP: 650.0000 mg | RECTAL | Status: DC | PRN

## 2012-12-25 MED ORDER — OXYCODONE-ACETAMINOPHEN 5-325 MG PO TABS
1.0000 | ORAL_TABLET | ORAL | Status: DC | PRN
  Administered 2012-12-26 (×2): 2 via ORAL
  Filled 2012-12-25 (×2): qty 2

## 2012-12-25 MED ORDER — METHIMAZOLE 10 MG PO TABS
10.0000 mg | ORAL_TABLET | Freq: Every morning | ORAL | Status: DC
  Administered 2012-12-26: 10 mg via ORAL
  Filled 2012-12-25: qty 1

## 2012-12-25 MED ORDER — PROPOFOL 10 MG/ML IV BOLUS
INTRAVENOUS | Status: DC | PRN
  Administered 2012-12-25: 200 mg via INTRAVENOUS

## 2012-12-25 MED ORDER — BISACODYL 5 MG PO TBEC: 5.0000 mg | DELAYED_RELEASE_TABLET | Freq: Every day | ORAL | Status: DC | PRN

## 2012-12-25 MED ORDER — ALBUTEROL SULFATE HFA 108 (90 BASE) MCG/ACT IN AERS
2.0000 | INHALATION_SPRAY | Freq: Four times a day (QID) | RESPIRATORY_TRACT | Status: DC | PRN
  Filled 2012-12-25: qty 6.7

## 2012-12-25 MED ORDER — GELATIN ABSORBABLE MT POWD
OROMUCOSAL | Status: DC | PRN
  Administered 2012-12-25: 08:00:00 via TOPICAL

## 2012-12-25 MED ORDER — SODIUM CHLORIDE 0.9 % IV SOLN
INTRAVENOUS | Status: DC | PRN
  Administered 2012-12-25: 07:00:00 via INTRAVENOUS

## 2012-12-25 MED ORDER — SODIUM CHLORIDE 0.9 % IJ SOLN
3.0000 mL | Freq: Two times a day (BID) | INTRAMUSCULAR | Status: DC
  Administered 2012-12-25: 3 mL via INTRAVENOUS

## 2012-12-25 MED ORDER — LIDOCAINE HCL (CARDIAC) 20 MG/ML IV SOLN
INTRAVENOUS | Status: DC | PRN
  Administered 2012-12-25: 100 mg via INTRAVENOUS

## 2012-12-25 MED ORDER — FLEET ENEMA 7-19 GM/118ML RE ENEM: 1.0000 | ENEMA | Freq: Once | RECTAL | Status: AC | PRN

## 2012-12-25 MED ORDER — THROMBIN 20000 UNITS EX SOLR
CUTANEOUS | Status: AC
  Filled 2012-12-25: qty 20000

## 2012-12-25 MED ORDER — LACTATED RINGERS IV SOLN
INTRAVENOUS | Status: DC | PRN
  Administered 2012-12-25 (×2): via INTRAVENOUS

## 2012-12-25 MED ORDER — LOSARTAN POTASSIUM 50 MG PO TABS
50.0000 mg | ORAL_TABLET | Freq: Every day | ORAL | Status: DC
  Administered 2012-12-25 – 2012-12-26 (×2): 50 mg via ORAL
  Filled 2012-12-25 (×2): qty 1

## 2012-12-25 MED ORDER — POVIDONE-IODINE 7.5 % EX SOLN: Freq: Once | CUTANEOUS | Status: DC

## 2012-12-25 MED ORDER — VANCOMYCIN HCL 10 G IV SOLR
1250.0000 mg | Freq: Once | INTRAVENOUS | Status: AC
  Administered 2012-12-25: 1250 mg via INTRAVENOUS
  Filled 2012-12-25: qty 1250

## 2012-12-25 MED ORDER — ONDANSETRON HCL 4 MG/2ML IJ SOLN
4.0000 mg | INTRAMUSCULAR | Status: DC | PRN
  Administered 2012-12-25 – 2012-12-26 (×2): 4 mg via INTRAVENOUS
  Filled 2012-12-25 (×2): qty 2

## 2012-12-25 MED ORDER — MENTHOL 3 MG MT LOZG: 1.0000 | LOZENGE | OROMUCOSAL | Status: DC | PRN

## 2012-12-25 MED ORDER — PHENOL 1.4 % MT LIQD: 1.0000 | OROMUCOSAL | Status: DC | PRN

## 2012-12-25 MED ORDER — ARTIFICIAL TEARS OP OINT
TOPICAL_OINTMENT | OPHTHALMIC | Status: DC | PRN
  Administered 2012-12-25: 1 via OPHTHALMIC

## 2012-12-25 MED ORDER — MIDAZOLAM HCL 5 MG/5ML IJ SOLN
INTRAMUSCULAR | Status: DC | PRN
  Administered 2012-12-25: 2 mg via INTRAVENOUS

## 2012-12-25 MED ORDER — GLYCOPYRROLATE 0.2 MG/ML IJ SOLN
INTRAMUSCULAR | Status: DC | PRN
  Administered 2012-12-25: 0.4 mg via INTRAVENOUS

## 2012-12-25 MED ORDER — BUPIVACAINE-EPINEPHRINE 0.25% -1:200000 IJ SOLN
INTRAMUSCULAR | Status: DC | PRN
  Administered 2012-12-25: 4 mL

## 2012-12-25 MED ORDER — ALPRAZOLAM 0.25 MG PO TABS: 0.2500 mg | ORAL_TABLET | Freq: Every evening | ORAL | Status: DC | PRN

## 2012-12-25 MED ORDER — HYDROMORPHONE HCL PF 1 MG/ML IJ SOLN
0.2500 mg | INTRAMUSCULAR | Status: DC | PRN
  Administered 2012-12-25 (×2): 0.25 mg via INTRAVENOUS

## 2012-12-25 MED ORDER — SODIUM CHLORIDE 0.9 % IV SOLN
250.0000 mL | INTRAVENOUS | Status: DC
  Administered 2012-12-25 (×2): 250 mL via INTRAVENOUS

## 2012-12-25 MED ORDER — PHENYLEPHRINE HCL 10 MG/ML IJ SOLN
INTRAMUSCULAR | Status: DC | PRN
  Administered 2012-12-25: 80 ug via INTRAVENOUS
  Administered 2012-12-25 (×5): 40 ug via INTRAVENOUS

## 2012-12-25 MED ORDER — OXYCODONE HCL 5 MG PO TABS: 5.0000 mg | ORAL_TABLET | Freq: Once | ORAL | Status: DC | PRN

## 2012-12-25 MED ORDER — ALBUTEROL SULFATE HFA 108 (90 BASE) MCG/ACT IN AERS
INHALATION_SPRAY | RESPIRATORY_TRACT | Status: DC | PRN
  Administered 2012-12-25: 2 via RESPIRATORY_TRACT

## 2012-12-25 MED ORDER — ONDANSETRON HCL 4 MG/2ML IJ SOLN
INTRAMUSCULAR | Status: DC | PRN
  Administered 2012-12-25: 4 mg via INTRAVENOUS

## 2012-12-25 MED ORDER — NEOSTIGMINE METHYLSULFATE 1 MG/ML IJ SOLN
INTRAMUSCULAR | Status: DC | PRN
  Administered 2012-12-25: 3 mg via INTRAVENOUS

## 2012-12-25 MED ORDER — HYDROMORPHONE HCL PF 1 MG/ML IJ SOLN
INTRAMUSCULAR | Status: AC
  Filled 2012-12-25: qty 1

## 2012-12-25 MED ORDER — VECURONIUM BROMIDE 10 MG IV SOLR
INTRAVENOUS | Status: DC | PRN
  Administered 2012-12-25 (×2): 1 mg via INTRAVENOUS
  Administered 2012-12-25: 2 mg via INTRAVENOUS
  Administered 2012-12-25: 1 mg via INTRAVENOUS
  Administered 2012-12-25 (×2): 2 mg via INTRAVENOUS
  Administered 2012-12-25: 1 mg via INTRAVENOUS

## 2012-12-25 MED ORDER — SODIUM CHLORIDE 0.9 % IJ SOLN: 3.0000 mL | INTRAMUSCULAR | Status: DC | PRN

## 2012-12-25 MED ORDER — ROCURONIUM BROMIDE 100 MG/10ML IV SOLN
INTRAVENOUS | Status: DC | PRN
  Administered 2012-12-25: 50 mg via INTRAVENOUS

## 2012-12-25 MED ORDER — VITAMIN B-12 1000 MCG PO TABS
1000.0000 ug | ORAL_TABLET | ORAL | Status: DC
  Administered 2012-12-25: 1000 ug via ORAL
  Filled 2012-12-25: qty 1

## 2012-12-25 MED ORDER — ZOLPIDEM TARTRATE 5 MG PO TABS: 5.0000 mg | ORAL_TABLET | Freq: Every evening | ORAL | Status: DC | PRN

## 2012-12-25 MED ORDER — DIAZEPAM 5 MG PO TABS
5.0000 mg | ORAL_TABLET | Freq: Four times a day (QID) | ORAL | Status: DC | PRN
  Administered 2012-12-25: 5 mg via ORAL
  Filled 2012-12-25: qty 1

## 2012-12-25 MED ORDER — PHENYLEPHRINE HCL 10 MG/ML IJ SOLN
10.0000 mg | INTRAVENOUS | Status: DC | PRN
  Administered 2012-12-25: 20 ug/min via INTRAVENOUS

## 2012-12-25 SURGICAL SUPPLY — 75 items
BENZOIN TINCTURE PRP APPL 2/3 (GAUZE/BANDAGES/DRESSINGS) ×2 IMPLANT
BIT DRILL NEURO 2X3.1 SFT TUCH (MISCELLANEOUS) ×1 IMPLANT
BIT DRILL SKYLINE 14 (BIT) ×1
BIT DRILL SKYLINE 14MM (BIT) ×1 IMPLANT
BLADE SURG 15 STRL LF DISP TIS (BLADE) ×1 IMPLANT
BLADE SURG 15 STRL SS (BLADE) ×1
BLADE SURG ROTATE 9660 (MISCELLANEOUS) ×2 IMPLANT
BUR MATCHSTICK NEURO 3.0 LAGG (BURR) IMPLANT
CARTRIDGE OIL MAESTRO DRILL (MISCELLANEOUS) ×1 IMPLANT
CLOTH BEACON ORANGE TIMEOUT ST (SAFETY) ×2 IMPLANT
CLSR STERI-STRIP ANTIMIC 1/2X4 (GAUZE/BANDAGES/DRESSINGS) ×2 IMPLANT
CORDS BIPOLAR (ELECTRODE) ×2 IMPLANT
COVER SURGICAL LIGHT HANDLE (MISCELLANEOUS) ×2 IMPLANT
CRADLE DONUT ADULT HEAD (MISCELLANEOUS) ×2 IMPLANT
DEVICE ENDSKLTN CRVCL 5MM-0SM (Orthopedic Implant) ×2 IMPLANT
DEVICE ENDSKLTN IMPLNT MED 6MM (Orthopedic Implant) ×1 IMPLANT
DIFFUSER DRILL AIR PNEUMATIC (MISCELLANEOUS) ×2 IMPLANT
DRAIN JACKSON RD 7FR 3/32 (WOUND CARE) IMPLANT
DRAPE C-ARM 42X72 X-RAY (DRAPES) ×2 IMPLANT
DRAPE POUCH INSTRU U-SHP 10X18 (DRAPES) ×2 IMPLANT
DRAPE SURG 17X23 STRL (DRAPES) ×6 IMPLANT
DRILL BIT SKYLINE 14MM (BIT) ×1
DRILL NEURO 2X3.1 SOFT TOUCH (MISCELLANEOUS) ×2
DURAPREP 26ML APPLICATOR (WOUND CARE) ×2 IMPLANT
ELECT COATED BLADE 2.86 ST (ELECTRODE) ×2 IMPLANT
ELECT REM PT RETURN 9FT ADLT (ELECTROSURGICAL) ×2
ELECTRODE REM PT RTRN 9FT ADLT (ELECTROSURGICAL) ×1 IMPLANT
ENDOSKELETON CERVICAL 5MM-0SM (Orthopedic Implant) ×4 IMPLANT
ENDOSKELETON IMPLANT MED 6MM (Orthopedic Implant) ×2 IMPLANT
EVACUATOR SILICONE 100CC (DRAIN) IMPLANT
GAUZE SPONGE 4X4 16PLY XRAY LF (GAUZE/BANDAGES/DRESSINGS) ×2 IMPLANT
GLOVE BIO SURGEON STRL SZ7 (GLOVE) ×2 IMPLANT
GLOVE BIO SURGEON STRL SZ8 (GLOVE) ×2 IMPLANT
GLOVE BIOGEL PI IND STRL 7.0 (GLOVE) ×2 IMPLANT
GLOVE BIOGEL PI IND STRL 8 (GLOVE) ×1 IMPLANT
GLOVE BIOGEL PI INDICATOR 7.0 (GLOVE) ×2
GLOVE BIOGEL PI INDICATOR 8 (GLOVE) ×1
GOWN STRL NON-REIN LRG LVL3 (GOWN DISPOSABLE) ×2 IMPLANT
GOWN STRL REIN XL XLG (GOWN DISPOSABLE) ×2 IMPLANT
INTERLOCK LRDTC CRVCL VBR 6MM (Bone Implant) ×1 IMPLANT
IV CATH 14GX2 1/4 (CATHETERS) ×2 IMPLANT
KIT BASIN OR (CUSTOM PROCEDURE TRAY) ×2 IMPLANT
KIT ROOM TURNOVER OR (KITS) ×2 IMPLANT
LORDOTIC CERVICAL VBR 6MM SM (Bone Implant) ×2 IMPLANT
MANIFOLD NEPTUNE II (INSTRUMENTS) ×2 IMPLANT
NEEDLE 27GAX1X1/2 (NEEDLE) ×2 IMPLANT
NEEDLE SPNL 20GX3.5 QUINCKE YW (NEEDLE) ×2 IMPLANT
NS IRRIG 1000ML POUR BTL (IV SOLUTION) ×2 IMPLANT
OIL CARTRIDGE MAESTRO DRILL (MISCELLANEOUS) ×2
PACK ORTHO CERVICAL (CUSTOM PROCEDURE TRAY) ×2 IMPLANT
PAD ARMBOARD 7.5X6 YLW CONV (MISCELLANEOUS) ×4 IMPLANT
PATTIES SURGICAL .5 X.5 (GAUZE/BANDAGES/DRESSINGS) IMPLANT
PATTIES SURGICAL .5 X1 (DISPOSABLE) IMPLANT
PIN DISTRACTION 14 (PIN) ×2 IMPLANT
PLATE SKYLINE 60MM 4LVL (Plate) ×2 IMPLANT
PUTTY BONE DBX 5CC MIX (Putty) ×2 IMPLANT
SCREW SKYLINE VAR OS 14MM (Screw) ×12 IMPLANT
SCREW VAR SELF TAP SKYLINE 14M (Screw) ×10 IMPLANT
SPONGE GAUZE 4X4 12PLY (GAUZE/BANDAGES/DRESSINGS) ×2 IMPLANT
SPONGE INTESTINAL PEANUT (DISPOSABLE) ×4 IMPLANT
SPONGE SURGIFOAM ABS GEL 100 (HEMOSTASIS) ×2 IMPLANT
STRIP CLOSURE SKIN 1/2X4 (GAUZE/BANDAGES/DRESSINGS) ×2 IMPLANT
SURGIFLO TRUKIT (HEMOSTASIS) IMPLANT
SUT MNCRL AB 4-0 PS2 18 (SUTURE) IMPLANT
SUT VIC AB 2-0 CT2 18 VCP726D (SUTURE) ×2 IMPLANT
SYR BULB IRRIGATION 50ML (SYRINGE) ×2 IMPLANT
SYR CONTROL 10ML LL (SYRINGE) ×4 IMPLANT
TAPE CLOTH 4X10 WHT NS (GAUZE/BANDAGES/DRESSINGS) ×2 IMPLANT
TAPE CLOTH SURG 4X10 WHT LF (GAUZE/BANDAGES/DRESSINGS) ×2 IMPLANT
TAPE UMBILICAL COTTON 1/8X30 (MISCELLANEOUS) ×2 IMPLANT
TOWEL OR 17X24 6PK STRL BLUE (TOWEL DISPOSABLE) ×2 IMPLANT
TOWEL OR 17X26 10 PK STRL BLUE (TOWEL DISPOSABLE) ×2 IMPLANT
TRAY FOLEY CATH 16FRSI W/METER (SET/KITS/TRAYS/PACK) ×2 IMPLANT
WATER STERILE IRR 1000ML POUR (IV SOLUTION) ×2 IMPLANT
YANKAUER SUCT BULB TIP NO VENT (SUCTIONS) ×2 IMPLANT

## 2012-12-25 NOTE — Transfer of Care (Signed)
Immediate Anesthesia Transfer of Care Note  Patient: Laura Ellison  Procedure(s) Performed: Procedure(s) with comments: ANTERIOR CERVICAL DECOMPRESSION/DISCECTOMY FUSION 4 LEVELS (N/A) - Anterior cervical decompression fusion, cervical 4-5, cervical 5-6, cervical 6-7, cervical 7-thoracic 1 with instrumentation, allograft  Patient Location: PACU  Anesthesia Type:General  Level of Consciousness: awake, alert , oriented and patient cooperative  Airway & Oxygen Therapy: Patient Spontanous Breathing and Patient connected to face mask oxygen  Post-op Assessment: Report given to PACU RN and Post -op Vital signs reviewed and stable  Post vital signs: Reviewed  Complications: No apparent anesthesia complications

## 2012-12-25 NOTE — Anesthesia Postprocedure Evaluation (Signed)
  Anesthesia Post-op Note  Patient: Laura Ellison  Procedure(s) Performed: Procedure(s) with comments: ANTERIOR CERVICAL DECOMPRESSION/DISCECTOMY FUSION 4 LEVELS (N/A) - Anterior cervical decompression fusion, cervical 4-5, cervical 5-6, cervical 6-7, cervical 7-thoracic 1 with instrumentation, allograft  Patient Location: PACU  Anesthesia Type:General  Level of Consciousness: awake, alert , oriented and patient cooperative  Airway and Oxygen Therapy: Patient Spontanous Breathing  Post-op Pain: mild  Post-op Assessment: Post-op Vital signs reviewed, Patient's Cardiovascular Status Stable, Respiratory Function Stable, Patent Airway, No signs of Nausea or vomiting and Pain level controlled  Post-op Vital Signs: stable  Complications: No apparent anesthesia complications

## 2012-12-25 NOTE — Progress Notes (Signed)
Report given to philip rn as caregiver 

## 2012-12-25 NOTE — Preoperative (Signed)
Beta Blockers   Reason not to administer Beta Blockers:Not Applicable 

## 2012-12-25 NOTE — Anesthesia Preprocedure Evaluation (Addendum)
Anesthesia Evaluation  Patient identified by MRN, date of birth, ID band Patient awake    Reviewed: Allergy & Precautions, H&P , Patient's Chart, lab work & pertinent test results, reviewed documented beta blocker date and time   History of Anesthesia Complications Negative for: history of anesthetic complications  Airway Mallampati: II TM Distance: >3 FB Neck ROM: Limited    Dental  (+) Edentulous Upper, Dental Advisory Given and Poor Dentition   Pulmonary COPDCurrent Smoker,  breath sounds clear to auscultation        Cardiovascular hypertension, Pt. on medications and Pt. on home beta blockers + dysrhythmias Rhythm:Regular Rate:Normal     Neuro/Psych    GI/Hepatic Neg liver ROS, GERD-  Medicated and Controlled,  Endo/Other  Hyperthyroidism   Renal/GU negative Renal ROS     Musculoskeletal   Abdominal   Peds  Hematology  (+) anemia ,   Anesthesia Other Findings   Reproductive/Obstetrics negative OB ROS                         Anesthesia Physical Anesthesia Plan  ASA: III  Anesthesia Plan: General   Post-op Pain Management:    Induction: Intravenous  Airway Management Planned: Oral ETT  Additional Equipment:   Intra-op Plan:   Post-operative Plan: Extubation in OR and Possible Post-op intubation/ventilation  Informed Consent: I have reviewed the patients History and Physical, chart, labs and discussed the procedure including the risks, benefits and alternatives for the proposed anesthesia with the patient or authorized representative who has indicated his/her understanding and acceptance.   Dental advisory given  Plan Discussed with: CRNA and Surgeon  Anesthesia Plan Comments:        Anesthesia Quick Evaluation

## 2012-12-25 NOTE — Progress Notes (Signed)
Orthopedic Tech Progress Note Patient Details:  Laura Ellison [birth year] [id number]  Ortho Devices Type of Ortho Device: Philadelphia cervical collar Ortho Device/Splint Interventions: [name] 12/25/2012, 3:48 PM

## 2012-12-25 NOTE — Anesthesia Procedure Notes (Signed)
Procedure Name: Intubation Date/Time: 12/25/2012 7:36 AM Performed by: Lovie Chol Pre-anesthesia Checklist: Patient identified, Emergency Drugs available, Suction available, Patient being monitored and Timeout performed Patient Re-evaluated:Patient Re-evaluated prior to inductionOxygen Delivery Method: Circle system utilized Preoxygenation: Pre-oxygenation with 100% oxygen Intubation Type: IV induction Ventilation: Mask ventilation without difficulty Laryngoscope Size: Miller and 2 Grade View: Grade I Tube type: Oral Tube size: 8.0 mm Number of attempts: 1 Airway Equipment and Method: Stylet Placement Confirmation: ETT inserted through vocal cords under direct vision,  positive ETCO2,  CO2 detector and breath sounds checked- equal and bilateral Secured at: 22 cm Tube secured with: Tape Dental Injury: Teeth and Oropharynx as per pre-operative assessment

## 2012-12-25 NOTE — Progress Notes (Signed)
Utilization Review Completed.Laura Ellison T11/20/2014  

## 2012-12-25 NOTE — Progress Notes (Signed)
Dr Yevette Edwards by to see Laura Ellison Laura Ellison squeezing right hand now

## 2012-12-26 ENCOUNTER — Encounter (HOSPITAL_COMMUNITY): Payer: Self-pay | Admitting: Orthopedic Surgery

## 2012-12-26 NOTE — Progress Notes (Signed)
Patient provided with discharge instructions and follow up information. She was educated on importance of wearing her cervical collar at all times. SHe is going home with her husband at this time.

## 2012-12-26 NOTE — Progress Notes (Signed)
Pt reports doing well 1 Day S/P C4-T1 ACDF. Resolved arm pain with some mild residual numbness of middle 3 fingers. Expected posterior neck pain and tightness, pain well controlled on medicine, slept well. Expected post  Op swallowing difficulties, able to eat soft foods and some fruit this morning. Eager to go home. Denies SOB/breathign difficulties.  BP 152/85  Pulse 58  Temp(Src) 97.6 F (36.4 C) (Oral)  Resp 18  Ht 5\' 6"  (1.676 m)  Wt 99.791 kg (220 lb)  BMI 35.53 kg/m2  SpO2 99%  Pt sitting up comfortably in hospital bed eating fruit. Hard collar in place, worn appropriately. Dressing CDI, neck soft and supple, NVI, SCDs in place, 5/5 strength BIL hands  1 Day S/P C4-T1 ACDF for myeloradiculopathy, doing well  -Pain well controlled   Written scripts for percocet and valium signed and in paper chart for D/C  -Hard Collar at all times, philly collar for showering after 5 days  -D/C home today with husband   -D/C instructions in chart printed  -F/U in office 2 weeks

## 2012-12-26 NOTE — Op Note (Signed)
Laura Ellison, RUEGER NO.:  [id number]  MEDICAL RECORD NO.:  [id number]  LOCATION:  [id number]                        FACILITY:  [facility]  PHYSICIAN:  Estill Bamberg, MD      DATE OF BIRTH:  [birth year]  DATE OF PROCEDURE:  12/25/2012                              OPERATIVE REPORT   PREOPERATIVE DIAGNOSES: 1. Left-sided cervical radiculopathy. 2. Cervical myelopathy. 3. Varying degrees of spinal cord compression and neuroforaminal     compression spanning C4-5, C5-6, C6-7, as well as C7-T1.  POSTOPERATIVE DIAGNOSES: 1. Left-sided cervical radiculopathy. 2. Cervical myelopathy. 3. Varying degrees of spinal cord compression and neuroforaminal     compression spanning C4-5, C5-6, C6-7, as well as C7-T1.  PROCEDURE: 1. Anterior cervical decompression and fusion C4-5, C5-6, C6-7, C7-T1. 2. Placement of anterior instrumentation, C4-T1. 3. Insertion of interbody device x4 (Titan interbody spacers). 4. Use of morselized allograft. 5. Intraoperative use of fluoroscopy.  SURGEON:  Estill Bamberg, MD  ASSISTANT:  Jason Coop, PA-C  ANESTHESIA:  General endotracheal anesthesia.  COMPLICATIONS:  None.  DISPOSITION:  Stable.  ESTIMATED BLOOD LOSS:  Minimal.  INDICATIONS FOR PROCEDURE:  Laura Ellison is a very pleasant 67 year old female, who did present to me on August 23, 2012 with mainly left arm pain.  She did also report deterioration of fine motor skills and deterioration in balance.  I did review an MRI dated July 26, 2012 which was notable for myelomalacia at the C4-5 level, with obvious spinal cord compression at C4-5.  In addition, there was prominent left-sided neuroforaminal stenosis at C4-5, C5-6, C6-7 as well as C7-T1.  I did feel that the findings on her MRI did correlate to her symptoms.  Given the natural history of myelopathy, we did discuss proceeding with surgical intervention as reflected above.  The patient did clearly understand the risks and  limitations of the procedure.  Of particular note, the patient did understand that surgery is not intended to reverse symptoms associated with cervical myelopathy, but would likely prevent additional deterioration.  Also of note, the patient does smoke regularly, approximately 2-3 packs of cigarettes a day, and she has smoked for the last 50 years.  The patient did understand that her smoking status does increase her risk of a nonunion.  She was educated on the importance of quitting smoking.  OPERATIVE DETAILS:  On December 25, 2012, the patient was brought to surgery and general endotracheal anesthesia was administered.  The patient was placed supine on the hospital bed.  All bony prominences were meticulously padded.  The neck was placed in a gentle degree of extension.  The patient's arms were secured to her sides.  The neck was then prepped and draped in usual sterile fashion.  Time-out procedure was performed.  I then made a left-sided oblique incision in line with the anterior border of the sternocleidomastoid muscle.  The plane between the sternocleidomastoid muscle and the strap muscles were identified and explored.  The anterior cervical spine was readily noted. I then obtained a lateral intraoperative fluoroscopic view to confirm the appropriate operative level.  Of note, there were very prominent osteophytes noted at each level to be fused.  These were removed using  a series of a rongeurs.  I then turned my attention towards the C7-T1 interspace.  Caspar pins were placed into the C7-T1 vertebral bodies and distraction was applied.  Of note, a self-retaining Shadow-Line retractor was placed.  I then used a 15-blade knife to perform an annulotomy anteriorly.  I then performed a thorough and complete diskectomy.  Of note, there was a very prominent disk osteophyte complex noted in the region of the left neuroforamen.  I did meticulously use high-speed bur in addition to a  series of curettes and Kerrison punches to remove the offending pressure on the exiting C8 nerve.  I was able to perform an adequate decompression.  I then prepared the endplates.  Of note, it was readily noted that the patient's bone was extremely soft. This was noted when placing the Caspar pins and while preparing the endplates.  In any event, I did place a series of interbody spacer trials and I did select the appropriate size trial, which was packed with DBX mix and tamped into position in the usual fashion.  I did note an excellent press fit.  Of the implant, however, again, it was noted that the patient's bone was noted to be extremely osteoporotic, however, good press-fit was noted.  The Caspar pin was then removed from the T1 vertebral body and then placed into the C6 vertebral body and distraction was again applied.  As previously noted, a diskectomy was performed.  A thorough central and neuroforaminal decompression was performed bilaterally.  Once again, the endplates were prepared and I again placed a series of trials after preparing the endplates.  The appropriate sized interbody spacer was packed with DBX mix and tamped into position in the usual fashion.  I then performed a diskectomy at the C4-5, and then the C5-6 interspaces.  As previously described at each interspace, I did confirm adequate decompression of the spinal canal and the neuroforamen bilaterally.  At each level, trial interbody spacers were placed and the appropriately sized interbody spacer was packed with DBX mix and tamped into position.  At each vertebral body, a Caspar pin was applied, bone wax was placed in its place.  I then chose an appropriately sized anterior cervical plate, which was placed over the anterior cervical spine.  A total of 10 vertebral body screws were placed, 2 in each vertebral body from C4-T1.  I did note suboptimal purchase in various screws, given the patient's  significant osteoporosis.  However, the screws were locked into the plate using the CAM locking mechanism.  I then copiously irrigated the wound.  Of note, I did use AP and lateral fluoroscopy while placing the hardware.  I then closed the platysma using 2-0 Vicryl.  The subcutaneous layer was then closed using 2-0 Vicryl also.  The skin was then closed using 3-0 Monocryl.  Benzoin and Steri-Strips were applied followed by sterile dressing.  All instrument counts were correct at the termination of the procedure.  The patient was then placed in a hard cervical collar and transferred to recovery in stable condition.  Of note, Jason Coop was my assistant throughout the entirety of the procedure, and did aid in essential retraction and suctioning needed throughout the entire surgery.     Estill Bamberg, MD     MD/MEDQ  D:  12/25/2012  T:  12/26/2012  Job:  [id number]  cc:   Nani Gasser, M.D. Monica Becton, MD

## 2012-12-31 NOTE — Discharge Summary (Signed)
Patient ID: Laura Ellison MRN: [id number] DOB/AGE: 1945-04-27 67 y.o.  Admit date: 12/25/2012 Discharge date: 12/26/2012  Admission Diagnoses:  Myeloradiculopathy  Discharge Diagnoses:  Same  Past Medical History  Diagnosis Date  . Hypertension   . Hyperlipidemia   . thyroid problem   . Pinched nerve in neck   . Emphysema   . Family history of anesthesia complication     "son had a problem waking up"  . Dysrhythmia     Hx; of palpitations in the past  . Anxiety     Hx; of situational anxiety  . Seasonal allergies     Hx: of  . Pneumonia     Hx:of a "touch' of PNA  . Heart murmur     Hx: of as a child  . GERD (gastroesophageal reflux disease)   . Headache(784.0)   . Arthritis   . Anemia     Hx: of as a teen  . IBS (irritable bowel syndrome)     Hx; of    Surgeries: Procedure(s): ANTERIOR CERVICAL DECOMPRESSION/DISCECTOMY FUSION 4 LEVELS C4-T1 on 12/25/2012   Consultants:  Noen  Discharged Condition: Improved  Hospital Course: Laura Ellison is an 67 y.o. female who was admitted 12/25/2012 for operative treatment of myeloradiculopathy. Patient has severe unremitting pain that affects sleep, daily activities, and work/hobbies. After pre-op clearance the patient was taken to the operating room on 12/25/2012 and underwent  Procedure(s): ANTERIOR CERVICAL DECOMPRESSION/DISCECTOMY FUSION 4 LEVELS C4-T1.    Patient was given perioperative antibiotics:  Anti-infectives   Start     Dose/Rate Route Frequency Ordered Stop   12/25/12 2000  vancomycin (VANCOCIN) 1,250 mg in sodium chloride 0.9 % 250 mL IVPB     1,250 mg 166.7 mL/hr over 90 Minutes Intravenous  Once 12/25/12 1628 12/25/12 2132   12/25/12 0600  vancomycin (VANCOCIN) IVPB 1000 mg/200 mL premix     1,000 mg 200 mL/hr over 60 Minutes Intravenous On call to O.R. 12/24/12 1437 12/25/12 0720       Patient was given sequential compression devices, early ambulation to prevent DVT.  Patient benefited  maximally from hospital stay and there were no complications.    Recent vital signs: BP 152/85  Pulse 58  Temp(Src) 97.6 F (36.4 C) (Oral)  Resp 18  Ht 5\' 6"  (1.676 m)  Wt 99.791 kg (220 lb)  BMI 35.53 kg/m2  SpO2 99%  Discharge Medications:     Medication List    STOP taking these medications       SUPER B COMPLEX PO     TYLENOL ARTHRITIS PAIN PO      TAKE these medications       albuterol (2.5 MG/3ML) 0.083% nebulizer solution  Commonly known as:  PROVENTIL  Take 3 mLs (2.5 mg total) by nebulization every 4 (four) hours as needed for wheezing.     albuterol 108 (90 BASE) MCG/ACT inhaler  Commonly known as:  VENTOLIN HFA  Inhale 2 puffs into the lungs every 6 (six) hours as needed for wheezing.     ALPRAZolam 0.25 MG tablet  Commonly known as:  XANAX  Take 0.25 mg by mouth at bedtime as needed for anxiety.     CENTRUM SILVER ULTRA WOMENS PO  Take 1 tablet by mouth daily.     fexofenadine 180 MG tablet  Commonly known as:  ALLEGRA  Take 180 mg by mouth every morning.     levocetirizine 5 MG tablet  Commonly known as:  XYZAL  Take 5 mg by mouth daily. Take 50 mg at noon.     losartan 50 MG tablet  Commonly known as:  COZAAR  Take 50 mg by mouth daily. Take 50 mg at noon.     methimazole 10 MG tablet  Commonly known as:  TAPAZOLE  Take 10 mg by mouth every morning.     metoprolol tartrate 25 MG tablet  Commonly known as:  LOPRESSOR  Take 25 mg by mouth 2 (two) times daily. 1 at noon, 1 at bedtime.     omeprazole 40 MG capsule  Commonly known as:  PRILOSEC  Take 40 mg by mouth every morning.     vitamin B-12 1000 MCG tablet  Commonly known as:  CYANOCOBALAMIN  Take 1,000 mcg by mouth every other day.     Vitamin D3 1000 UNITS Caps  Take 2 capsules by mouth daily.        Diagnostic Studies: Dg Chest 2 View  12/23/2012   CLINICAL DATA:  Preoperative evaluation.  EXAM: CHEST  2 VIEW  COMPARISON:  None.  FINDINGS: Normal cardiac and mediastinal  contours. No consolidative pulmonary opacities. No pleural effusion or pneumothorax. Mid thoracic spine degenerative change.  IMPRESSION: No acute cardiopulmonary process.   Electronically Signed   By: Annia Belt M.D.   On: 12/23/2012 13:36   Dg Cervical Spine 1 View  12/25/2012   CLINICAL DATA:  Neck pain.  EXAM: DG CERVICAL SPINE - 1 VIEW; DG C-ARM 1-60 MIN  COMPARISON:  MRI 07/26/2012.  FINDINGS: C-arm films document C4 through T1 ACDF. The lower aspect of the fusion construct is poorly visualized due to lack of shoulder penetration. Recommend plain films postoperatively to assess positioning.  IMPRESSION: C4-T1 fusion as described.   Electronically Signed   By: Davonna Belling M.D.   On: 12/25/2012 16:33   Dg C-arm 1-60 Min  12/25/2012   CLINICAL DATA:  Neck pain.  EXAM: DG CERVICAL SPINE - 1 VIEW; DG C-ARM 1-60 MIN  COMPARISON:  MRI 07/26/2012.  FINDINGS: C-arm films document C4 through T1 ACDF. The lower aspect of the fusion construct is poorly visualized due to lack of shoulder penetration. Recommend plain films postoperatively to assess positioning.  IMPRESSION: C4-T1 fusion as described.   Electronically Signed   By: Davonna Belling M.D.   On: 12/25/2012 16:33    Disposition: 01-Home or Self Care   1 Day S/P C4-T1 ACDF for myeloradiculopathy, doing well  -Pain well controlled  Written scripts for percocet and valium signed and in paper chart for D/C  -Hard Collar at all times, philly collar for showering after 5 days  -D/C home today with husband  -D/C instructions in chart printed  -F/U in office 2 weeks    Signed: Georga Bora 12/31/2012, 10:30 AM

## 2013-01-02 ENCOUNTER — Other Ambulatory Visit: Payer: Self-pay | Admitting: Family Medicine

## 2013-01-06 ENCOUNTER — Encounter: Payer: Self-pay | Admitting: Emergency Medicine

## 2013-01-06 ENCOUNTER — Emergency Department (INDEPENDENT_AMBULATORY_CARE_PROVIDER_SITE_OTHER)
Admission: EM | Admit: 2013-01-06 | Discharge: 2013-01-06 | Disposition: A | Discharge status: Home / Self Care | Payer: Medicare HMO | Source: Home / Self Care | Attending: Emergency Medicine | Admitting: Emergency Medicine

## 2013-01-06 DIAGNOSIS — R319 Hematuria, unspecified: Secondary | ICD-10-CM

## 2013-01-06 DIAGNOSIS — R300 Dysuria: Secondary | ICD-10-CM

## 2013-01-06 DIAGNOSIS — N390 Urinary tract infection, site not specified: Secondary | ICD-10-CM

## 2013-01-06 LAB — POCT URINALYSIS DIP (MANUAL ENTRY)
Glucose, UA: NEGATIVE
Nitrite, UA: NEGATIVE
Protein Ur, POC: >=300
Urobilinogen, UA: 2 (ref 0–1)
pH, UA: 5 (ref 5–8)

## 2013-01-06 MED ORDER — CEFTRIAXONE SODIUM 1 G IJ SOLR
1.0000 g | Freq: Once | INTRAMUSCULAR | Status: AC
  Administered 2013-01-06: 1 g via INTRAMUSCULAR

## 2013-01-06 MED ORDER — SULFAMETHOXAZOLE-TRIMETHOPRIM 800-160 MG PO TABS: 1.0000 | ORAL_TABLET | Freq: Two times a day (BID) | ORAL | Status: DC

## 2013-01-06 NOTE — ED Notes (Signed)
Pt c/o hematuria x today. Denies fever. She reports having a catheter on 12/25/12 for neck surgery.

## 2013-01-06 NOTE — ED Provider Notes (Addendum)
CSN: [id number]     Arrival date & time 01/06/13  1824 History   First MD Initiated Contact with Patient 01/06/13 1828     Chief Complaint  Patient presents with  . Hematuria   (Consider location/radiation/quality/duration/timing/severity/associated sxs/prior Treatment) HPI Laura Ellison is a 67 y.o. female who presents today with urinary symptoms for 1 day.  She had back surgery 12 days ago and had a catheter in during the surgery and for the day following. + dysuria + frequency No urgency + hematuria No vaginal discharge No fever/chills No lower abdominal pain No back pain + fatigue    Past Medical History  Diagnosis Date  . Hypertension   . Hyperlipidemia   . thyroid problem   . Pinched nerve in neck   . Emphysema   . Family history of anesthesia complication     "son had a problem waking up"  . Dysrhythmia     Hx; of palpitations in the past  . Anxiety     Hx; of situational anxiety  . Seasonal allergies     Hx: of  . Pneumonia     Hx:of a "touch' of PNA  . Heart murmur     Hx: of as a child  . GERD (gastroesophageal reflux disease)   . Headache(784.0)   . Arthritis   . Anemia     Hx: of as a teen  . IBS (irritable bowel syndrome)     Hx; of   Past Surgical History  Procedure Laterality Date  . Cholecystectomy, laparoscopic  07/13/2010  . Ablation      Hx: of  . Cholecystectomy    . Appendectomy    . Tonsillectomy    . Cyst excision      Hx: of several  . Cardiac catheterization    . Eye surgery      Hx: of cyst removed from left eye  . Fine needle aspiration      Hx: of right breast  . Anterior cervical decompression/discectomy fusion 4 levels N/A 12/25/2012    Procedure: ANTERIOR CERVICAL DECOMPRESSION/DISCECTOMY FUSION 4 LEVELS;  Surgeon: Emilee Hero, MD;  Location: [facility];  Service: Orthopedics;  Laterality: N/A;  Anterior cervical decompression fusion, cervical 4-5, cervical 5-6, cervical 6-7, cervical 7-thoracic 1 with instrumentation,  allograft   Family History  Problem Relation Age of Onset  . Lymphoma Father   . Diabetes Maternal Grandmother   . Stroke Maternal Grandmother   . Aortic aneurysm Mother     died from  . Stroke Maternal Uncle   . Heart attack      Mothers family  . Aortic aneurysm Mother     Deceased   History  Substance Use Topics  . Smoking status: Current Every Day Smoker -- 2.50 packs/day for 50 years    Types: Cigarettes  . Smokeless tobacco: Never Used  . Alcohol Use: Yes     Comment: " occasional"   OB History   Grav Para Term Preterm Abortions TAB SAB Ect Mult Living                 Review of Systems  Allergies  Amoxicillin-pot clavulanate; Crestor; Fenofibrate; Lipitor; Other; Pravachol; Prednisone; Welchol; and Zocor  Home Medications   Current Outpatient Rx  Name  Route  Sig  Dispense  Refill  . diazepam (VALIUM) 5 MG tablet   Oral   Take 2.5 mg by mouth every 8 (eight) hours as needed for muscle spasms.         Marland Kitchen  EXPIRED: albuterol (PROVENTIL) (2.5 MG/3ML) 0.083% nebulizer solution   Nebulization   Take 3 mLs (2.5 mg total) by nebulization every 4 (four) hours as needed for wheezing.   75 mL   12   . albuterol (VENTOLIN HFA) 108 (90 BASE) MCG/ACT inhaler   Inhalation   Inhale 2 puffs into the lungs every 6 (six) hours as needed for wheezing.   1 Inhaler   2   . ALPRAZolam (XANAX) 0.25 MG tablet   Oral   Take 0.25 mg by mouth at bedtime as needed for anxiety.         . Cholecalciferol (VITAMIN D3) 1000 UNITS CAPS   Oral   Take 2 capsules by mouth daily.          . fexofenadine (ALLEGRA) 180 MG tablet   Oral   Take 180 mg by mouth every morning.          Marland Kitchen EXPIRED: levocetirizine (XYZAL) 5 MG tablet   Oral   Take 5 mg by mouth daily. Take 50 mg at noon.         Marland Kitchen losartan (COZAAR) 100 MG tablet      TAKE 1 TABLET BY MOUTH DAILY   90 tablet   0     Please schedule follow-up appointment for blood pr ...   . methimazole (TAPAZOLE) 10 MG  tablet   Oral   Take 10 mg by mouth every morning.          . metoprolol tartrate (LOPRESSOR) 25 MG tablet      TAKE 1 TABLET BY MOUTH TWICE DAILY   180 tablet   0     Please schedule follow-up appointment for blood pr ...   . Multiple Vitamins-Minerals (CENTRUM SILVER ULTRA WOMENS PO)   Oral   Take 1 tablet by mouth daily.          Marland Kitchen omeprazole (PRILOSEC) 40 MG capsule   Oral   Take 40 mg by mouth every morning.          . sulfamethoxazole-trimethoprim (SEPTRA DS) 800-160 MG per tablet   Oral   Take 1 tablet by mouth 2 (two) times daily.   14 tablet   0   . vitamin B-12 (CYANOCOBALAMIN) 1000 MCG tablet   Oral   Take 1,000 mcg by mouth every other day.          BP 149/84  Pulse 80  Temp(Src) 98 F (36.7 C) (Oral)  Resp 16  Ht 5\' 6"  (1.676 m)  Wt 194 lb (87.998 kg)  BMI 31.33 kg/m2  SpO2 97% Physical Exam  Nursing note and vitals reviewed. Constitutional: She is oriented to person, place, and time. She appears well-developed and well-nourished.  Wearing neck brace  HENT:  Head: Normocephalic and atraumatic.  Eyes: No scleral icterus.  Neck: Neck supple.  Cardiovascular: Regular rhythm and normal heart sounds.   Pulmonary/Chest: Effort normal and breath sounds normal. No respiratory distress.  Abdominal: Soft. Normal appearance and bowel sounds are normal. She exhibits no mass. There is no tenderness. There is no rebound, no guarding and no CVA tenderness.  Neurological: She is alert and oriented to person, place, and time.  Skin: Skin is warm and dry.  Psychiatric: She has a normal mood and affect. Her speech is normal.    ED Course  Procedures (including critical care time) Labs Review Labs Reviewed  URINE CULTURE  POCT URINALYSIS DIP (MANUAL ENTRY)   Imaging Review No results found.  EKG  Interpretation    Date/Time:    Ventricular Rate:    PR Interval:    QRS Duration:   QT Interval:    QTC Calculation:   R Axis:     Text  Interpretation:             Results for orders placed during the hospital encounter of 01/06/13  POCT URINALYSIS DIP (MANUAL ENTRY)      Result Value Range   Color, UA       Clarity, UA       Glucose, UA neg     Bilirubin, UA large     Bilirubin, UA small (15)     Spec Grav, UA 1.015  1.005 - 1.03   Blood, UA large     pH, UA 5.0  5 - 8   Protein Ur, POC >=300     Urobilinogen, UA 2.0  0 - 1   Nitrite, UA Negative     Leukocytes, UA large (3+)       MDM   1. Dysuria   2. Hematuria    Complicated UTI, likely induced by catheter last week, but just started today.  Will give her a shot of 1 gram of Rocephin IM today in clinic and then take Rx of Bactrim for the next week.  A culture was done and we can modify treatment if necessary, e.g. to a fluoroquinolone. Follow up with your PCP or urologist if not improving or if worsening symptoms.   Marlaine Hind, MD 01/06/13 1905  Marlaine Hind, MD 01/06/13 [phone]

## 2013-01-09 LAB — URINE CULTURE: Colony Count: >=100000

## 2013-01-11 ENCOUNTER — Telehealth: Payer: Self-pay | Admitting: Emergency Medicine

## 2013-01-26 ENCOUNTER — Other Ambulatory Visit: Payer: Self-pay | Admitting: Family Medicine

## 2013-02-15 ENCOUNTER — Other Ambulatory Visit: Payer: Self-pay | Admitting: Family Medicine

## 2013-03-11 ENCOUNTER — Emergency Department (INDEPENDENT_AMBULATORY_CARE_PROVIDER_SITE_OTHER)
Admission: EM | Admit: 2013-03-11 | Discharge: 2013-03-11 | Disposition: A | Discharge status: Home / Self Care | Payer: Medicare HMO | Source: Home / Self Care | Attending: Family Medicine | Admitting: Family Medicine

## 2013-03-11 ENCOUNTER — Encounter: Payer: Self-pay | Admitting: Emergency Medicine

## 2013-03-11 DIAGNOSIS — J069 Acute upper respiratory infection, unspecified: Secondary | ICD-10-CM

## 2013-03-11 DIAGNOSIS — J0100 Acute maxillary sinusitis, unspecified: Secondary | ICD-10-CM

## 2013-03-11 MED ORDER — SULFAMETHOXAZOLE-TMP DS 800-160 MG PO TABS: 1.0000 | ORAL_TABLET | Freq: Two times a day (BID) | ORAL | Status: DC

## 2013-03-11 MED ORDER — BENZONATATE 200 MG PO CAPS: 200.0000 mg | ORAL_CAPSULE | Freq: Every day | ORAL | Status: DC

## 2013-03-11 NOTE — ED Provider Notes (Signed)
CSN: [id number]     Arrival date & time 03/11/13  1723 History   First MD Initiated Contact with Patient 03/11/13 1801     Chief Complaint  Patient presents with  . Facial Pain  . Otalgia  . Nasal Congestion      HPI Comments: Three days ago patient developed mild left sinus congestion that has worsened, she she now has discomfort in her left maxillary gingiva (she is edentulous) and left facial pressure.  Her usual smoker's cough has increased, and is partly productive.  She is fatigued.  No fevers, chills, and sweats.  She continues to smoke.  She denies shortness of breath   The history is provided by the patient and the spouse.    Past Medical History  Diagnosis Date  . Hypertension   . Hyperlipidemia   . thyroid problem   . Pinched nerve in neck   . Emphysema   . Family history of anesthesia complication     "son had a problem waking up"  . Dysrhythmia     Hx; of palpitations in the past  . Anxiety     Hx; of situational anxiety  . Seasonal allergies     Hx: of  . Pneumonia     Hx:of a "touch' of PNA  . Heart murmur     Hx: of as a child  . GERD (gastroesophageal reflux disease)   . Headache(784.0)   . Arthritis   . Anemia     Hx: of as a teen  . IBS (irritable bowel syndrome)     Hx; of   Past Surgical History  Procedure Laterality Date  . Cholecystectomy, laparoscopic  07/13/2010  . Ablation      Hx: of  . Cholecystectomy    . Appendectomy    . Tonsillectomy    . Cyst excision      Hx: of several  . Cardiac catheterization    . Eye surgery      Hx: of cyst removed from left eye  . Fine needle aspiration      Hx: of right breast  . Anterior cervical decompression/discectomy fusion 4 levels N/A 12/25/2012    Procedure: ANTERIOR CERVICAL DECOMPRESSION/DISCECTOMY FUSION 4 LEVELS;  Surgeon: Sinclair Ship, MD;  Location: [facility];  Service: Orthopedics;  Laterality: N/A;  Anterior cervical decompression fusion, cervical 4-5, cervical 5-6, cervical 6-7,  cervical 7-thoracic 1 with instrumentation, allograft   Family History  Problem Relation Age of Onset  . Lymphoma Father   . Diabetes Maternal Grandmother   . Stroke Maternal Grandmother   . Aortic aneurysm Mother     died from  . Stroke Maternal Uncle   . Heart attack      Mothers family  . Aortic aneurysm Mother     Deceased   History  Substance Use Topics  . Smoking status: Current Every Day Smoker -- 2.50 packs/day for 50 years    Types: Cigarettes  . Smokeless tobacco: Never Used  . Alcohol Use: Yes     Comment: " occasional"   OB History   Grav Para Term Preterm Abortions TAB SAB Ect Mult Living                 Review of Systems No sore throat + cough No pleuritic pain No wheezing + nasal congestion + post-nasal drainage + sinus pain/pressure No itchy/red eyes No earache No hemoptysis No SOB No fever/chills No nausea No vomiting No abdominal pain No diarrhea No urinary  symptoms No skin rash + fatigue No myalgias + headache Used OTC meds without relief  Allergies  Amoxicillin-pot clavulanate; Crestor; Fenofibrate; Lipitor; Other; Pravachol; Prednisone; Welchol; and Zocor  Home Medications   Current Outpatient Rx  Name  Route  Sig  Dispense  Refill  . EXPIRED: albuterol (PROVENTIL) (2.5 MG/3ML) 0.083% nebulizer solution   Nebulization   Take 3 mLs (2.5 mg total) by nebulization every 4 (four) hours as needed for wheezing.   75 mL   12   . albuterol (VENTOLIN HFA) 108 (90 BASE) MCG/ACT inhaler   Inhalation   Inhale 2 puffs into the lungs every 6 (six) hours as needed for wheezing.   1 Inhaler   2   . ALPRAZolam (XANAX) 0.25 MG tablet   Oral   Take 0.25 mg by mouth at bedtime as needed for anxiety.         . benzonatate (TESSALON) 200 MG capsule   Oral   Take 1 capsule (200 mg total) by mouth at bedtime. Take as needed for cough   12 capsule   0   . Cholecalciferol (VITAMIN D3) 1000 UNITS CAPS   Oral   Take 2 capsules by mouth  daily.          . diazepam (VALIUM) 5 MG tablet   Oral   Take 2.5 mg by mouth every 8 (eight) hours as needed for muscle spasms.         . fexofenadine (ALLEGRA) 180 MG tablet   Oral   Take 180 mg by mouth every morning.          Marland Kitchen EXPIRED: levocetirizine (XYZAL) 5 MG tablet   Oral   Take 5 mg by mouth daily. Take 50 mg at noon.         Marland Kitchen losartan (COZAAR) 100 MG tablet      TAKE 1 TABLET BY MOUTH DAILY   90 tablet   0     Please schedule follow-up appointment for blood pr ...   . losartan (COZAAR) 100 MG tablet      TAKE 1 TABLET BY MOUTH DAILY   30 tablet   0     Needs appointment   . methimazole (TAPAZOLE) 10 MG tablet   Oral   Take 10 mg by mouth every morning.          . metoprolol tartrate (LOPRESSOR) 25 MG tablet      TAKE 1 TABLET BY MOUTH TWICE DAILY   180 tablet   0     Please schedule follow-up appointment for blood pr ...   . Multiple Vitamins-Minerals (CENTRUM SILVER ULTRA WOMENS PO)   Oral   Take 1 tablet by mouth daily.          Marland Kitchen omeprazole (PRILOSEC) 40 MG capsule   Oral   Take 40 mg by mouth every morning.          Marland Kitchen omeprazole (PRILOSEC) 40 MG capsule      TAKE ONE CAPSULE BY MOUTH EVERY DAY   90 capsule   0   . sulfamethoxazole-trimethoprim (BACTRIM DS) 800-160 MG per tablet   Oral   Take 1 tablet by mouth 2 (two) times daily.   20 tablet   0   . vitamin B-12 (CYANOCOBALAMIN) 1000 MCG tablet   Oral   Take 1,000 mcg by mouth every other day.          BP 137/76  Pulse 77  Temp(Src) 98.6 F (37 C) (Oral)  Resp 16  Wt 191 lb (86.637 kg)  SpO2 98% Physical Exam Nursing notes and Vital Signs reviewed. Appearance:  Patient appears stated age, and in no acute distress.  Patient is obese. Eyes:  Pupils are equal, round, and reactive to light and accomodation.  Extraocular movement is intact.  Conjunctivae are not inflamed  Ears:  Canals normal.  Tympanic membranes normal.  Nose:   Congested turbinates.  Left  maxillary sinus tenderness is present.  Pharynx:  Normal Neck:  Supple.   Tender shotty left posterior nodes  Lungs:  Clear to auscultation.  Breath sounds are equal.  Heart:  Regular rate and rhythm without murmurs, rubs, or gallops.  Abdomen:  Nontender without masses or hepatosplenomegaly.  Bowel sounds are present.  No CVA or flank tenderness.  Extremities:  Trace edema.  No calf tenderness Skin:  No rash present.   ED Course  Procedures  none       MDM   1. Acute upper respiratory infections of unspecified site; suspect viral URI   2. Acute maxillary sinusitis    Begin Septra DS (she has taken without adverse effect in the past).  Prescription written for Benzonatate [facility]) to take at bedtime for night-time cough.  Take plain Mucinex (1200 mg guaifenesin) twice daily for cough and congestion.  Increase fluid intake, rest. May use Afrin nasal spray (or generic oxymetazoline) twice daily for about 5 days.  Also recommend using saline nasal spray several times daily and saline nasal irrigation (AYR is a common brand) Try warm salt water gargles for sore throat.  Stop all antihistamines for now, and other non-prescription cough/cold preparations. Follow-up with family doctor if not improving 7 to 10 days.     Kandra Nicolas, MD 03/13/13 [id number]

## 2013-03-11 NOTE — Discharge Instructions (Signed)
Take plain Mucinex (1200 mg guaifenesin) twice daily for cough and congestion.  Increase fluid intake, rest. °May use Afrin nasal spray (or generic oxymetazoline) twice daily for about 5 days.  Also recommend using saline nasal spray several times daily and saline nasal irrigation (AYR is a common brand) °Try warm salt water gargles for sore throat.  °Stop all antihistamines for now, and other non-prescription cough/cold preparations. °  °Follow-up with family doctor if not improving 7 to 10 days.  °

## 2013-03-11 NOTE — ED Notes (Signed)
Pt c/o LT ear ache, LT side facial pain,and nasal congestion x 3 days. Denies fever,

## 2013-03-13 ENCOUNTER — Ambulatory Visit (INDEPENDENT_AMBULATORY_CARE_PROVIDER_SITE_OTHER): Payer: Medicare HMO | Admitting: Physician Assistant

## 2013-03-13 ENCOUNTER — Ambulatory Visit: Payer: Medicare HMO | Admitting: Family Medicine

## 2013-03-13 ENCOUNTER — Encounter: Payer: Self-pay | Admitting: Physician Assistant

## 2013-03-13 VITALS — BP 178/77 | HR 94 | Wt 193.0 lb

## 2013-03-13 DIAGNOSIS — I10 Essential (primary) hypertension: Secondary | ICD-10-CM

## 2013-03-13 DIAGNOSIS — M7989 Other specified soft tissue disorders: Secondary | ICD-10-CM

## 2013-03-13 DIAGNOSIS — E010 Iodine-deficiency related diffuse (endemic) goiter: Secondary | ICD-10-CM

## 2013-03-13 DIAGNOSIS — L299 Pruritus, unspecified: Secondary | ICD-10-CM

## 2013-03-13 DIAGNOSIS — E049 Nontoxic goiter, unspecified: Secondary | ICD-10-CM

## 2013-03-13 DIAGNOSIS — E0590 Thyrotoxicosis, unspecified without thyrotoxic crisis or storm: Secondary | ICD-10-CM

## 2013-03-13 LAB — COMPLETE METABOLIC PANEL WITH GFR
ALBUMIN: 4.2 g/dL (ref 3.5–5.2)
ALK PHOS: 97 U/L (ref 39–117)
ALT: 13 U/L (ref 0–35)
AST: 17 U/L (ref 0–37)
BUN: 13 mg/dL (ref 6–23)
CALCIUM: 9.5 mg/dL (ref 8.4–10.5)
CO2: 19 mEq/L (ref 19–32)
Chloride: 107 mEq/L (ref 96–112)
Creat: 0.93 mg/dL (ref 0.50–1.10)
GFR, EST NON AFRICAN AMERICAN: 64 mL/min
GFR, Est African American: 74 mL/min
GLUCOSE: 92 mg/dL (ref 70–99)
POTASSIUM: 4.4 meq/L (ref 3.5–5.3)
SODIUM: 138 meq/L (ref 135–145)
TOTAL PROTEIN: 7.1 g/dL (ref 6.0–8.3)
Total Bilirubin: 0.3 mg/dL (ref 0.2–1.2)

## 2013-03-13 LAB — CBC
HCT: 37.3 % (ref 36.0–46.0)
Hemoglobin: 12.7 g/dL (ref 12.0–15.0)
MCH: 27.7 pg (ref 26.0–34.0)
MCHC: 34 g/dL (ref 30.0–36.0)
MCV: 81.4 fL (ref 78.0–100.0)
PLATELETS: 450 10*3/uL — AB (ref 150–400)
RBC: 4.58 MIL/uL (ref 3.87–5.11)
RDW: 17.2 % — AB (ref 11.5–15.5)
WBC: 15.2 10*3/uL — AB (ref 4.0–10.5)

## 2013-03-13 MED ORDER — HYDROCHLOROTHIAZIDE 12.5 MG PO TABS: 25.0000 mg | ORAL_TABLET | Freq: Every day | ORAL | Status: DC

## 2013-03-13 MED ORDER — TRIAMCINOLONE ACETONIDE 0.5 % EX OINT: 1.0000 "application " | TOPICAL_OINTMENT | Freq: Two times a day (BID) | CUTANEOUS | Status: DC

## 2013-03-13 NOTE — Progress Notes (Signed)
   Subjective:    Patient ID: Laura Ellison, female    DOB: 1945-06-20, 68 y.o.   MRN: [id number]  HPI  Patient is a 68 year old female who presents to the clinic with bilateral ankle/leg discomfort with tingling and itchiness. Symptoms started back in the summer and were somewhat controlled with lotion. In November after her spinal surgery her symptoms seem to be getting worse. She notices when she gets out of the shower her legs are very itchy and it seems that she has stress and. She can scratch so hard at some points that her skin falls off. Wearing stockings or socks makes itching worse. She has noticed also some swelling of her legs. I then tried lotion she has done nothing to make better.  Hypertension-she has been taking her blood pressure medications regularly. She admits to being an argument before office visit today. She feels like her blood pressures for the most part are controlled. She's also been coughing from acute bronchitis and sinusitis she has antibiotic for.  Pt wants new referral to endocrinologist. She does not get alone with current one. She would like to see one in [place].    Review of Systems     Objective:   Physical Exam  Constitutional: She is oriented to person, place, and time. She appears well-developed and well-nourished.  Cardiovascular: Normal rate, regular rhythm and normal heart sounds.   Pulmonary/Chest: Effort normal and breath sounds normal. She has no wheezes.  Neurological: She is alert and oriented to person, place, and time. Coordination normal.  Skin:  1+ edema pitting edema in legs bilaterally. Stretched skin with scales over both legs. Mild erythema.   Psychiatric: She has a normal mood and affect. Her behavior is normal.          Assessment & Plan:  Lower extremity edema/bilateral leg pain- I feel like ankles are swelling and pulling at skin causing, itchiness, discomfort. Would like to try HCTZ daily 12.5mg  up to 2 tablets daily. Gave  triamcinolone cream to try for itchiness and dryness. Keep moisturized with cetaphil. Keep elevated when can. For itchiness will check CBC for RBC count. CmP for kidney function before starting diuretic.  Follow up in 1 month.   HTN- Pt is recoverying from illness and coughing a lot. She also got in argument before office visit. Will recheck in 1 month to make sure staying controlled. Controlled in past. Gave HO for low salt diet.   Hyperthyroidism- will refer to endocrinologist in [place].

## 2013-03-13 NOTE — Patient Instructions (Addendum)
Watch salt intake. Elevate feet. Start HCTZ 2 tabs daily. Follow up in 76month.  Will refer to endocrinology.  1.5 Gram Low Sodium Diet A 1.5 gram sodium diet restricts the amount of sodium in the diet to no more than 1.5 g or 1500 mg daily. The American Heart Association recommends Americans over the age of 75 to consume no more than 1500 mg of sodium each day to reduce the risk of developing high blood pressure. Research also shows that limiting sodium may reduce heart attack and stroke risk. Many foods contain sodium for flavor and sometimes as a preservative. When the amount of sodium in a diet needs to be low, it is important to know what to look for when choosing foods and drinks. The following includes some information and guidelines to help make it easier for you to adapt to a low sodium diet. QUICK TIPS  Do not add salt to food.  Avoid convenience items and fast food.  Choose unsalted snack foods.  Buy lower sodium products, often labeled as "lower sodium" or "no salt added."  Check food labels to learn how much sodium is in 1 serving.  When eating at a restaurant, ask that your food be prepared with less salt or none, if possible. READING FOOD LABELS FOR SODIUM INFORMATION The nutrition facts label is a good place to find how much sodium is in foods. Look for products with no more than 400 mg of sodium per serving. Remember that 1.5 g = 1500 mg. The food label may also list foods as:  Sodium-free: Less than 5 mg in a serving.  Very low sodium: 35 mg or less in a serving.  Low-sodium: 140 mg or less in a serving.  Light in sodium: 50% less sodium in a serving. For example, if a food that usually has 300 mg of sodium is changed to become light in sodium, it will have 150 mg of sodium.  Reduced sodium: 25% less sodium in a serving. For example, if a food that usually has 400 mg of sodium is changed to reduced sodium, it will have 300 mg of sodium. CHOOSING  FOODS Grains  Avoid: Salted crackers and snack items. Some cereals, including instant hot cereals. Bread stuffing and biscuit mixes. Seasoned rice or pasta mixes.  Choose: Unsalted snack items. Low-sodium cereals, oats, puffed wheat and rice, shredded wheat. English muffins and bread. Pasta. Meats  Avoid: Salted, canned, smoked, spiced, pickled meats, including fish and poultry. Bacon, ham, sausage, cold cuts, hot dogs, anchovies.  Choose: Low-sodium canned tuna and salmon. Fresh or frozen meat, poultry, and fish. Dairy  Avoid: Processed cheese and spreads. Cottage cheese. Buttermilk and condensed milk. Regular cheese.  Choose: Milk. Low-sodium cottage cheese. Yogurt. Sour cream. Low-sodium cheese. Fruits and Vegetables  Avoid: Regular canned vegetables. Regular canned tomato sauce and paste. Frozen vegetables in sauces. Olives. Laura Ellison. Relishes. Sauerkraut.  Choose: Low-sodium canned vegetables. Low-sodium tomato sauce and paste. Frozen or fresh vegetables. Fresh and frozen fruit. Condiments  Avoid: Canned and packaged gravies. Worcestershire sauce. Tartar sauce. Barbecue sauce. Soy sauce. Steak sauce. Ketchup. Onion, garlic, and table salt. Meat flavorings and tenderizers.  Choose: Fresh and dried herbs and spices. Low-sodium varieties of mustard and ketchup. Lemon juice. Tabasco sauce. Horseradish. SAMPLE 1.5 GRAM SODIUM MEAL PLAN Breakfast / Sodium (mg)  1 cup low-fat milk / 143 mg  1 whole-wheat English muffin / 240 mg  1 tbs heart-healthy margarine / 153 mg  1 hard-boiled egg / 139 mg  1 small orange / 0 mg Lunch / Sodium (mg)  1 cup raw carrots / 76 mg  2 tbs no salt added peanut butter / 5 mg  2 slices whole-wheat bread / 270 mg  1 tbs jelly / 6 mg   cup red grapes / 2 mg Dinner / Sodium (mg)  1 cup whole-wheat pasta / 2 mg  1 cup low-sodium tomato sauce / 73 mg  3 oz lean ground beef / 57 mg  1 small side salad (1 cup raw spinach leaves,  cup  cucumber,  cup yellow bell pepper) with 1 tsp olive oil and 1 tsp red wine vinegar / 25 mg Snack / Sodium (mg)  1 container low-fat vanilla yogurt / 107 mg  3 graham cracker squares / 127 mg Nutrient Analysis  Calories: 1745  Protein: 75 g  Carbohydrate: 237 g  Fat: 57 g  Sodium: 1425 mg Document Released: 01/22/2005 Document Revised: 04/16/2011 Document Reviewed: 04/25/2009 [employer] Patient Information 2014 [place], [place].  Peripheral Edema You have swelling in your legs (peripheral edema). This swelling is due to excess accumulation of salt and water in your body. Edema may be a sign of heart, kidney or liver disease, or a side effect of a medication. It may also be due to problems in the leg veins. Elevating your legs and using special support stockings may be very helpful, if the cause of the swelling is due to poor venous circulation. Avoid long periods of standing, whatever the cause. Treatment of edema depends on identifying the cause. Chips, pretzels, pickles and other salty foods should be avoided. Restricting salt in your diet is almost always needed. Water pills (diuretics) are often used to remove the excess salt and water from your body via urine. These medicines prevent the kidney from reabsorbing sodium. This increases urine flow. Diuretic treatment may also result in lowering of potassium levels in your body. Potassium supplements may be needed if you have to use diuretics daily. Daily weights can help you keep track of your progress in clearing your edema. You should call your caregiver for follow up care as recommended. SEEK IMMEDIATE MEDICAL CARE IF:   You have increased swelling, pain, redness, or heat in your legs.  You develop shortness of breath, especially when lying down.  You develop chest or abdominal pain, weakness, or fainting.  You have a fever. Document Released: 03/01/2004 Document Revised: 04/16/2011 Document Reviewed: 02/09/2009 [facility] Patient  Information 2014 [place].

## 2013-04-15 ENCOUNTER — Ambulatory Visit (INDEPENDENT_AMBULATORY_CARE_PROVIDER_SITE_OTHER): Payer: Medicare HMO | Admitting: Family Medicine

## 2013-04-15 ENCOUNTER — Encounter: Payer: Self-pay | Admitting: Family Medicine

## 2013-04-15 VITALS — BP 155/79 | HR 97 | Wt 189.0 lb

## 2013-04-15 DIAGNOSIS — D72829 Elevated white blood cell count, unspecified: Secondary | ICD-10-CM

## 2013-04-15 DIAGNOSIS — L299 Pruritus, unspecified: Secondary | ICD-10-CM

## 2013-04-15 DIAGNOSIS — R600 Localized edema: Secondary | ICD-10-CM

## 2013-04-15 DIAGNOSIS — R609 Edema, unspecified: Secondary | ICD-10-CM

## 2013-04-15 DIAGNOSIS — E559 Vitamin D deficiency, unspecified: Secondary | ICD-10-CM

## 2013-04-15 LAB — CBC WITH DIFFERENTIAL/PLATELET
BASOS ABS: 0.1 10*3/uL (ref 0.0–0.1)
Basophils Relative: 1 % (ref 0–1)
EOS ABS: 0.3 10*3/uL (ref 0.0–0.7)
Eosinophils Relative: 2 % (ref 0–5)
HCT: 36.1 % (ref 36.0–46.0)
HEMOGLOBIN: 12.2 g/dL (ref 12.0–15.0)
Lymphocytes Relative: 25 % (ref 12–46)
Lymphs Abs: 3.7 10*3/uL (ref 0.7–4.0)
MCH: 28 pg (ref 26.0–34.0)
MCHC: 33.8 g/dL (ref 30.0–36.0)
MCV: 83 fL (ref 78.0–100.0)
Monocytes Absolute: 0.7 10*3/uL (ref 0.1–1.0)
Monocytes Relative: 5 % (ref 3–12)
NEUTROS ABS: 9.8 10*3/uL — AB (ref 1.7–7.7)
NEUTROS PCT: 67 % (ref 43–77)
Platelets: 453 10*3/uL — ABNORMAL HIGH (ref 150–400)
RBC: 4.35 MIL/uL (ref 3.87–5.11)
RDW: 17.7 % — ABNORMAL HIGH (ref 11.5–15.5)
WBC: 14.6 10*3/uL — ABNORMAL HIGH (ref 4.0–10.5)

## 2013-04-15 LAB — TSH: TSH: 1.494 u[IU]/mL (ref 0.350–4.500)

## 2013-04-15 LAB — SEDIMENTATION RATE: SED RATE: 28 mm/h — AB (ref 0–22)

## 2013-04-15 LAB — VITAMIN B12: VITAMIN B 12: 413 pg/mL (ref 211–911)

## 2013-04-15 MED ORDER — BETAMETHASONE DIPROPIONATE 0.05 % EX CREA
TOPICAL_CREAM | Freq: Two times a day (BID) | CUTANEOUS | Status: DC
Start: 2013-04-15 — End: 2013-12-28

## 2013-04-15 NOTE — Progress Notes (Signed)
Subjective:    Patient ID: Laura Ellison, female    DOB: [birth year], 68 y.o.   MRN: [id number]  HPI Itching and swelling around ankles is about the same. She now feels itchey on her low back.  She is still having burning sensation in her thighs as well. Liver enzynmes are normal. Has one little pink scaling patch on the right upper thigh. Big toes are chronically numb.  Burning and itching get worse when raise her legs up to help with swelling. She is taking Allegra daily.  Has been losing some weight since her surgery. Appetite is down some.  No chest pain or shortness of breath. She has been using over-the-counter equate brand hydrocortisone and feels like it has been working better than the prescription triamcinolone that she was given. No other worsening or alleviating factors. She denies any trauma or injury. She does not understand why she is retaining fluid.   Review of Systems BP 155/79  Pulse 97  Wt 189 lb (85.73 kg)  SpO2 94%    Allergies  Allergen Reactions  . Amoxicillin-Pot Clavulanate Diarrhea    Severe abdominal cramps and diarrhea  . Crestor [Rosuvastatin Calcium] Other (See Comments)    GI symptoms.    . Fenofibrate Other (See Comments)    GI sxs.   . Lipitor [Atorvastatin Calcium] Other (See Comments)    GI upset/stomach cramping  . Other     Pt states that strong ABT's cause bloody diarrhea  . Pravachol   . Prednisone Other (See Comments)    Chest discomfort with burst, has to do taper.  Roanna Banning Hcl] Other (See Comments)    Cramping   . Zocor [Simvastatin - High Dose]     Past Medical History  Diagnosis Date  . Hypertension   . Hyperlipidemia   . thyroid problem   . Pinched nerve in neck   . Emphysema   . Family history of anesthesia complication     "son had a problem waking up"  . Dysrhythmia     Hx; of palpitations in the past  . Anxiety     Hx; of situational anxiety  . Seasonal allergies     Hx: of  . Pneumonia     Hx:of  a "touch' of PNA  . Heart murmur     Hx: of as a child  . GERD (gastroesophageal reflux disease)   . Headache(784.0)   . Arthritis   . Anemia     Hx: of as a teen  . IBS (irritable bowel syndrome)     Hx; of    Past Surgical History  Procedure Laterality Date  . Cholecystectomy, laparoscopic  07/13/2010  . Ablation      Hx: of  . Cholecystectomy    . Appendectomy    . Tonsillectomy    . Cyst excision      Hx: of several  . Cardiac catheterization    . Eye surgery      Hx: of cyst removed from left eye  . Fine needle aspiration      Hx: of right breast  . Anterior cervical decompression/discectomy fusion 4 levels N/A 12/25/2012    Procedure: ANTERIOR CERVICAL DECOMPRESSION/DISCECTOMY FUSION 4 LEVELS;  Surgeon: Sinclair Ship, MD;  Location: [facility];  Service: Orthopedics;  Laterality: N/A;  Anterior cervical decompression fusion, cervical 4-5, cervical 5-6, cervical 6-7, cervical 7-thoracic 1 with instrumentation, allograft    History   Social History  . Marital Status: Married  Spouse Name: [name]     Number of Children: 2  . Years of Education: HS   Occupational History  . Retired.     Social History Main Topics  . Smoking status: Current Every Day Smoker -- 2.50 packs/day for 50 years    Types: Cigarettes  . Smokeless tobacco: Never Used  . Alcohol Use: Yes     Comment: " occasional"  . Drug Use: No  . Sexual Activity: Not Currently   Other Topics Concern  . Not on file   Social History Narrative   10 caffeinated drinks daily. No regular exercise. She recently moved here from [place] she now lives with her husband, daughter, granddaughter, son and sometimes grandson.    Family History  Problem Relation Age of Onset  . Lymphoma Father   . Diabetes Maternal Grandmother   . Stroke Maternal Grandmother   . Aortic aneurysm Mother     died from  . Stroke Maternal Uncle   . Heart attack      Mothers family  . Aortic aneurysm Mother      Deceased    Outpatient Encounter Prescriptions as of 04/15/2013  Medication Sig  . albuterol (VENTOLIN HFA) 108 (90 BASE) MCG/ACT inhaler Inhale 2 puffs into the lungs every 6 (six) hours as needed for wheezing.  Marland Kitchen ALPRAZolam (XANAX) 0.25 MG tablet Take 0.25 mg by mouth at bedtime as needed for anxiety.  . Cholecalciferol (VITAMIN D3) 1000 UNITS CAPS Take 2 capsules by mouth daily.   . fexofenadine (ALLEGRA) 180 MG tablet Take 180 mg by mouth every morning.   . methimazole (TAPAZOLE) 10 MG tablet Take 10 mg by mouth every morning.   . metoprolol tartrate (LOPRESSOR) 25 MG tablet TAKE 1 TABLET BY MOUTH TWICE DAILY  . Multiple Vitamins-Minerals (CENTRUM SILVER ULTRA WOMENS PO) Take 1 tablet by mouth daily.   Marland Kitchen omeprazole (PRILOSEC) 40 MG capsule Take 40 mg by mouth every morning.   . vitamin B-12 (CYANOCOBALAMIN) 1000 MCG tablet Take 1,000 mcg by mouth every other day.  . [DISCONTINUED] hydrochlorothiazide (HYDRODIURIL) 12.5 MG tablet Take 2 tablets (25 mg total) by mouth daily.  . [DISCONTINUED] triamcinolone ointment (KENALOG) 0.5 % Apply 1 application topically 2 (two) times daily.  Marland Kitchen albuterol (PROVENTIL) (2.5 MG/3ML) 0.083% nebulizer solution Take 3 mLs (2.5 mg total) by nebulization every 4 (four) hours as needed for wheezing.  . betamethasone dipropionate (DIPROLENE) 0.05 % cream Apply topically 2 (two) times daily.  Marland Kitchen losartan (COZAAR) 100 MG tablet TAKE 1 TABLET BY MOUTH DAILY  . [DISCONTINUED] sulfamethoxazole-trimethoprim (BACTRIM DS) 800-160 MG per tablet Take 1 tablet by mouth 2 (two) times daily.           Objective:   Physical Exam  Constitutional: She is oriented to person, place, and time. She appears well-developed and well-nourished.  HENT:  Head: Normocephalic and atraumatic.  Musculoskeletal:  1+ edema of both ankles bilaterally. Swelling to mid tibia bilaterally.  Neurological: She is alert and oriented to person, place, and time.  Skin: Skin is warm and dry.   She did have an approximately 1 cm oval pink scaling lesion on the right anterior thigh. No rash elsewhere. She does have some bruising and excoriations from scratching.  Psychiatric: She has a normal mood and affect. Her behavior is normal.          Assessment & Plan:  Lower extremity edema- she has 1+ edema on the ankles bilaterally. Still unclear cost. Liver and renal function  were normal on last labs. Urinalysis was a CK as well. Certainly she is somewhat sedentary after her back surgery which could be contributing. Continue to watch salt intake. She did not tolerate hydrochlorothiazide. We could consider something more potent of her blood work is okay today.  Itching - liver enzymes were normal.  Unclear etiology. It could just be the stretching of the skin from the swelling causing irritation itching. She also does have some dry skin. Recommended switch to Cetaphil body wash and moisturizer which is perfume free dye free for eczema. She felt like the over-the-counter equate brand of hydrocortisone work better than the prescription triamcinolone. Thus we'll try a stronger steroid, clobetasol cream. Let me know if not helping. The itching is primarily over the lower abdomen thighs and lower legs. Absent on the feet and knees and upper torso and arms. Also recommend avoiding any other lotions, powders, perfumes etc. that could be irritating the skin. I did do a KOH skin scraping of this singular lesion that she had on the right upper anterior thigh. There was no other rash or lesions.  Elevated WBC - Will repeat today.  She has a chronically elevated white blood count but 15,000 was a little high for her at the last office visit. We'll repeat today to make sure coming back down to her normal which is around 13,000. Or if climbing the may need to consider other causes for infection.

## 2013-04-16 ENCOUNTER — Other Ambulatory Visit: Payer: Self-pay | Admitting: Family Medicine

## 2013-04-16 LAB — ANA: Anti Nuclear Antibody(ANA): NEGATIVE

## 2013-04-16 LAB — KOH PREP: RESULT - KOH: NONE SEEN

## 2013-04-16 LAB — VITAMIN D 25 HYDROXY (VIT D DEFICIENCY, FRACTURES): Vit D, 25-Hydroxy: 65 ng/mL (ref 30–89)

## 2013-04-17 ENCOUNTER — Other Ambulatory Visit: Payer: Self-pay | Admitting: Family Medicine

## 2013-04-17 DIAGNOSIS — L282 Other prurigo: Secondary | ICD-10-CM

## 2013-04-21 ENCOUNTER — Telehealth: Payer: Self-pay | Admitting: *Deleted

## 2013-04-21 NOTE — Telephone Encounter (Signed)
Called and left address for Dr. Altamese Cabal w/pt's husband:  [address] [place], [place] [address] [phone]  .Marland KitchenAudelia Hives Byars

## 2013-05-02 ENCOUNTER — Other Ambulatory Visit: Payer: Self-pay | Admitting: Family Medicine

## 2013-05-04 ENCOUNTER — Ambulatory Visit (INDEPENDENT_AMBULATORY_CARE_PROVIDER_SITE_OTHER): Payer: Medicare HMO

## 2013-05-04 DIAGNOSIS — M6281 Muscle weakness (generalized): Secondary | ICD-10-CM

## 2013-05-04 DIAGNOSIS — M542 Cervicalgia: Secondary | ICD-10-CM

## 2013-05-04 DIAGNOSIS — R293 Abnormal posture: Secondary | ICD-10-CM

## 2013-05-11 ENCOUNTER — Encounter (INDEPENDENT_AMBULATORY_CARE_PROVIDER_SITE_OTHER): Payer: Medicare HMO | Admitting: Physical Therapy

## 2013-05-11 DIAGNOSIS — M545 Low back pain, unspecified: Secondary | ICD-10-CM

## 2013-05-11 DIAGNOSIS — R269 Unspecified abnormalities of gait and mobility: Secondary | ICD-10-CM

## 2013-05-11 DIAGNOSIS — M542 Cervicalgia: Secondary | ICD-10-CM

## 2013-05-11 DIAGNOSIS — M6281 Muscle weakness (generalized): Secondary | ICD-10-CM

## 2013-05-13 ENCOUNTER — Encounter (INDEPENDENT_AMBULATORY_CARE_PROVIDER_SITE_OTHER): Payer: Medicare HMO | Admitting: Physical Therapy

## 2013-05-13 DIAGNOSIS — M545 Low back pain, unspecified: Secondary | ICD-10-CM

## 2013-05-13 DIAGNOSIS — M542 Cervicalgia: Secondary | ICD-10-CM

## 2013-05-20 ENCOUNTER — Encounter: Payer: Medicare HMO | Admitting: Physical Therapy

## 2013-05-29 ENCOUNTER — Other Ambulatory Visit: Payer: Self-pay | Admitting: Family Medicine

## 2013-06-08 ENCOUNTER — Encounter: Payer: Self-pay | Admitting: Internal Medicine

## 2013-06-08 ENCOUNTER — Ambulatory Visit (INDEPENDENT_AMBULATORY_CARE_PROVIDER_SITE_OTHER): Payer: Medicare HMO | Admitting: Internal Medicine

## 2013-06-08 VITALS — BP 124/74 | HR 79 | Temp 98.0°F | Resp 12 | Ht 65.25 in | Wt 187.0 lb

## 2013-06-08 DIAGNOSIS — E0500 Thyrotoxicosis with diffuse goiter without thyrotoxic crisis or storm: Secondary | ICD-10-CM

## 2013-06-08 MED ORDER — METHIMAZOLE 10 MG PO TABS: 5.0000 mg | ORAL_TABLET | Freq: Every day | ORAL | Status: DC

## 2013-06-08 NOTE — Patient Instructions (Signed)
Please come to the lab in 5-6 weeks. Please decrease the methimazole to 5 mg daily.  Please stop the Methimazole (Tapazole) and call us or your primary care doctor if you develop: - sore throat - fever - yellow skin - dark urine - light colored stools As we will then need to check your blood counts and liver tests.  Please return for another visit in 3 months.

## 2013-06-08 NOTE — Progress Notes (Addendum)
Patient ID: Laura Ellison, female   DOB: 10-10-1945, 68 y.o.   MRN: [id number]   HPI  Laura Ellison is a 68 y.o.-year-old female, referred by her PCP, Dr. Madilyn Fireman, for evaluation for Graves ds.  She was dx with hyperthyroidism as a teenager. She was on medication on and off. She restarted to have hyperthyroidism as she started menopause 10 years ago in [place] (Graves ds.). She is on MMI 10 mg in am - for a long time.   She has MNG and had Bx of 2 nodules in 2013 >> benign.  I reviewed pt's thyroid tests: Lab Results  Component Value Date   TSH 1.494 04/15/2013    Pt denies feeling nodules in neck, hoarseness, dysphagia/odynophagia, SOB with lying down; she c/o: - + fatigue - no excessive sweating/heat intolerance unless taking a hot shower or staying in the heat outside - occasionally tremors - no anxiety - no palpitations (had an ablation "years ago") - no hyperdefecation - no weight loss - + poor sleep  Pt does not have a FH of thyroid ds. No FH of thyroid cancer. No h/o radiation tx to head or neck.  No seaweed or kelp, no recent contrast studies. No steroid use. No herbal supplements.   ROS: Constitutional: no weight gain/loss, no fatigue, no subjective hyperthermia/hypothermia, + nocturia Eyes: no blurry vision, no xerophthalmia ENT: no sore throat, no nodules palpated in throat, no dysphagia/odynophagia, no hoarseness Cardiovascular: no CP/SOB/palpitations/leg swelling Respiratory: _+ cough/no SOB, + wheezing Gastrointestinal: no N/V/D/C, + heartburn Musculoskeletal: no muscle/joint aches Skin: no rashes, + hair loss Neurological: no tremors/numbness/tingling/dizziness, + HA Psychiatric: no depression/anxiety + low libido  Past Medical History  Diagnosis Date  . Hypertension   . Hyperlipidemia   . thyroid problem   . Pinched nerve in neck   . Emphysema   . Family history of anesthesia complication     "son had a problem waking up"  . Dysrhythmia     Hx; of  palpitations in the past  . Anxiety     Hx; of situational anxiety  . Seasonal allergies     Hx: of  . Pneumonia     Hx:of a "touch' of PNA  . Heart murmur     Hx: of as a child  . GERD (gastroesophageal reflux disease)   . Headache(784.0)   . Arthritis   . Anemia     Hx: of as a teen  . IBS (irritable bowel syndrome)     Hx; of   Past Surgical History  Procedure Laterality Date  . Cholecystectomy, laparoscopic  07/13/2010  . Ablation      Hx: of  . Cholecystectomy    . Appendectomy    . Tonsillectomy    . Cyst excision      Hx: of several  . Cardiac catheterization    . Eye surgery      Hx: of cyst removed from left eye  . Fine needle aspiration      Hx: of right breast  . Anterior cervical decompression/discectomy fusion 4 levels N/A 12/25/2012    Procedure: ANTERIOR CERVICAL DECOMPRESSION/DISCECTOMY FUSION 4 LEVELS;  Surgeon: Sinclair Ship, MD;  Location: [facility];  Service: Orthopedics;  Laterality: N/A;  Anterior cervical decompression fusion, cervical 4-5, cervical 5-6, cervical 6-7, cervical 7-thoracic 1 with instrumentation, allograft   History   Social History  . Marital Status: Married    Spouse Name: Laura Ellison     Number of Children: 2  .  Years of Education: HS   Occupational History  . Retired.     Social History Main Topics  . Smoking status: Current Every Day Smoker -- 2.50 packs/day for 50 years    Types: Cigarettes  . Smokeless tobacco: Never Used  . Alcohol Use: Yes     Comment: " occasional"  . Drug Use: No   Social History Narrative   10 caffeinated drinks daily. No regular exercise. She recently moved here from [place] she now lives with her husband, daughter, granddaughter, son and sometimes grandson.   Current Outpatient Prescriptions on File Prior to Visit  Medication Sig Dispense Refill  . ALPRAZolam (XANAX) 0.25 MG tablet Take 0.25 mg by mouth at bedtime as needed for anxiety.      . Cholecalciferol (VITAMIN D3) 1000 UNITS  CAPS Take 2 capsules by mouth daily.       Marland Kitchen losartan (COZAAR) 100 MG tablet TAKE 1 TABLET BY MOUTH DAILY  90 tablet  0  . losartan (COZAAR) 100 MG tablet TAKE 1 TABLET BY MOUTH DAILY AS DIRECTED  30 tablet  0  . methimazole (TAPAZOLE) 10 MG tablet Take 10 mg by mouth every morning.       . metoprolol tartrate (LOPRESSOR) 25 MG tablet TAKE 1 TABLET BY MOUTH TWICE DAILY AS DIRECTED  180 tablet  0  . Multiple Vitamins-Minerals (CENTRUM SILVER ULTRA WOMENS PO) Take 1 tablet by mouth daily.       Marland Kitchen omeprazole (PRILOSEC) 40 MG capsule Take 40 mg by mouth every morning.       Marland Kitchen omeprazole (PRILOSEC) 40 MG capsule TAKE ONE CAPSULE BY MOUTH DAILY  90 capsule  0  . vitamin B-12 (CYANOCOBALAMIN) 1000 MCG tablet Take 1,000 mcg by mouth every other day.      . albuterol (PROVENTIL) (2.5 MG/3ML) 0.083% nebulizer solution Take 3 mLs (2.5 mg total) by nebulization every 4 (four) hours as needed for wheezing.  75 mL  12  . albuterol (VENTOLIN HFA) 108 (90 BASE) MCG/ACT inhaler Inhale 2 puffs into the lungs every 6 (six) hours as needed for wheezing.  1 Inhaler  2  . betamethasone dipropionate (DIPROLENE) 0.05 % cream Apply topically 2 (two) times daily.  45 g  0  . fexofenadine (ALLEGRA) 180 MG tablet Take 180 mg by mouth every morning.        No current facility-administered medications on file prior to visit.   Allergies  Allergen Reactions  . Amoxicillin-Pot Clavulanate Diarrhea    Severe abdominal cramps and diarrhea  . Crestor [Rosuvastatin Calcium] Other (See Comments)    GI symptoms.    . Fenofibrate Other (See Comments)    GI sxs.   . Lipitor [Atorvastatin Calcium] Other (See Comments)    GI upset/stomach cramping  . Other     Pt states that strong ABT's cause bloody diarrhea  . Pravachol   . Prednisone Other (See Comments)    Chest discomfort with burst, has to do taper.  Roanna Banning Hcl] Other (See Comments)    Cramping   . Zocor [Simvastatin - High Dose]    Family History   Problem Relation Age of Onset  . Lymphoma Father   . Diabetes Maternal Grandmother   . Stroke Maternal Grandmother   . Aortic aneurysm Mother     died from  . Stroke Maternal Uncle   . Heart attack      Mothers family  . Aortic aneurysm Mother     Deceased  PE: BP 124/74  Pulse 79  Temp(Src) 98 F (36.7 C) (Oral)  Resp 12  Ht 5' 5.25" (1.657 m)  Wt 187 lb (84.823 kg)  BMI 30.89 kg/m2  SpO2 97% Wt Readings from Last 3 Encounters:  06/08/13 187 lb (84.823 kg)  04/15/13 189 lb (85.73 kg)  03/13/13 193 lb (87.544 kg)   Constitutional: overweight, in NAD Eyes: PERRLA, EOMI, no exophthalmos, no lid lag, no stare ENT: moist mucous membranes, no thyromegaly, no thyroid bruits, no cervical lymphadenopathy Cardiovascular: RRR, No MRG Respiratory: CTA B Gastrointestinal: abdomen soft, NT, ND, BS+ Musculoskeletal: no deformities, strength intact in all 4 Skin: moist, warm, no rashes Neurological: + tremor with outstretched L hand - after her cervical sx., DTR normal in all 4  ASSESSMENT: 1. Graves ds.  PLAN:  1. Patient with a h/o long standing Graves disease without current sxs except irritability, on MMI. Last TSH was normal, on 10 mg MMI daily  - since her Graves ds is long standing, I suggested to try to decrease MMI and see if TSH decreases >> in that case, we need an Uptake and scan and then RAI tx for definitive tx. - we will check the TSH, fT3 and fT4 in 5 weeks >> if TSH low, will stop MMI for 4 days and then check Uptake and scan. If TSH normal, will stop MMI and recheck labs in 5-6 weeks. - I advised her to join my chart to communicate easier, but she refuses Return in about 3 months (around 09/08/2013).  07/02/2013 Component     Latest Ref Rng 07/01/2013  Free T4     0.60 - 1.60 ng/dL 0.75  T3, Free     2.3 - 4.2 pg/mL 2.7  TSH     0.35 - 4.50 uIU/mL 0.31 (L)  Per the plan above >> will get an Uptake and scan. Also, will increase the MMI back to 10 mg daily and  will stop it 4 days prior to the Uptake and scan - resume afterwards.

## 2013-07-01 ENCOUNTER — Other Ambulatory Visit (INDEPENDENT_AMBULATORY_CARE_PROVIDER_SITE_OTHER): Payer: Medicare HMO

## 2013-07-01 DIAGNOSIS — E0500 Thyrotoxicosis with diffuse goiter without thyrotoxic crisis or storm: Secondary | ICD-10-CM

## 2013-07-01 LAB — T3, FREE: T3, Free: 2.7 pg/mL (ref 2.3–4.2)

## 2013-07-01 LAB — TSH: TSH: 0.31 u[IU]/mL — ABNORMAL LOW (ref 0.35–4.50)

## 2013-07-01 LAB — T4, FREE: FREE T4: 0.75 ng/dL (ref 0.60–1.60)

## 2013-07-02 ENCOUNTER — Telehealth: Payer: Self-pay | Admitting: Internal Medicine

## 2013-07-02 NOTE — Telephone Encounter (Signed)
Please call pt with lab results asap °

## 2013-07-02 NOTE — Telephone Encounter (Signed)
Please read note below and advise. Thank you.  

## 2013-07-02 NOTE — Addendum Note (Signed)
Addended by: Philemon Kingdom on: 07/02/2013 11:39 AM   Modules accepted: Orders

## 2013-07-02 NOTE — Telephone Encounter (Signed)
I just sent you a results note for her.

## 2013-07-07 ENCOUNTER — Telehealth: Payer: Self-pay | Admitting: *Deleted

## 2013-07-07 NOTE — Telephone Encounter (Signed)
Called and scheduled pt's appt for her uptake and scan: June 16th and 17th at 10:00 am (arrive at 9:45 am). Called pt and advised of the appt date and time. Pt understood.

## 2013-07-13 ENCOUNTER — Other Ambulatory Visit: Payer: Self-pay | Admitting: Family Medicine

## 2013-07-21 ENCOUNTER — Telehealth: Payer: Self-pay | Admitting: Internal Medicine

## 2013-07-21 ENCOUNTER — Encounter (HOSPITAL_COMMUNITY)
Admission: RE | Admit: 2013-07-21 | Discharge: 2013-07-21 | Disposition: A | Discharge status: Home / Self Care | Payer: Medicare HMO | Source: Ambulatory Visit | Attending: Internal Medicine | Admitting: Internal Medicine

## 2013-07-21 DIAGNOSIS — E0500 Thyrotoxicosis with diffuse goiter without thyrotoxic crisis or storm: Secondary | ICD-10-CM | POA: Insufficient documentation

## 2013-07-21 NOTE — Telephone Encounter (Signed)
Returned pt's call and advised her that she could take her methimazole after her appt tomorrow.

## 2013-07-21 NOTE — Telephone Encounter (Signed)
Patient would like to know if she can take her methmizole   Please advise   Thank You   *ok to leave detailed message

## 2013-07-22 ENCOUNTER — Encounter (HOSPITAL_COMMUNITY)
Admission: RE | Admit: 2013-07-22 | Discharge: 2013-07-22 | Disposition: A | Discharge status: Home / Self Care | Payer: Medicare HMO | Source: Ambulatory Visit | Attending: Internal Medicine | Admitting: Internal Medicine

## 2013-07-22 ENCOUNTER — Encounter (HOSPITAL_COMMUNITY): Payer: Self-pay

## 2013-07-22 MED ORDER — SODIUM PERTECHNETATE TC 99M INJECTION
10.9000 | Freq: Once | INTRAVENOUS | Status: AC | PRN
  Administered 2013-07-22: 10.9 via INTRAVENOUS

## 2013-07-22 MED ORDER — SODIUM IODIDE I 131 CAPSULE
8.2300 | Freq: Once | INTRAVENOUS | Status: AC | PRN
  Administered 2013-07-21: 8.23 via ORAL

## 2013-07-31 ENCOUNTER — Other Ambulatory Visit: Payer: Self-pay | Admitting: Family Medicine

## 2013-08-17 ENCOUNTER — Ambulatory Visit (INDEPENDENT_AMBULATORY_CARE_PROVIDER_SITE_OTHER): Payer: Medicare HMO | Admitting: Family Medicine

## 2013-08-17 ENCOUNTER — Encounter: Payer: Self-pay | Admitting: Family Medicine

## 2013-08-17 VITALS — BP 135/64 | HR 75 | Wt 188.0 lb

## 2013-08-17 DIAGNOSIS — N959 Unspecified menopausal and perimenopausal disorder: Secondary | ICD-10-CM

## 2013-08-17 DIAGNOSIS — N390 Urinary tract infection, site not specified: Secondary | ICD-10-CM

## 2013-08-17 DIAGNOSIS — I10 Essential (primary) hypertension: Secondary | ICD-10-CM

## 2013-08-17 DIAGNOSIS — Z23 Encounter for immunization: Secondary | ICD-10-CM

## 2013-08-17 DIAGNOSIS — R300 Dysuria: Secondary | ICD-10-CM

## 2013-08-17 DIAGNOSIS — J411 Mucopurulent chronic bronchitis: Secondary | ICD-10-CM

## 2013-08-17 DIAGNOSIS — Z1231 Encounter for screening mammogram for malignant neoplasm of breast: Secondary | ICD-10-CM

## 2013-08-17 DIAGNOSIS — E782 Mixed hyperlipidemia: Secondary | ICD-10-CM

## 2013-08-17 LAB — POCT URINALYSIS DIPSTICK
Bilirubin, UA: NEGATIVE
Glucose, UA: NEGATIVE
KETONES UA: NEGATIVE
NITRITE UA: NEGATIVE
PROTEIN UA: 30
Spec Grav, UA: 1.02
Urobilinogen, UA: 0.2
pH, UA: 5.5

## 2013-08-17 MED ORDER — LOSARTAN POTASSIUM 100 MG PO TABS: ORAL_TABLET | ORAL | Status: DC

## 2013-08-17 MED ORDER — METOPROLOL TARTRATE 25 MG PO TABS: ORAL_TABLET | ORAL | Status: DC

## 2013-08-17 MED ORDER — FLUTICASONE PROPIONATE 50 MCG/ACT NA SUSP: 2.0000 | Freq: Every day | NASAL | Status: DC

## 2013-08-17 MED ORDER — OMEPRAZOLE 40 MG PO CPDR: DELAYED_RELEASE_CAPSULE | ORAL | Status: DC

## 2013-08-17 MED ORDER — CIPROFLOXACIN HCL 500 MG PO TABS: 500.0000 mg | ORAL_TABLET | Freq: Two times a day (BID) | ORAL | Status: AC

## 2013-08-17 NOTE — Progress Notes (Signed)
   Subjective:    Patient ID: Laura Ellison, female    DOB: 02/03/1946, 68 y.o.   MRN: [id number]  HPI Hypertension- Pt denies chest pain, SOB, dizziness, or heart palpitations.  Taking meds as directed w/o problems.  Denies medication side effects.    Dysuria x3 days. No hematuria. No fevers chills or sweats. No worsening or alleviating factors. No low back pain changes. She has a history of chronic low back pain.  COPD- still a heavy smoker at about 2 and half packs per day. No recent flares or exacerbations.  History of hyperlipidemia not currently taking any cholesterol lowering drugs. Due to repeat labs. H  Has a persistant post nasal drip. She is on claritin daily.  She fell at the [place] worked better for her productive cough, chest congestion and nasal drip but the Claritin works better for her hives. He'll get severe cough and phlegm production especially in the mornings she also continues to be a heavy smoker daily.   Review of Systems     Objective:   Physical Exam  Constitutional: She is oriented to person, place, and time. She appears well-developed and well-nourished.  HENT:  Head: Normocephalic and atraumatic.  Right Ear: External ear normal.  Left Ear: External ear normal.  Nose: Nose normal.  Mouth/Throat: Oropharynx is clear and moist.  TMs and canals are clear.   Eyes: Conjunctivae and EOM are normal. Pupils are equal, round, and reactive to light.  Neck: Neck supple. No thyromegaly present.  Cardiovascular: Normal rate, regular rhythm and normal heart sounds.   Pulmonary/Chest: Effort normal and breath sounds normal. She has no wheezes.  Lymphadenopathy:    She has no cervical adenopathy.  Neurological: She is alert and oriented to person, place, and time.  Skin: Skin is warm and dry.  Psychiatric: She has a normal mood and affect.          Assessment & Plan:  Hypertension-well-controlled on current regimen. Continue losartan and  metoprolol.  UTI-urinalysis is positive for protein, leukocytes and blood. We will treat with ciprofloxacin. Call if not significantly better in one week.  Due for screening mammogram and DEXA.Marland Kitchen  COPD-due for Prevnar vaccine. Given Prevnar 13 today. She think she had the pneumococcal 2 around age 32 with her previous provider before moving to the local area. She says she will try to get me the name information to this doctor so that we can call for a vaccine record.  Productive cough/nasal drip-will try adding a nasal steroid spray to her regimen. If productive cough in the mornings continues then consider a course of antibiotic therapy. Also recommend smoking cessation.  Hyperlipidemia-given lab slip to recheck lipids today.

## 2013-08-17 NOTE — Patient Instructions (Signed)
We have placed an order for your mammogram and bone density test. This can be scheduled on the same day downstairs. They should hopefully give you a call next day or 2. If you do not hear from them then please let me know. If you remember the name of the doctor that might have given you the pneumonia vaccine then please let me know and we will try to get this for our records and entered into your chart.

## 2013-08-17 NOTE — Addendum Note (Signed)
Addended by: Teddy Spike on: 08/17/2013 01:48 PM   Modules accepted: Orders

## 2013-08-31 ENCOUNTER — Ambulatory Visit (INDEPENDENT_AMBULATORY_CARE_PROVIDER_SITE_OTHER): Payer: Medicare HMO | Admitting: Family Medicine

## 2013-08-31 ENCOUNTER — Encounter: Payer: Self-pay | Admitting: Family Medicine

## 2013-08-31 VITALS — BP 150/74 | HR 81 | Temp 98.1°F | Ht 65.0 in | Wt 191.0 lb

## 2013-08-31 DIAGNOSIS — N390 Urinary tract infection, site not specified: Secondary | ICD-10-CM

## 2013-08-31 DIAGNOSIS — J0100 Acute maxillary sinusitis, unspecified: Secondary | ICD-10-CM

## 2013-08-31 MED ORDER — SULFAMETHOXAZOLE-TMP DS 800-160 MG PO TABS: 1.0000 | ORAL_TABLET | Freq: Two times a day (BID) | ORAL | Status: DC

## 2013-08-31 NOTE — Patient Instructions (Signed)

## 2013-08-31 NOTE — Progress Notes (Signed)
   Subjective:    Patient ID: Laura Ellison, female    DOB: 01/29/1946, 68 y.o.   MRN: [id number]  HPI Sinus pain and pressure and now have right ear pain and pressure x 5 days. No fever, chills or sweats. Using OTC Delsym.  No other meds.  Lots of pain under the right eye as well. Some post nasal drip and cough.  No productive sputum.     Review of Systems     Objective:   Physical Exam  Constitutional: She is oriented to person, place, and time. She appears well-developed and well-nourished.  HENT:  Head: Normocephalic and atraumatic.  Right Ear: External ear normal.  Left Ear: External ear normal.  Nose: Nose normal.  Mouth/Throat: Oropharynx is clear and moist.  TMs and canals are clear.   Eyes: Conjunctivae and EOM are normal. Pupils are equal, round, and reactive to light.  Neck: Neck supple. No thyromegaly present.  Cardiovascular: Normal rate, regular rhythm and normal heart sounds.   Pulmonary/Chest: Effort normal and breath sounds normal. She has no wheezes.  Lymphadenopathy:    She has no cervical adenopathy.  Neurological: She is alert and oriented to person, place, and time.  Skin: Skin is warm and dry.  Psychiatric: She has a normal mood and affect. Her behavior is normal.          Assessment & Plan:  Acute right maxillary sinusitis - will treat with Bactrim. Call if not significantly better in one week. Continue symptomatic care as needed. Handout provided.  I did update her endocrinology information.

## 2013-09-04 ENCOUNTER — Other Ambulatory Visit: Payer: Self-pay | Admitting: Family Medicine

## 2013-09-08 ENCOUNTER — Encounter: Payer: Self-pay | Admitting: Internal Medicine

## 2013-09-08 ENCOUNTER — Ambulatory Visit (INDEPENDENT_AMBULATORY_CARE_PROVIDER_SITE_OTHER): Payer: Medicare HMO | Admitting: Internal Medicine

## 2013-09-08 VITALS — BP 126/72 | HR 83 | Temp 98.2°F | Ht 65.0 in | Wt 187.0 lb

## 2013-09-08 DIAGNOSIS — E010 Iodine-deficiency related diffuse (endemic) goiter: Secondary | ICD-10-CM

## 2013-09-08 DIAGNOSIS — E049 Nontoxic goiter, unspecified: Secondary | ICD-10-CM

## 2013-09-08 DIAGNOSIS — E0500 Thyrotoxicosis with diffuse goiter without thyrotoxic crisis or storm: Secondary | ICD-10-CM

## 2013-09-08 LAB — T4, FREE: Free T4: 0.71 ng/dL (ref 0.60–1.60)

## 2013-09-08 LAB — TSH: TSH: 0.77 u[IU]/mL (ref 0.35–4.50)

## 2013-09-08 LAB — T3, FREE: T3, Free: 2.5 pg/mL (ref 2.3–4.2)

## 2013-09-08 MED ORDER — METHIMAZOLE 10 MG PO TABS: 10.0000 mg | ORAL_TABLET | Freq: Every day | ORAL | Status: DC

## 2013-09-08 NOTE — Patient Instructions (Signed)
Please stop at the lab. Please return in 6 months, but we will likely need labs before then.

## 2013-09-08 NOTE — Progress Notes (Signed)
Patient ID: Laura Ellison, female   DOB: 1945/09/01, 68 y.o.   MRN: [id number]   HPI  SHIVALI Ellison is a 68 y.o.-year-old female, returning for f/u for Graves ds. Last visit 3 mo ago.  Reviewed hx: She was dx with hyperthyroidism as a teenager. She was on medication on and off. She restarted to have hyperthyroidism as she started menopause 10 years ago in [place] (Graves ds.). She is on MMI 10 mg in am - for a long time.   She has MNG and had Bx of 2 nodules in 2013 >> benign.  Pt had a thyroid uptake and scan (07/22/2013): There is uniform uptake within thyroid gland. No nodularity. 24 hour I 131 uptake = 22.7% (normal 10-30%)  We increased her MMI to 10 mg daily after last set of TFTs showed a suppressed TSH on MMI 5 mg daily. She feels well on this dose.  Component     Latest Ref Rng 07/01/2013  Free T4     0.60 - 1.60 ng/dL 0.75  T3, Free     2.3 - 4.2 pg/mL 2.7  TSH     0.35 - 4.50 uIU/mL 0.31 (L)   Pt denies feeling nodules in neck, hoarseness, dysphagia/odynophagia, SOB with lying down; she c/o: - + fatigue, naps in the pm - + tremors - not new - from her previous neck surgery - no excessive sweating/heat intolerance  - occasionally tremors - no anxiety - no palpitations (had an ablation "years ago") - no hyperdefecation - no weight loss - occasionally poor sleep - + leg cramps - takes Magnesium  I reviewed pt's medications, allergies, PMH, social hx, family hx and no changes required, except as mentioned above. She changed her Allegra to Claritin per dermatologist's recs. She also had a UTI that was tx'ed with Abx.   ROS: Constitutional: no weight gain/loss, no fatigue, no subjective hyperthermia/hypothermia, + nocturia Eyes: no blurry vision, no xerophthalmia ENT: no sore throat, no nodules palpated in throat, no dysphagia/odynophagia, no hoarseness Cardiovascular: no CP/SOB/palpitations/leg swelling Respiratory: + cough/+ SOB/ + wheezing - she has allergies, also  has emphysema Gastrointestinal: no N/V/D/C/heartburn Musculoskeletal: no muscle/joint aches Skin: + rash, + hair loss Neurological: no tremors/numbness/tingling/dizziness, + HA Psychiatric: no depression/anxiety + low libido  PE: BP 126/72  Pulse 83  Temp(Src) 98.2 F (36.8 C) (Oral)  Ht 5\' 5"  (1.651 m)  Wt 187 lb (84.823 kg)  BMI 31.12 kg/m2  SpO2 92% Wt Readings from Last 3 Encounters:  09/08/13 187 lb (84.823 kg)  08/31/13 191 lb (86.637 kg)  08/17/13 188 lb (85.276 kg)   Constitutional: overweight, in NAD Eyes: PERRLA, EOMI, no exophthalmos, no lid lag, no stare ENT: moist mucous membranes, no thyromegaly, no thyroid bruits, no cervical lymphadenopathy Cardiovascular: RRR, No MRG Respiratory: CTA B Gastrointestinal: abdomen soft, NT, ND, BS+ Musculoskeletal: no deformities, strength intact in all 4 Skin: moist, warm, no rashes Neurological: + tremor with outstretched L hand - after her cervical sx., DTR normal in all 4  ASSESSMENT: 1. Graves ds.  2. MNG  PLAN:  1. Patient with a h/o long standing Graves disease without current sxs except irritability (resolved after sh increased the MMI dose back to 10 mg daily). - she has long-standing Graves and a reassuring Uptake and scan (we reviewed this today) and she would like to continue MMI rather than having RAI tx - we will check the TSH, fT3 and fT4 today >> if TSH normal, will continue MMI 10 mg  daily and recheck labs in 6 weeks. If the TSH is high, we will need to decrease the MMI dose although she is reticent to retry to reduce it due to the irritability that she experienced before when she tried to reduce the dose Return in about 6 months (around 03/11/2014). She needs a refill of the MMI (she prefers 10 mg tabs) when labs are back - [facility]  2. Pt has a h/o MNG, but she had normal Bx's in 2013 - no neck compression sxs - we may need a thyroid U/S in the future, but only if she developed neck  compression sxs or a visible/palpable nodule  Office Visit on 09/08/2013  Component Date Value Ref Range Status  . T3, Free 09/08/2013 2.5  2.3 - 4.2 pg/mL Final  . TSH 09/08/2013 0.77  0.35 - 4.50 uIU/mL Final  . Free T4 09/08/2013 0.71  0.60 - 1.60 ng/dL Final   normal TFTs >> stay on the same dose of MMI 10 mg daily. RTC in 3 mo for repeat labs.

## 2013-09-15 ENCOUNTER — Ambulatory Visit (INDEPENDENT_AMBULATORY_CARE_PROVIDER_SITE_OTHER): Payer: Medicare HMO

## 2013-09-15 DIAGNOSIS — N959 Unspecified menopausal and perimenopausal disorder: Secondary | ICD-10-CM

## 2013-09-15 DIAGNOSIS — Z1231 Encounter for screening mammogram for malignant neoplasm of breast: Secondary | ICD-10-CM

## 2013-09-15 DIAGNOSIS — Z13820 Encounter for screening for osteoporosis: Secondary | ICD-10-CM

## 2013-09-15 DIAGNOSIS — R928 Other abnormal and inconclusive findings on diagnostic imaging of breast: Secondary | ICD-10-CM

## 2013-09-17 ENCOUNTER — Other Ambulatory Visit: Payer: Self-pay | Admitting: Family Medicine

## 2013-09-17 DIAGNOSIS — R928 Other abnormal and inconclusive findings on diagnostic imaging of breast: Secondary | ICD-10-CM

## 2013-09-23 ENCOUNTER — Ambulatory Visit
Admission: RE | Admit: 2013-09-23 | Discharge: 2013-09-23 | Disposition: A | Discharge status: Home / Self Care | Payer: Medicare HMO | Source: Ambulatory Visit | Attending: Family Medicine | Admitting: Family Medicine

## 2013-09-23 DIAGNOSIS — R928 Other abnormal and inconclusive findings on diagnostic imaging of breast: Secondary | ICD-10-CM

## 2013-10-14 ENCOUNTER — Other Ambulatory Visit: Payer: Self-pay | Admitting: Family Medicine

## 2013-11-16 ENCOUNTER — Encounter: Payer: Self-pay | Admitting: Family Medicine

## 2013-11-16 ENCOUNTER — Ambulatory Visit: Payer: Medicare HMO | Admitting: Family Medicine

## 2013-11-16 ENCOUNTER — Ambulatory Visit (INDEPENDENT_AMBULATORY_CARE_PROVIDER_SITE_OTHER): Payer: Medicare HMO | Admitting: Family Medicine

## 2013-11-16 VITALS — BP 151/72 | HR 77 | Temp 97.9°F | Wt 194.0 lb

## 2013-11-16 DIAGNOSIS — G43111 Migraine with aura, intractable, with status migrainosus: Secondary | ICD-10-CM

## 2013-11-16 MED ORDER — KETOROLAC TROMETHAMINE 60 MG/2ML IJ SOLN
60.0000 mg | Freq: Once | INTRAMUSCULAR | Status: AC
Start: 2013-11-16 — End: 2013-11-16
  Administered 2013-11-16: 60 mg via INTRAMUSCULAR

## 2013-11-16 MED ORDER — DIAZEPAM 5 MG PO TABS: 5.0000 mg | ORAL_TABLET | Freq: Two times a day (BID) | ORAL | Status: DC | PRN

## 2013-11-16 MED ORDER — NORTRIPTYLINE HCL 25 MG PO CAPS: ORAL_CAPSULE | ORAL | Status: DC

## 2013-11-16 MED ORDER — PROMETHAZINE HCL 25 MG/ML IJ SOLN
25.0000 mg | Freq: Once | INTRAMUSCULAR | Status: AC
  Administered 2013-11-16: 25 mg via INTRAMUSCULAR

## 2013-11-16 NOTE — Addendum Note (Signed)
Addended by: Teddy Spike on: 11/16/2013 06:35 PM   Modules accepted: Orders

## 2013-11-16 NOTE — Progress Notes (Signed)
   Subjective:    Patient ID: Laura Ellison, female    DOB: 1945/09/02, 68 y.o.   MRN: [id number]  Headache    About 4-5 days of her migraine. HA is 6/10.  She feels nauseated.  She feels lightheaded. Uses tylenol arthritis.  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  Using biofreeze on the muscles as well. She says her migraines really start flaring in the fall. Usually from august to November. She thinks may be allergy triggered from mold.  She has used valium in the past a a muscle relaxer and was helpful. She has never been on anything prophylactically for her migraines.    Review of Systems  Neurological: Positive for headaches.       Objective:   Physical Exam  Constitutional: She is oriented to person, place, and time. She appears well-developed and well-nourished.  HENT:  Head: Normocephalic and atraumatic.  Right Ear: External ear normal.  Left Ear: External ear normal.  Nose: Nose normal.  Mouth/Throat: Oropharynx is clear and moist.  TMs and canals are clear.   Eyes: Conjunctivae are normal. Pupils are equal, round, and reactive to light.  Neck: Neck supple. No thyromegaly present.  Cardiovascular: Normal rate and regular rhythm.   Pulmonary/Chest: Breath sounds normal.  Lymphadenopathy:    She has no cervical adenopathy.  Neurological: She is alert and oriented to person, place, and time.  Skin: Skin is warm.  Psychiatric: She has a normal mood and affect. Her behavior is normal.          Assessment & Plan:  Acute migraine, status migrainous - will tx acutely with toradol and phenergan combination injection. Need to start prophylaxis as well.  Will start low dose notriptyline.  F/U in 6 weeks. Explained it will take a few weeks to really start taking effect. Hopefully we can reduce the frequency of her migraines. Continue with current allergy regimen since this does seem to be a trigger for her migraines. Call if not better in the next  couple of days and can add 5 days of steroid.  I did give her a small quantity of Valium to use as a muscle relaxer since this doesn't work well for her. Warned about the potential for sedation and can be habit forming. She agrees to use judiciously.

## 2013-11-16 NOTE — Patient Instructions (Signed)
Call if not better in the next couple of days and can add 5 days of steroid.

## 2013-11-17 ENCOUNTER — Ambulatory Visit: Payer: Medicare HMO | Admitting: Family Medicine

## 2013-11-20 ENCOUNTER — Ambulatory Visit: Payer: Medicare HMO | Admitting: Family Medicine

## 2013-12-02 ENCOUNTER — Other Ambulatory Visit (INDEPENDENT_AMBULATORY_CARE_PROVIDER_SITE_OTHER): Payer: Medicare HMO

## 2013-12-02 DIAGNOSIS — E0500 Thyrotoxicosis with diffuse goiter without thyrotoxic crisis or storm: Secondary | ICD-10-CM

## 2013-12-02 LAB — TSH: TSH: 0.46 u[IU]/mL (ref 0.35–4.50)

## 2013-12-02 LAB — T3, FREE: T3, Free: 2.5 pg/mL (ref 2.3–4.2)

## 2013-12-02 LAB — T4, FREE: FREE T4: 0.82 ng/dL (ref 0.60–1.60)

## 2013-12-03 ENCOUNTER — Other Ambulatory Visit: Payer: Self-pay | Admitting: Sports Medicine

## 2013-12-03 MED ORDER — MAGNESIUM OXIDE 400 MG PO TABS: 800.0000 mg | ORAL_TABLET | Freq: Every day | ORAL | Status: DC

## 2013-12-28 ENCOUNTER — Ambulatory Visit (INDEPENDENT_AMBULATORY_CARE_PROVIDER_SITE_OTHER): Payer: Medicare HMO | Admitting: Family Medicine

## 2013-12-28 ENCOUNTER — Encounter: Payer: Self-pay | Admitting: Family Medicine

## 2013-12-28 VITALS — BP 139/68 | HR 84 | Temp 98.1°F | Ht 65.0 in | Wt 191.0 lb

## 2013-12-28 DIAGNOSIS — J411 Mucopurulent chronic bronchitis: Secondary | ICD-10-CM

## 2013-12-28 DIAGNOSIS — G43709 Chronic migraine without aura, not intractable, without status migrainosus: Secondary | ICD-10-CM

## 2013-12-28 MED ORDER — ALBUTEROL SULFATE (2.5 MG/3ML) 0.083% IN NEBU: 2.5000 mg | INHALATION_SOLUTION | RESPIRATORY_TRACT | Status: DC | PRN

## 2013-12-28 NOTE — Progress Notes (Signed)
   Subjective:    Patient ID: Laura Ellison, female    DOB: 25-Dec-1945, 68 y.o.   MRN: [id number]  HPI Migraines - she is feeling much better. Still has a headache every day.  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 She did not do well with the nortriptyline. She said it really messed with her stomach and caused sudden and persistent diarrhea.  COPD - Still SOB with walking up stairs but more so bc of pain.  She is still smoking.  Cough is occ productive  Has been sneezing as well.  Doesn't feel ike she is getting sick.    Review of Systems     Objective:   Physical Exam  Constitutional: She is oriented to person, place, and time. She appears well-developed and well-nourished.  HENT:  Head: Normocephalic and atraumatic.  Cardiovascular: Normal rate, regular rhythm and normal heart sounds.   Pulmonary/Chest: Effort normal and breath sounds normal.  Neurological: She is alert and oriented to person, place, and time.  Skin: Skin is warm and dry.  Psychiatric: She has a normal mood and affect. Her behavior is normal.          Assessment & Plan:  Migraine- Well controlled.  Not sure exactly what may have triggered them they are under better control and back to baseline. It did add nortriptyline tour intolerance list.  COPD-  stable. No recent exacerbations per would like to schedule her 1st biometry in January are February as she does tend to get a lot of spring allergies. Will give [place] a good baseline. Her last test was about a year and a half ago.

## 2014-02-12 ENCOUNTER — Other Ambulatory Visit: Payer: Self-pay | Admitting: Family Medicine

## 2014-03-05 ENCOUNTER — Other Ambulatory Visit: Payer: Self-pay | Admitting: *Deleted

## 2014-03-05 MED ORDER — OMEPRAZOLE 40 MG PO CPDR: DELAYED_RELEASE_CAPSULE | ORAL | Status: DC

## 2014-03-08 ENCOUNTER — Encounter: Payer: Self-pay | Admitting: Family Medicine

## 2014-03-08 ENCOUNTER — Ambulatory Visit (INDEPENDENT_AMBULATORY_CARE_PROVIDER_SITE_OTHER): Payer: Medicare HMO | Admitting: Family Medicine

## 2014-03-08 VITALS — BP 156/74 | HR 79 | Temp 98.0°F | Wt 188.0 lb

## 2014-03-08 DIAGNOSIS — J0100 Acute maxillary sinusitis, unspecified: Secondary | ICD-10-CM

## 2014-03-08 MED ORDER — LEVOFLOXACIN 500 MG PO TABS: 500.0000 mg | ORAL_TABLET | Freq: Every day | ORAL | Status: AC

## 2014-03-08 NOTE — Progress Notes (Signed)
   Subjective:    Patient ID: Laura Ellison, female    DOB: 1946/01/23, 69 y.o.   MRN: [id number]  HPI Has felt really "bad" x 4 days.  Severe congestion with pain over the left maxillary sinus and in the left ear.  Some inc in cough. Has COPD.  No fever, chills.  Says has been in bed the last several days bc has felt so pain.  Having pain behind her left eye.    Review of Systems     Objective:   Physical Exam  Constitutional: She is oriented to person, place, and time. She appears well-developed and well-nourished.  HENT:  Head: Normocephalic and atraumatic.  Right Ear: External ear normal.  Left Ear: External ear normal.  Nose: Nose normal.  Mouth/Throat: Oropharynx is clear and moist.  TMs and canals are clear.   Eyes: Conjunctivae and EOM are normal. Pupils are equal, round, and reactive to light.  Neck: Neck supple. No thyromegaly present.  MilD bilat ant cerv LN.   Cardiovascular: Normal rate, regular rhythm and normal heart sounds.   Pulmonary/Chest: Effort normal and breath sounds normal. She has no wheezes.  Lymphadenopathy:    She has cervical adenopathy.  Neurological: She is alert and oriented to person, place, and time.  Skin: Skin is warm and dry.  Psychiatric: She has a normal mood and affect.          Assessment & Plan:   left maxillary sinusitis-I'll go ahead and treat with Levaquin. Call if not improving by the end of the week or suddenly gets worse. Okay to continue symptomaticallyher.

## 2014-03-11 ENCOUNTER — Ambulatory Visit (INDEPENDENT_AMBULATORY_CARE_PROVIDER_SITE_OTHER): Payer: Medicare HMO | Admitting: Internal Medicine

## 2014-03-11 ENCOUNTER — Encounter: Payer: Self-pay | Admitting: Internal Medicine

## 2014-03-11 VITALS — BP 124/78 | HR 89 | Temp 98.0°F | Resp 12 | Wt 188.0 lb

## 2014-03-11 DIAGNOSIS — E010 Iodine-deficiency related diffuse (endemic) goiter: Secondary | ICD-10-CM

## 2014-03-11 DIAGNOSIS — E0500 Thyrotoxicosis with diffuse goiter without thyrotoxic crisis or storm: Secondary | ICD-10-CM

## 2014-03-11 DIAGNOSIS — E049 Nontoxic goiter, unspecified: Secondary | ICD-10-CM

## 2014-03-11 LAB — T3, FREE: T3 FREE: 2.8 pg/mL (ref 2.3–4.2)

## 2014-03-11 LAB — TSH: TSH: 2.13 u[IU]/mL (ref 0.35–4.50)

## 2014-03-11 LAB — T4, FREE: FREE T4: 0.66 ng/dL (ref 0.60–1.60)

## 2014-03-11 NOTE — Patient Instructions (Signed)
Please come back for a follow-up appointment in 6 months.  Please stop at the lab.

## 2014-03-11 NOTE — Progress Notes (Signed)
Patient ID: [name], female   DOB: [birth year], 69 y.o.   MRN: [id number]   HPI  Laura Ellison is a 69 y.o.-year-old female, returning for f/u for Graves ds. Last visit 6 mo ago.  Reviewed hx: She was dx with hyperthyroidism as a teenager. She was on medication on and off. She restarted to have hyperthyroidism as she started menopause 10 years ago in [place] (Graves ds.). She is on MMI 10 mg in am - for a long time.   She has MNG and had Bx of 2 nodules in 2013 >> benign.  Pt had a thyroid uptake and scan (07/22/2013): There is uniform uptake within thyroid gland. No nodularity. 24 hour I 131 uptake = 22.7% (normal 10-30%)  We increased her MMI to 10 mg daily after last set of TFTs showed a suppressed TSH on MMI 5 mg daily. She feels well on this dose.  Lab Results  Component Value Date   TSH 0.46 12/02/2013   TSH 0.77 09/08/2013   TSH 0.31* 07/01/2013   TSH 1.494 04/15/2013   FREET4 0.82 12/02/2013   FREET4 0.71 09/08/2013   FREET4 0.75 07/01/2013   Pt denies feeling nodules in neck, hoarseness, + dysphagia with pills /no odynophagia, SOB with lying down; she c/o: - + feeling hotter at night >> wakes her up - + fatigue - + occasionally  tremors - not new - from her previous neck surgery - no anxiety - no palpitations (had an ablation "years ago") - no hyperdefecation - no weight loss - occasionally poor sleep  I reviewed pt's medications, allergies, PMH, social hx, family hx, and changes were documented in the history of present illness. Otherwise, unchanged from my initial visit note. She has increased GERD >> increased Omeprazole.  ROS: Constitutional: see HPI Eyes: no blurry vision, no xerophthalmia ENT: no sore throat, no nodules palpated in throat, no dysphagia/odynophagia, no hoarseness Cardiovascular: no CP/SOB/palpitations/leg swelling Respiratory: + cough/+ SOB/ + wheezing Gastrointestinal: no N/V/D/C/heartburn Musculoskeletal: no muscle/joint aches Skin:  no rash, + hair loss Neurological: no tremors/numbness/tingling/dizziness, + HA Psychiatric: no depression/anxiety  PE: BP 124/78 mmHg  Pulse 89  Temp(Src) 98 F (36.7 C) (Oral)  Resp 12  Wt 188 lb (85.276 kg)  SpO2 95% Body mass index is 31.28 kg/(m^2). Wt Readings from Last 3 Encounters:  03/11/14 188 lb (85.276 kg)  03/08/14 188 lb (85.276 kg)  12/28/13 191 lb (86.637 kg)   Constitutional: overweight, in NAD Eyes: PERRLA, EOMI, no exophthalmos, no lid lag, no stare ENT: moist mucous membranes, no thyromegaly, no thyroid bruits, no cervical lymphadenopathy Cardiovascular: RRR, No MRG Respiratory: CTA B Gastrointestinal: abdomen soft, NT, ND, BS+ Musculoskeletal: no deformities, strength intact in all 4 Skin: moist, warm, no rashes Neurological: + tremor with outstretched L hand - after her cervical sx., DTR normal in all 4  ASSESSMENT: 1. Graves ds.  2. MNG  PLAN:  1. Patient with a h/o long standing Graves disease without current sxs except tremors (not new) - she has long-standing Graves and a reassuring Uptake and scan (we reviewed this today) and she would like to continue MMI rather than having RAI tx >> will continue MMI 10 mg for now since TFTs were normal, but we will check today and decrease the dose if TSH close to the ULN. - we will check the TSH, fT3 and fT4 today -  she is reticent to retry to reduce it due to the irritability that she experienced before when she tried  to reduce the dose Return in about 6 months (around 09/09/2014).  2. Pt has a h/o MNG, but she had normal Bx's in 2013 - no neck compression sxs - we may need a thyroid U/S in the future, but only if she developed neck compression sxs or a visible/palpable nodule  Office Visit on 03/11/2014  Component Date Value Ref Range Status  . TSH 03/11/2014 2.13  0.35 - 4.50 uIU/mL Final  . Free T4 03/11/2014 0.66  0.60 - 1.60 ng/dL Final  . T3, Free 03/11/2014 2.8  2.3 - 4.2 pg/mL Final    Labs are  great >> since TSH is not at the ULN, let's continue current MMI dose.

## 2014-03-22 ENCOUNTER — Other Ambulatory Visit: Payer: Medicare HMO | Admitting: Family Medicine

## 2014-03-25 ENCOUNTER — Ambulatory Visit (INDEPENDENT_AMBULATORY_CARE_PROVIDER_SITE_OTHER): Payer: Medicare HMO | Admitting: Family Medicine

## 2014-03-25 ENCOUNTER — Encounter: Payer: Self-pay | Admitting: Family Medicine

## 2014-03-25 VITALS — BP 158/76 | HR 76 | Ht 65.0 in | Wt 185.0 lb

## 2014-03-25 DIAGNOSIS — Z720 Tobacco use: Secondary | ICD-10-CM

## 2014-03-25 DIAGNOSIS — R0789 Other chest pain: Secondary | ICD-10-CM

## 2014-03-25 DIAGNOSIS — H539 Unspecified visual disturbance: Secondary | ICD-10-CM

## 2014-03-25 DIAGNOSIS — J411 Mucopurulent chronic bronchitis: Secondary | ICD-10-CM

## 2014-03-25 DIAGNOSIS — R1310 Dysphagia, unspecified: Secondary | ICD-10-CM

## 2014-03-25 MED ORDER — ALBUTEROL SULFATE (2.5 MG/3ML) 0.083% IN NEBU
2.5000 mg | INHALATION_SOLUTION | Freq: Once | RESPIRATORY_TRACT | Status: AC
  Administered 2014-03-25: 2.5 mg via RESPIRATORY_TRACT

## 2014-03-25 NOTE — Assessment & Plan Note (Signed)
COPD: spirometry 03/25/14 -FEV1 85%, FVC of 78%, ratio of 71%. Consistent with mild obstruction. No significant improved post bronchodilator.

## 2014-03-25 NOTE — Addendum Note (Signed)
Addended by: Beatrice Lecher D on: 03/25/2014 01:09 PM   Modules accepted: Level of Service

## 2014-03-25 NOTE — Progress Notes (Signed)
   Subjective:    Patient ID: Laura Ellison, female    DOB: 08-22-1945, 69 y.o.   MRN: [id number]  HPI   69 year old female with a history of COPD with evidence of mild emphysema on CT of the chest comes in today for spirometry. Her last spirometry was approximately 2 years ago. She just uses albuterol as needed. She has had a couple bouts of bronchitis this winter.  Food is getting stuck at the bedtime of the esophagus.  Hard time getting pills to move. She has lost some weight.  She can swallow liquids okay has not any problems that. The symptoms have had a gradual onset and now they're almost daily. She wanted to know if there was any findings on her chest CT that she had done back in November when they were ruling out a pulmonary embolism. No known nausea, vomiting, diarrhea, or change in bowels.   Feels like heart is squeezing. Has happened 3 times and is random.  One of those episodes when she actually went to the hospital and had a negative workup. She has not seen cardiology since then. No known triggers. No worsening or alleviating factors. Symptoms are brief.  Eye doc referral for vision changes in her left eye. Says didn't want to pick someone out of the phone book.     Review of Systems     Objective:   Physical Exam  Constitutional: She is oriented to person, place, and time. She appears well-developed.  HENT:  Head: Normocephalic and atraumatic.  Neurological: She is alert and oriented to person, place, and time.  Skin: Skin is warm and dry.  Psychiatric: She has a normal mood and affect. Her behavior is normal.          Assessment & Plan:  Mild COPD -  Reviewed spirometry results with her today. See problem list for detailed results. At this point I think that her treatment is adequate with just when necessary albuterol. Not going to put her on anything else preventative at this point. Didn't encourage her to stay away from triggers. Recommend smoking cessation.   dysphagia-recommend referral to gastroenterology. Explained this is often caused by a narrowing of the esophagus, stricture. She is Re: On a PPI. Her that it needs to be addressed and treated her will continue to get worse. Encouraged her to make sure that she is chewing her food very well. Waiting several minutes between bites to make sure that the food passes. In trying to choose more soft foods instead of more hard to eat foods like meats.   Chest pain/heart squeezing sensation- unclear if related to the esophagus. She has had a negative workup at the ED for this pain. There consider cardiology consult if symptoms are recurrent or happen after treatment of the esophagus. She is to call sooner if she notices any recurrence of symptoms.   vision change-will refer to optometry for further evaluation.

## 2014-03-30 ENCOUNTER — Other Ambulatory Visit: Payer: Self-pay | Admitting: *Deleted

## 2014-03-30 ENCOUNTER — Other Ambulatory Visit: Payer: Self-pay | Admitting: Family Medicine

## 2014-03-30 MED ORDER — LOSARTAN POTASSIUM 100 MG PO TABS: 100.0000 mg | ORAL_TABLET | Freq: Every day | ORAL | Status: DC

## 2014-03-30 NOTE — Telephone Encounter (Signed)
Needs appointment before future refills. Last appt for BP 7/15

## 2014-04-05 ENCOUNTER — Telehealth: Payer: Self-pay | Admitting: *Deleted

## 2014-04-05 NOTE — Telephone Encounter (Signed)
Advised patient that Dr Madilyn Fireman does recommend Dr Georgiann Cocker.

## 2014-04-05 NOTE — Telephone Encounter (Signed)
Pt lvm stating that she needed Dr. Madilyn Fireman to place a referral for oncology.Laura Ellison

## 2014-04-05 NOTE — Telephone Encounter (Signed)
Laura Ellison wants to talk to Dr Madilyn Fireman about her esophageal cancer. Dr Erlene Quan is recommending Dr Georgiann Cocker for oncologist. She would like Dr Gardiner Ramus recommendations.

## 2014-04-07 NOTE — Telephone Encounter (Signed)
Called and left a message with her daughter Ashby Dawes. She was taking a nap this afternoon. Does want to let her know that we are thinking about her. We will call to also get a copy of the of the biopsy report as we have not received this yet. She actually has a CT scheduled for tomorrow and I shall he has a follow-up appointment with the oncologist on Friday.

## 2014-04-10 DIAGNOSIS — C160 Malignant neoplasm of cardia: Secondary | ICD-10-CM | POA: Insufficient documentation

## 2014-04-16 ENCOUNTER — Telehealth: Payer: Self-pay | Admitting: *Deleted

## 2014-04-16 DIAGNOSIS — Z720 Tobacco use: Secondary | ICD-10-CM

## 2014-04-16 MED ORDER — BUPROPION HCL ER (SR) 150 MG PO TB12: 150.0000 mg | ORAL_TABLET | Freq: Two times a day (BID) | ORAL | Status: DC

## 2014-04-16 NOTE — Telephone Encounter (Signed)
Pt called was asking for rx for Wellbutrin to be sent to her pharmacy. She would like to let Dr. Madilyn Fireman know that she is now down to smoking 7 cigs a day!! I congratulated her on her success and told her that I would inform Dr. Madilyn Fireman about this and would get her Rx sent to her pharmacy.Elouise Munroe'

## 2014-04-19 ENCOUNTER — Telehealth: Payer: Self-pay | Admitting: Internal Medicine

## 2014-04-19 NOTE — Telephone Encounter (Signed)
Noted! So sorry to hear that!!! I reviewed the records from PCP - they are in [place].

## 2014-04-19 NOTE — Telephone Encounter (Signed)
Called pt and advised her. Pt pleased.

## 2014-04-19 NOTE — Telephone Encounter (Signed)
Please read message below.  

## 2014-04-19 NOTE — Telephone Encounter (Signed)
plese call dr Suzi Roots PCP # [phone]. 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

## 2014-04-20 ENCOUNTER — Telehealth: Payer: Self-pay | Admitting: *Deleted

## 2014-04-20 MED ORDER — FLUOXETINE HCL 10 MG PO TABS: 10.0000 mg | ORAL_TABLET | Freq: Every day | ORAL | Status: DC

## 2014-04-20 NOTE — Telephone Encounter (Signed)
Pt stated that she could not tolerate wellbutrin it caused her to have diarrhea and abdominal pain. She asked if Dr. Madilyn Fireman would write a rx for Prozac. She had heard that this may be able to help her with her smoking.   I spoke with Dr. Madilyn Fireman and she sent a Rx for Prozac 10 mg caps to pharm.Audelia Hives [employer]

## 2014-04-21 ENCOUNTER — Ambulatory Visit (INDEPENDENT_AMBULATORY_CARE_PROVIDER_SITE_OTHER): Payer: Medicare HMO | Admitting: Sports Medicine

## 2014-04-21 ENCOUNTER — Telehealth: Payer: Self-pay | Admitting: *Deleted

## 2014-04-21 ENCOUNTER — Encounter: Payer: Self-pay | Admitting: Sports Medicine

## 2014-04-21 VITALS — BP 172/78 | HR 70 | Wt 183.0 lb

## 2014-04-21 DIAGNOSIS — M25511 Pain in right shoulder: Secondary | ICD-10-CM | POA: Diagnosis not present

## 2014-04-21 NOTE — Assessment & Plan Note (Signed)
Pain is referable to the glenohumeral joint. We are going to do a glenohumeral injection as above. Return in one month, home rehabilitation exercises given.

## 2014-04-21 NOTE — Progress Notes (Signed)
  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 The pain is moderate, persistent without radiation. It does keep her up at night.  Past medical history, Surgical history, Family history not pertinant except as noted below, Social history, Allergies, and medications have been entered into the medical record, reviewed, and no changes needed.   Review of Systems: No fevers, chills, night sweats, weight loss, chest pain, or shortness of breath.   Objective:    General: Well Developed, well nourished, and in no acute distress.  Neuro: Alert and oriented x3, extra-ocular muscles intact, sensation grossly intact.  HEENT: Normocephalic, atraumatic, pupils equal round reactive to light, neck supple, no masses, no lymphadenopathy, thyroid nonpalpable.  Skin: Warm and dry, no rashes. Cardiac: Regular rate and rhythm, no murmurs rubs or gallops, no lower extremity edema.  Respiratory: Clear to auscultation bilaterally. Not using accessory muscles, speaking in full sentences. Right Shoulder: Inspection reveals no abnormalities, atrophy or asymmetry. Palpation is normal with no tenderness over AC joint or bicipital groove. ROM is full in all planes. Rotator cuff strength normal throughout. No signs of impingement with negative Neer and Hawkin's tests, empty can. Speeds and Yergason's tests normal. Positive crank test, tender to palpation over the glenohumeral joint Normal scapular function observed. No painful arc and no drop arm sign. No apprehension sign  Procedure: Real-time Ultrasound Guided Injection of right glenohumeral joint Device: GE Logiq E  Verbal informed consent obtained.  Time-out conducted.  Noted no overlying erythema, induration, or other signs of local infection.  Skin  prepped in a sterile fashion.  Local anesthesia: Topical Ethyl chloride.  With sterile technique and under real time ultrasound guidance:  Spinal needle advanced into the joint, 1 mL kenalog 40, 4 mL lidocaine injected easily. Completed without difficulty  Pain immediately resolved suggesting accurate placement of the medication.  Advised to call if fevers/chills, erythema, induration, drainage, or persistent bleeding.  Images permanently stored and available for review in the ultrasound unit.  Impression: Technically successful ultrasound guided injection.  Impression and Recommendations:

## 2014-04-22 NOTE — Telephone Encounter (Signed)
Sounds like a steroid flare, over the counter aleve or motrin and icing 20 mins 3-4 times a day, have her call back at the end of the day to see how things have improved.  Usually this doesn't last but one day in a small percentage of people and will drastically improve.

## 2014-04-22 NOTE — Telephone Encounter (Signed)
Called patient & husband Mr Aziz said she was asleep. Left the following message & instructions:  Sounds like a steroid flare, over the counter aleve or motrin and icing 20 mins 3-4 times a day, have her call back at the end of the day to see how things have improved. Usually this doesn't last but one day in a small percentage of people and will drastically improve.

## 2014-05-19 ENCOUNTER — Ambulatory Visit: Payer: Medicare HMO | Admitting: Sports Medicine

## 2014-05-20 ENCOUNTER — Ambulatory Visit (INDEPENDENT_AMBULATORY_CARE_PROVIDER_SITE_OTHER): Payer: Medicare HMO | Admitting: Sports Medicine

## 2014-05-20 ENCOUNTER — Encounter: Payer: Self-pay | Admitting: Sports Medicine

## 2014-05-20 VITALS — BP 117/66 | HR 85 | Ht 65.0 in | Wt 176.0 lb

## 2014-05-20 DIAGNOSIS — M25511 Pain in right shoulder: Secondary | ICD-10-CM | POA: Diagnosis not present

## 2014-05-20 NOTE — Assessment & Plan Note (Signed)
Unfortunately patient did have a steroid flare the day after the injection however she's been doing her rehabilitation exercises and returns today completely pain-free. Continue rehabilitation exercises, and return as needed.

## 2014-05-20 NOTE — Progress Notes (Signed)
  Subjective:    CC: Follow-up  HPI: Laura Ellison returns, she had right-sided shoulder pain referable to the glenohumeral joint, and performed a glenohumeral injection on the day of her visit, and she went through home physical therapy. She returns today completely pain-free with good motion and strength, happy with results. She did have a steroid flare one day after the injection which resolved quickly with topical icing.  Past medical history, Surgical history, Family history not pertinant except as noted below, Social history, Allergies, and medications have been entered into the medical record, reviewed, and no changes needed.   Review of Systems: No fevers, chills, night sweats, weight loss, chest pain, or shortness of breath.   Objective:    General: Well Developed, well nourished, and in no acute distress.  Neuro: Alert and oriented x3, extra-ocular muscles intact, sensation grossly intact.  HEENT: Normocephalic, atraumatic, pupils equal round reactive to light, neck supple, no masses, no lymphadenopathy, thyroid nonpalpable.  Skin: Warm and dry, no rashes. Cardiac: Regular rate and rhythm, no murmurs rubs or gallops, no lower extremity edema.  Respiratory: Clear to auscultation bilaterally. Not using accessory muscles, speaking in full sentences. Right Shoulder: Inspection reveals no abnormalities, atrophy or asymmetry. Palpation is normal with no tenderness over AC joint or bicipital groove. ROM is full in all planes. Rotator cuff strength normal throughout. No signs of impingement with negative Neer and Hawkin's tests, empty can. Speeds and Yergason's tests normal. No labral pathology noted with negative Obrien's, negative crank, negative clunk, and good stability. Normal scapular function observed. No painful arc and no drop arm sign. No apprehension sign  Impression and Recommendations:

## 2014-06-03 ENCOUNTER — Other Ambulatory Visit: Payer: Self-pay | Admitting: Internal Medicine

## 2014-06-07 ENCOUNTER — Other Ambulatory Visit: Payer: Self-pay | Admitting: Family Medicine

## 2014-06-25 ENCOUNTER — Encounter: Payer: Self-pay | Admitting: Family Medicine

## 2014-06-25 ENCOUNTER — Ambulatory Visit (INDEPENDENT_AMBULATORY_CARE_PROVIDER_SITE_OTHER): Payer: Medicare HMO | Admitting: Family Medicine

## 2014-06-25 VITALS — BP 130/82 | HR 104 | Wt 163.0 lb

## 2014-06-25 DIAGNOSIS — J411 Mucopurulent chronic bronchitis: Secondary | ICD-10-CM | POA: Diagnosis not present

## 2014-06-25 DIAGNOSIS — I10 Essential (primary) hypertension: Secondary | ICD-10-CM

## 2014-06-25 DIAGNOSIS — E8881 Metabolic syndrome: Secondary | ICD-10-CM | POA: Diagnosis not present

## 2014-06-25 LAB — POCT GLYCOSYLATED HEMOGLOBIN (HGB A1C): Hemoglobin A1C: 5.8

## 2014-06-25 NOTE — Progress Notes (Signed)
   Subjective:    Patient ID: Laura Ellison, female    DOB: 05-03-1945, 69 y.o.   MRN: [id number]  HPI F/U COPD - She has been coughing more and seeing some clear phlegm. NO change in color or thickness.  Feels like the pollen etc is aggrevating her cough.  No fever, chills. No SOB and CP.   IFG - no inc thrist or urination.   HTN- she had lost 20 lbs.  WEnt to ED on 06/07/14 bc she passed out and they told her to hold her BP meds and monitor them. She has been activity getting chemo and radiation for lung Cancer.   Review of Systems     Objective:   Physical Exam  Constitutional: She is oriented to person, place, and time. She appears well-developed and well-nourished.  HENT:  Head: Normocephalic and atraumatic.  Cardiovascular: Normal rate, regular rhythm and normal heart sounds.   Pulmonary/Chest: Effort normal and breath sounds normal.  Neurological: She is alert and oriented to person, place, and time.  Skin: Skin is warm and dry.  Psychiatric: She has a normal mood and affect. Her behavior is normal.          Assessment & Plan:  COPD - well controlled. Continue current regimen. F/U in 4 months. Will be due for flu shot then. Still encouraed smoking cessation.    IFG - Stable at 5.8.  Recheck in 6-12 months.  HTN - well controlled off of her medication.  Did encourage her to continue to monitor her blood pressure very carefully at home. She just completed her chemotherapy and radiation on Monday. Hopefully in the next few weeks she'll get her appetite back and feel like she can eat again. If she does in her blood pressure may gradually go back up and we may need to restart the losartan.

## 2014-06-28 ENCOUNTER — Encounter: Payer: Self-pay | Admitting: Family Medicine

## 2014-07-08 ENCOUNTER — Other Ambulatory Visit: Payer: Self-pay | Admitting: Family Medicine

## 2014-07-08 NOTE — Telephone Encounter (Signed)
Attempted to contact Pt to see if dietary changes have caused her BP to go back up resulting in refill required. Phone number listed in chart was not working.

## 2014-07-30 ENCOUNTER — Other Ambulatory Visit: Payer: Self-pay | Admitting: Internal Medicine

## 2014-08-06 ENCOUNTER — Other Ambulatory Visit: Payer: Self-pay | Admitting: Family Medicine

## 2014-08-23 ENCOUNTER — Telehealth: Payer: Self-pay | Admitting: *Deleted

## 2014-08-23 NOTE — Telephone Encounter (Signed)
Pt called today wanting to know if you could recommend her a therapist that specializes in addiction.  She said that she has got to stop smoking and nothing is working. Please advise.

## 2014-08-24 NOTE — Telephone Encounter (Signed)
Lets call behavioral health and see if they recommend someone to help her quit smoking. I don't know of anyone off the top of her head.

## 2014-08-26 NOTE — Telephone Encounter (Signed)
No suggestions from behavioral health downstairs.  I was given a phone number to the main office in [place].  I tried calling but couldn't get a live person on the phone.  I called pt to notify her and give her the number as well.

## 2014-09-01 ENCOUNTER — Other Ambulatory Visit: Payer: Self-pay | Admitting: Internal Medicine

## 2014-09-09 ENCOUNTER — Encounter: Payer: Self-pay | Admitting: Internal Medicine

## 2014-09-09 ENCOUNTER — Ambulatory Visit (INDEPENDENT_AMBULATORY_CARE_PROVIDER_SITE_OTHER): Payer: Medicare HMO | Admitting: Internal Medicine

## 2014-09-09 VITALS — BP 128/72 | HR 81 | Temp 97.9°F | Resp 14 | Wt 160.0 lb

## 2014-09-09 DIAGNOSIS — E0500 Thyrotoxicosis with diffuse goiter without thyrotoxic crisis or storm: Secondary | ICD-10-CM | POA: Diagnosis not present

## 2014-09-09 DIAGNOSIS — E049 Nontoxic goiter, unspecified: Secondary | ICD-10-CM

## 2014-09-09 DIAGNOSIS — E010 Iodine-deficiency related diffuse (endemic) goiter: Secondary | ICD-10-CM

## 2014-09-09 LAB — T3, FREE: T3, Free: 2.5 pg/mL (ref 2.3–4.2)

## 2014-09-09 LAB — TSH: TSH: 4.85 u[IU]/mL — AB (ref 0.35–4.50)

## 2014-09-09 LAB — T4, FREE: Free T4: 0.64 ng/dL (ref 0.60–1.60)

## 2014-09-09 MED ORDER — METHIMAZOLE 10 MG PO TABS: 5.0000 mg | ORAL_TABLET | Freq: Every day | ORAL | Status: DC

## 2014-09-09 NOTE — Progress Notes (Signed)
Patient ID: Laura Ellison, female   DOB: May 20, 1945, 69 y.o.   MRN: [id number]   HPI  Laura Ellison is a 69 y.o.-year-old female, returning for f/u for Graves ds. Last visit 6 mo ago.  She has been diagnosed with esophageal cancer in 04/2014 >> completed ChTx and RxTx.  She cut smoking in 1/3.  Reviewed hx: She was dx with hyperthyroidism as a teenager. She was on medication on and off. She restarted to have hyperthyroidism as she started menopause 10 years ago in [place] (Graves ds.). She is on MMI 10 mg in am - for a long time.   She has MNG and had Bx of 2 nodules in 2013 >> benign.  Pt had a thyroid uptake and scan (07/22/2013): There is uniform uptake within thyroid gland. No nodularity. 24 hour I 131 uptake = 22.7% (normal 10-30%)  We increased her MMI to 10 mg daily after last set of TFTs showed a suppressed TSH on MMI 5 mg daily. She feels well on this dose, and her tests have been consistently normal:  Lab Results  Component Value Date   TSH 2.13 03/11/2014   TSH 0.46 12/02/2013   TSH 0.77 09/08/2013   TSH 0.31* 07/01/2013   TSH 1.494 04/15/2013   FREET4 0.66 03/11/2014   FREET4 0.82 12/02/2013   FREET4 0.71 09/08/2013   FREET4 0.75 07/01/2013   Pt denies feeling nodules in neck, hoarseness, + dysphagia with pills /no odynophagia, SOB with lying down; she c/o: - + weight loss 2/2 ChTx and RxTx >> now appetite better - no hot flushes - no fatigue - + tremors - not new - from her previous neck surgery - no anxiety - no palpitations (had an ablation "years ago") - no hyperdefecation/+ constipation - no weight loss - no poor sleep  ROS: Constitutional: see HPI Eyes: + L eye blurry vision ("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 Otherwise, unchanged from my initial visit note.   PE: BP 128/72 mmHg  Pulse 81  Temp(Src) 97.9 F (36.6 C) (Oral)  Resp 14  Wt 160 lb (72.576 kg)  SpO2 96% Body mass index is 26.63 kg/(m^2). Wt Readings from Last 3 Encounters:  09/09/14 160 lb (72.576 kg)  06/25/14 163 lb (73.936 kg)  05/20/14 176 lb (79.833 kg)   Constitutional: overweight, in NAD Eyes: PERRLA, EOMI, no exophthalmos, no lid lag, no stare ENT: moist mucous membranes, no thyromegaly, no thyroid bruits, no cervical lymphadenopathy Cardiovascular: RRR, No MRG Respiratory: CTA B Gastrointestinal: abdomen soft, NT, ND, BS+ Musculoskeletal: no deformities, strength intact in all 4 Skin: moist, warm, no rashes Neurological: + tremor with outstretched L hand - after her cervical sx., DTR normal in all 4  ASSESSMENT: 1. Graves ds.  2. MNG  PLAN:  1. Patient with a h/o long standing Graves disease without current sxs except tremors (not new). - she has long-standing Graves and a reassuring Uptake and scan obtained 07/2013)  - we discussed about different modalities of tx >> she responded well to MMI, prefers MMI rather than having RAI tx >> will continue MMI 10 mg for now, but we will check  today and decrease the dose if TSH close to the ULN. - we will check the TSH, fT3 and fT4 today  Return in about 6 months (around 03/12/2015).  2. Pt has a h/o MNG, but she had normal Bx's in 2013 - no neck compression sxs - we may need a thyroid U/S in the future, but only if she developed neck compression sxs or a visible/palpable nodule  Office Visit on 09/09/2014  Component Date Value Ref Range Status  . T3, Free 09/09/2014 2.5  2.3 - 4.2 pg/mL Final  . Free  T4 09/09/2014 0.64  0.60 - 1.60 ng/dL Final  . TSH 09/09/2014 4.85* 0.35 - 4.50 uIU/mL Final   TSH a little high >> decrease MMI to 5 mg daily >> repeat TFTs in 1.5 mo.

## 2014-09-09 NOTE — Patient Instructions (Signed)
Please stop at the lab.  Please come back for a follow-up appointment in 6 months.  

## 2014-09-10 ENCOUNTER — Other Ambulatory Visit: Payer: Self-pay | Admitting: Family Medicine

## 2014-09-10 MED ORDER — LEVOFLOXACIN 500 MG PO TABS: 500.0000 mg | ORAL_TABLET | Freq: Every day | ORAL | Status: AC

## 2014-09-23 ENCOUNTER — Telehealth: Payer: Self-pay | Admitting: *Deleted

## 2014-09-23 NOTE — Telephone Encounter (Signed)
Let have her come in.

## 2014-09-23 NOTE — Telephone Encounter (Signed)
Pt called and lvm stating that she is still not any better from the sinus infection that she was treated for about 1 wk ago. She would like Dr. Madilyn Fireman to call something else in for her to take. Will fwd to pcp for f/u.Laura Ellison

## 2014-09-24 ENCOUNTER — Ambulatory Visit (INDEPENDENT_AMBULATORY_CARE_PROVIDER_SITE_OTHER): Payer: Medicare HMO | Admitting: Family Medicine

## 2014-09-24 ENCOUNTER — Encounter: Payer: Self-pay | Admitting: Family Medicine

## 2014-09-24 VITALS — BP 122/75 | HR 75 | Temp 97.8°F | Wt 161.0 lb

## 2014-09-24 DIAGNOSIS — J0101 Acute recurrent maxillary sinusitis: Secondary | ICD-10-CM | POA: Diagnosis not present

## 2014-09-24 MED ORDER — AZITHROMYCIN 250 MG PO TABS: ORAL_TABLET | ORAL | Status: DC

## 2014-09-24 NOTE — Progress Notes (Signed)
   Subjective:    Patient ID: Laura Ellison, female    DOB: 04-24-1945, 69 y.o.   MRN: [id number]  HPI Patient was her with her husband on 09/10/14 and felt she was getting sick with a sinus infection. C/O congestion and cough.  No fevers but having a lot of sinus pressure. Bc she is under treatment for stomach cancer.  She was given levaquin and says she started to feel better. Completed 7 day course but now she is feeling bad again.  Stil no fevers. Says her facial cheek and forehead and left eye hurt. All on the left side.  Says her ear has a lot of pressure. Says started back about 2 days after   Review of Systems     Objective:   Physical Exam  Constitutional: She is oriented to person, place, and time. She appears well-developed and well-nourished.  HENT:  Head: Normocephalic and atraumatic.  Right Ear: External ear normal.  Left Ear: External ear normal.  Nose: Nose normal.  Mouth/Throat: Oropharynx is clear and moist.  TMs and canals are clear.   Eyes: Conjunctivae and EOM are normal. Pupils are equal, round, and reactive to light.  Neck: Neck supple. No thyromegaly present.  Cardiovascular: Normal rate, regular rhythm and normal heart sounds.   Pulmonary/Chest: Effort normal and breath sounds normal. She has no wheezes.  Lymphadenopathy:    She has no cervical adenopathy.  Neurological: She is alert and oriented to person, place, and time.  Skin: Skin is warm and dry.  Psychiatric: She has a normal mood and affect.          Assessment & Plan:  Recurrent sinusitis-failed seven-day course of Levaquin. Was switched to azithromycin she's tolerated well in the past. She's intolerant to amoxicillin and Augmentin. Call back in one week if not significantly improved. Continue sent to Medicare. Stay hydrated. Over-the-counter pain relievers acceptable for pain relief for the headache and facial pressure and pain.

## 2014-10-05 ENCOUNTER — Other Ambulatory Visit: Payer: Self-pay | Admitting: *Deleted

## 2014-10-05 ENCOUNTER — Telehealth: Payer: Self-pay | Admitting: Internal Medicine

## 2014-10-05 ENCOUNTER — Other Ambulatory Visit: Payer: Self-pay | Admitting: Internal Medicine

## 2014-10-05 MED ORDER — METHIMAZOLE 5 MG PO TABS: 5.0000 mg | ORAL_TABLET | Freq: Three times a day (TID) | ORAL | Status: DC

## 2014-10-05 MED ORDER — METHIMAZOLE 5 MG PO TABS: 5.0000 mg | ORAL_TABLET | Freq: Every day | ORAL | Status: DC

## 2014-10-05 NOTE — Telephone Encounter (Signed)
Wrong sig sent to pharmacy. Corrected.

## 2014-10-05 NOTE — Telephone Encounter (Signed)
[facility] in [place] is calling for clarification on the methimazole rx please call her [phone]

## 2014-10-05 NOTE — Telephone Encounter (Signed)
Corrected rx sent to the pharmacy. Called pharmacy and advised them.

## 2014-10-07 ENCOUNTER — Encounter (HOSPITAL_COMMUNITY): Payer: Self-pay | Admitting: Clinical

## 2014-10-07 ENCOUNTER — Ambulatory Visit (INDEPENDENT_AMBULATORY_CARE_PROVIDER_SITE_OTHER): Payer: Medicare HMO | Admitting: Clinical

## 2014-10-07 DIAGNOSIS — F17213 Nicotine dependence, cigarettes, with withdrawal: Secondary | ICD-10-CM | POA: Diagnosis not present

## 2014-10-07 DIAGNOSIS — R440 Auditory hallucinations: Secondary | ICD-10-CM

## 2014-10-07 NOTE — Progress Notes (Signed)
Patient:   Laura Ellison   DOB:   1945/04/25  MR Number:  [id number]  Location:  [facility] [id number] [place] [address] Dept: [phone]           Date of Service:   10/07/2014  Start Time:   9:15 End Time:   10:20  Provider/Observer:  Jerel Shepherd Counselor       Billing Code/Service: [phone]  Behavioral Observation: Laura Ellison  presents as a 69 y.o.-year-old Caucasian [place] Female who appeared her stated age. her dress was Appropriate and she was Casual and her manners were Appropriate to the situation.  There were not any physical disabilities noted.  she displayed an appropriate level of cooperation and motivation.    Interactions:    Active   Attention:   normal  Memory:   normal  Speech (Volume):  normal  Speech:   normal pitch and normal volume  Thought Process:  Coherent and Relevant  Though Content:  WNL  Orientation:   person, place and situation  Judgment:   Fair  Planning:   Fair  Affect:    Appropriate  Mood:    Depressed  Insight:   Fair  Intelligence:   normal  Chief Complaint:     Chief Complaint  Patient presents with  . Stress  . Anxiety  . 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   Education:   some Careers adviser History:  None  Careers adviser:  None   Religious/Spiritual  Preferences:  [employer] technologist History:                         Grew up in [place], [place].  Growing up was very stressful. My grandmother lived with [place]. My father worked 2 jobs. My mother was trying to get her GED. I had ADD and was a terrible student." I lived with them until I was married at 29." In two weeks it will be  67  Years. 2 kids - Son [name]. (82) -  Daughter (26)  - 2 grandchildren Female [place] and [place]  All live together with 2 cats and a dog - House  Natural/Informal Support:                           Imediate family, cousin Tomasa Hosteller,    Substance Use:  There is a documented history of tobacco abuse confirmed by the patient.   Down to 1 pack 3 daily, just since March   "Started smoking around age 41. Started smoking but I smoked more and more, my Mother and father were still smoking." She reports Longest I was able to abstain from cigarettes was 3 months ( age late 89's).  "I was  desperate for one, I wopuld have been willing to mug someone for one. It was that bad." She reports that the auditory hallucinationsbegan prior smoking -"but they were not as intense. They are quite when I am having a cigarette. Hasn't been bad except when I am anxious about trying to stop smoking."  "When I was being treated for cancer I was down to 10 cigarettes a day. It was way more crowded (auditory hallucinations) in my head. They talk ugly to me when I am trying to not to smoking." "The only command voice is to smoke,  Never to harm self or others."   Medical History:   Past Medical History  Diagnosis Date  . Hypertension   . Hyperlipidemia   . thyroid problem   . Pinched nerve in neck   . Emphysema   . Family history of anesthesia complication     "son had a problem waking up"  . Dysrhythmia     Hx; of palpitations in the past  . Anxiety     Hx; of situational anxiety  . Seasonal allergies     Hx: of  . Pneumonia     Hx:of a "touch' of PNA  . Heart murmur     Hx:  of as a child  . GERD (gastroesophageal reflux disease)   . Headache(784.0)   . Arthritis   . Anemia     Hx: of as a teen  . IBS (irritable bowel syndrome)     Hx; of          Medication List       This list is accurate as of: 10/07/14  9:30 AM.  Always use your most recent med list.               albuterol 108 (90 BASE) MCG/ACT inhaler  Commonly known as:  VENTOLIN HFA  Inhale 2 puffs into the lungs every 6 (six) hours as needed for wheezing.     ALPRAZolam 0.25 MG tablet  Commonly known as:  XANAX  Take 0.25 mg by mouth at bedtime as needed for anxiety.     azithromycin 250 MG tablet  Commonly known as:  ZITHROMAX  2 tabs on Day1 then one a day x 4 days.     CLARITIN PO  Take by mouth as needed.     magnesium oxide 400 MG tablet  Commonly known as:  MAG-OX  Take 2 tablets (800 mg total) by mouth at bedtime.     methimazole 5 MG tablet  Commonly known as:  TAPAZOLE  Take 1 tablet (5 mg total) by mouth daily.     omeprazole 40 MG capsule  Commonly known as:  PRILOSEC  TAKE TWO CAPSULES BY MOUTH DAILY              Sexual History:   History  Sexual Activity  . Sexual Activity: Not Currently     Abuse/Trauma History: No childhood abuse     No abuse and adult   Psychiatric History:  No inpatient therapy     Counseling - with husband -    Strengths:   "Problem solving"   Recovery Goals:  " Going back to 10 a day or less."  Hobbies/Interests:               " Make note cards, bake and cross stitch."    Challenges/Barriers: "Creativity is a trigger for smoking."   Family Med/Psych History:  Family History  Problem Relation Age of  Onset  . Lymphoma Father   . Diabetes Maternal Grandmother   . Stroke Maternal Grandmother   . Stroke Maternal Uncle   . Heart attack      Mothers family  . Aortic aneurysm Mother     died from/Deceased  . Other Mother     Smoking    Risk of Suicide/Violence: low - Denies any current suicidal or homicidal  ideation  History of Suicide/Violence:  Had postpartum depression after son was born - walked out in to water. Was medicated. After I learned it was a hormonal I was then better able to help myself.   Psychosis:   auditory hallucinations, increase as smoking decreases.  Diagnosis:    Cigarette nicotine dependence with withdrawal  Auditory hallucinations  Impression/DX:  Laura Ellison is a 69 y.o.-year-old Caucasian [place] Female who presents cigarette nicotine dependece with withdrawal, auditory hallucinations. She reports that she was smoking 3 packs a day.  "Started smoking around age 77. Started smoking but I smoked more and more, my Mother and father were still smoking." She reports Longest I was able to abstain from cigarettes was 3 months ( age late 75's).  "I was desperate for one, I wopuld have been willing to mug someone for one. It was that bad." She reports that the auditory hallucinationsbegan prior smoking -"but they were not as intense. They are quite when I am having a cigarette. Hasn't been bad except when I am anxious about trying to stop smoking."  "When I was being treated for cancer I was down to 10 cigarettes a day. It was way more crowded (auditory hallucinations) in my head. They talk ugly to me when I am trying to not to smoking." "The only command voice is to smoke,  Never to harm self or others."   She reports a history of postpartum depression after her first son was born. She reported that she felt suicidal and walked out in to water. Her husband stopped her from going further. "I was medicated. After I learned it was a hormonal I was then better able to help myself and didn't get so bad with the other children.   She reports that her cancer doctor told her she has to quit. She realizes that it is negatively affecting her health but has been unable to stop.  Recommendation/Plan: Individual therapy 1x a week, frequency of appointments to decrease as symptoms  decrease. Follow safety plan as needed

## 2014-10-26 ENCOUNTER — Ambulatory Visit: Payer: Medicare HMO | Admitting: Family Medicine

## 2014-10-27 ENCOUNTER — Other Ambulatory Visit (INDEPENDENT_AMBULATORY_CARE_PROVIDER_SITE_OTHER): Payer: Medicare HMO

## 2014-10-27 DIAGNOSIS — E0500 Thyrotoxicosis with diffuse goiter without thyrotoxic crisis or storm: Secondary | ICD-10-CM | POA: Diagnosis not present

## 2014-10-27 LAB — TSH: TSH: 1.97 u[IU]/mL (ref 0.35–4.50)

## 2014-10-27 LAB — T4, FREE: FREE T4: 0.84 ng/dL (ref 0.60–1.60)

## 2014-10-27 LAB — T3, FREE: T3, Free: 2.9 pg/mL (ref 2.3–4.2)

## 2014-10-28 ENCOUNTER — Other Ambulatory Visit: Payer: Medicare HMO

## 2014-10-29 ENCOUNTER — Other Ambulatory Visit: Payer: Self-pay

## 2014-10-29 DIAGNOSIS — E0500 Thyrotoxicosis with diffuse goiter without thyrotoxic crisis or storm: Secondary | ICD-10-CM

## 2014-11-12 ENCOUNTER — Telehealth: Payer: Self-pay | Admitting: Family Medicine

## 2014-11-12 NOTE — Telephone Encounter (Signed)
Please call patient: She is due for colon cancer screening. Please see if she would be willing to at least do the stool cards. I know she may not be up doing a colonoscopy quite yet.

## 2014-11-12 NOTE — Telephone Encounter (Signed)
Left voicemail for Pt, callback information provided.

## 2014-11-17 DIAGNOSIS — R970 Elevated carcinoembryonic antigen [CEA]: Secondary | ICD-10-CM | POA: Diagnosis not present

## 2014-11-17 DIAGNOSIS — C160 Malignant neoplasm of cardia: Secondary | ICD-10-CM | POA: Diagnosis not present

## 2014-11-17 DIAGNOSIS — R69 Illness, unspecified: Secondary | ICD-10-CM | POA: Diagnosis not present

## 2014-11-19 DIAGNOSIS — T82594A Other mechanical complication of infusion catheter, initial encounter: Secondary | ICD-10-CM | POA: Diagnosis not present

## 2014-11-19 DIAGNOSIS — T82598A Other mechanical complication of other cardiac and vascular devices and implants, initial encounter: Secondary | ICD-10-CM | POA: Diagnosis not present

## 2014-11-19 DIAGNOSIS — Y828 Other medical devices associated with adverse incidents: Secondary | ICD-10-CM | POA: Diagnosis not present

## 2014-11-19 DIAGNOSIS — C160 Malignant neoplasm of cardia: Secondary | ICD-10-CM | POA: Diagnosis not present

## 2014-11-24 DIAGNOSIS — I10 Essential (primary) hypertension: Secondary | ICD-10-CM | POA: Diagnosis not present

## 2014-11-24 DIAGNOSIS — E0500 Thyrotoxicosis with diffuse goiter without thyrotoxic crisis or storm: Secondary | ICD-10-CM | POA: Diagnosis not present

## 2014-11-24 DIAGNOSIS — R69 Illness, unspecified: Secondary | ICD-10-CM | POA: Diagnosis not present

## 2014-11-24 DIAGNOSIS — H35342 Macular cyst, hole, or pseudohole, left eye: Secondary | ICD-10-CM | POA: Diagnosis not present

## 2014-11-24 DIAGNOSIS — R7303 Prediabetes: Secondary | ICD-10-CM | POA: Diagnosis not present

## 2014-11-24 DIAGNOSIS — J449 Chronic obstructive pulmonary disease, unspecified: Secondary | ICD-10-CM | POA: Diagnosis not present

## 2014-11-24 DIAGNOSIS — M4802 Spinal stenosis, cervical region: Secondary | ICD-10-CM | POA: Diagnosis not present

## 2014-11-24 DIAGNOSIS — E785 Hyperlipidemia, unspecified: Secondary | ICD-10-CM | POA: Diagnosis not present

## 2014-11-24 DIAGNOSIS — K219 Gastro-esophageal reflux disease without esophagitis: Secondary | ICD-10-CM | POA: Diagnosis not present

## 2014-11-30 DIAGNOSIS — C159 Malignant neoplasm of esophagus, unspecified: Secondary | ICD-10-CM | POA: Diagnosis not present

## 2014-11-30 DIAGNOSIS — R918 Other nonspecific abnormal finding of lung field: Secondary | ICD-10-CM | POA: Diagnosis not present

## 2014-11-30 DIAGNOSIS — C160 Malignant neoplasm of cardia: Secondary | ICD-10-CM | POA: Diagnosis not present

## 2014-11-30 DIAGNOSIS — R970 Elevated carcinoembryonic antigen [CEA]: Secondary | ICD-10-CM | POA: Diagnosis not present

## 2014-12-02 ENCOUNTER — Ambulatory Visit (INDEPENDENT_AMBULATORY_CARE_PROVIDER_SITE_OTHER): Payer: Medicare HMO | Admitting: Family Medicine

## 2014-12-02 ENCOUNTER — Encounter: Payer: Self-pay | Admitting: Family Medicine

## 2014-12-02 VITALS — BP 143/50 | HR 81 | Temp 97.9°F | Resp 18 | Wt 153.5 lb

## 2014-12-02 DIAGNOSIS — J440 Chronic obstructive pulmonary disease with acute lower respiratory infection: Secondary | ICD-10-CM

## 2014-12-02 DIAGNOSIS — C160 Malignant neoplasm of cardia: Secondary | ICD-10-CM

## 2014-12-02 DIAGNOSIS — K2100 Gastro-esophageal reflux disease with esophagitis, without bleeding: Secondary | ICD-10-CM

## 2014-12-02 DIAGNOSIS — J209 Acute bronchitis, unspecified: Secondary | ICD-10-CM

## 2014-12-02 MED ORDER — AZITHROMYCIN 250 MG PO TABS: ORAL_TABLET | ORAL | Status: AC

## 2014-12-02 MED ORDER — PANTOPRAZOLE SODIUM 40 MG PO TBEC: 40.0000 mg | DELAYED_RELEASE_TABLET | Freq: Every day | ORAL | Status: DC

## 2014-12-02 NOTE — Progress Notes (Signed)
   Subjective:    Patient ID: Laura Ellison, female    DOB: February 14, 1945, 69 y.o.   MRN: [id number]  HPI Productive cough x 1 week in 69 year female with COPD.  Sputum is dark yellow with a bitter taste in her mouth. No fever . No SOB or wheezing. No worsening or alleviating factors.    Gastroesophageal Reflux- Feels like she has a bubble in her chest that will not come up. Just had her CT on Tuesday and had good news so does't think it is her stomach cancer.  Says her generic omeprazole was changed and feels like it is not Effexor. Says burping sometime giver her some relief. Says the discomfort is constant now. Has been getting worse of the last month. Gets worse after drinks coffee or tea or chocolate. Activity doesn't make it worse.     Review of Systems     Objective:   Physical Exam  Constitutional: She is oriented to person, place, and time. She appears well-developed and well-nourished.  HENT:  Head: Normocephalic and atraumatic.  Right Ear: External ear normal.  Left Ear: External ear normal.  Nose: Nose normal.  Mouth/Throat: Oropharynx is clear and moist.  TMs and canals are clear.   Eyes: Conjunctivae and EOM are normal. Pupils are equal, round, and reactive to light.  Neck: Neck supple. No thyromegaly present.  Cardiovascular: Normal rate, regular rhythm and normal heart sounds.   Pulmonary/Chest: Effort normal and breath sounds normal. She has no wheezes.  Diffuse rhonchi  Lymphadenopathy:    She has no cervical adenopathy.  Neurological: She is alert and oriented to person, place, and time.  Skin: Skin is warm and dry.  Psychiatric: She has a normal mood and affect.          Assessment & Plan:  Acute bronchitis with COPD exacerbation. Will tx with azithro.  She is not sig SOB so will hold off on prednisone which she often doesn't do well with. Call back if feelslike she needs the prednisone and it getting worse  Esophagitis - will switch to protonix. Sounds  like her GERD sxs started comging back when her generic omeprazole changed. If symptoms persist we may need to get her back in with her GI. This doesn't sound cardiac. Fortunately her CT scan earlier this week was reassuring with her history of stomach cancer.

## 2014-12-03 DIAGNOSIS — Y839 Surgical procedure, unspecified as the cause of abnormal reaction of the patient, or of later complication, without mention of misadventure at the time of the procedure: Secondary | ICD-10-CM | POA: Diagnosis not present

## 2014-12-03 DIAGNOSIS — T82898A Other specified complication of vascular prosthetic devices, implants and grafts, initial encounter: Secondary | ICD-10-CM | POA: Diagnosis not present

## 2014-12-03 DIAGNOSIS — T829XXA Unspecified complication of cardiac and vascular prosthetic device, implant and graft, initial encounter: Secondary | ICD-10-CM | POA: Diagnosis not present

## 2014-12-07 ENCOUNTER — Telehealth: Payer: Self-pay

## 2014-12-07 MED ORDER — OMEPRAZOLE 40 MG PO CPDR: 40.0000 mg | DELAYED_RELEASE_CAPSULE | Freq: Two times a day (BID) | ORAL | Status: DC

## 2014-12-07 NOTE — Telephone Encounter (Signed)
Notified daughter medication has been sent to [facility]

## 2014-12-07 NOTE — Telephone Encounter (Signed)
Pt states protonix is not working for her would like to know if she can switch to omeprazole. States she's taking her husbands and she getting some relief from it

## 2014-12-09 ENCOUNTER — Telehealth: Payer: Self-pay | Admitting: *Deleted

## 2014-12-09 MED ORDER — LIDOCAINE HCL 2 % EX GEL: 1.0000 "application " | Freq: Three times a day (TID) | CUTANEOUS | Status: DC | PRN

## 2014-12-09 NOTE — Telephone Encounter (Signed)
Pt called and stated that Dr. Madilyn Fireman was going to send an topical cream for the itching on her neck that had lidocaine in it to [facility].

## 2014-12-16 DIAGNOSIS — H527 Unspecified disorder of refraction: Secondary | ICD-10-CM | POA: Diagnosis not present

## 2014-12-16 DIAGNOSIS — H25813 Combined forms of age-related cataract, bilateral: Secondary | ICD-10-CM | POA: Diagnosis not present

## 2014-12-16 DIAGNOSIS — H43813 Vitreous degeneration, bilateral: Secondary | ICD-10-CM | POA: Diagnosis not present

## 2014-12-16 DIAGNOSIS — H1013 Acute atopic conjunctivitis, bilateral: Secondary | ICD-10-CM | POA: Diagnosis not present

## 2014-12-16 DIAGNOSIS — H52223 Regular astigmatism, bilateral: Secondary | ICD-10-CM | POA: Diagnosis not present

## 2014-12-16 DIAGNOSIS — H35342 Macular cyst, hole, or pseudohole, left eye: Secondary | ICD-10-CM | POA: Diagnosis not present

## 2014-12-16 DIAGNOSIS — H35373 Puckering of macula, bilateral: Secondary | ICD-10-CM | POA: Diagnosis not present

## 2014-12-16 DIAGNOSIS — S0502XA Injury of conjunctiva and corneal abrasion without foreign body, left eye, initial encounter: Secondary | ICD-10-CM | POA: Diagnosis not present

## 2014-12-21 ENCOUNTER — Telehealth: Payer: Self-pay | Admitting: *Deleted

## 2014-12-21 DIAGNOSIS — L282 Other prurigo: Secondary | ICD-10-CM

## 2014-12-21 NOTE — Telephone Encounter (Signed)
lvm informing pt that referral has been placed.Laura Ellison [place]

## 2014-12-22 DIAGNOSIS — R21 Rash and other nonspecific skin eruption: Secondary | ICD-10-CM | POA: Diagnosis not present

## 2015-01-05 DIAGNOSIS — C160 Malignant neoplasm of cardia: Secondary | ICD-10-CM | POA: Diagnosis not present

## 2015-01-05 DIAGNOSIS — Z79899 Other long term (current) drug therapy: Secondary | ICD-10-CM | POA: Diagnosis not present

## 2015-01-05 DIAGNOSIS — Z9221 Personal history of antineoplastic chemotherapy: Secondary | ICD-10-CM | POA: Diagnosis not present

## 2015-01-05 DIAGNOSIS — Z5181 Encounter for therapeutic drug level monitoring: Secondary | ICD-10-CM | POA: Diagnosis not present

## 2015-01-05 DIAGNOSIS — R918 Other nonspecific abnormal finding of lung field: Secondary | ICD-10-CM | POA: Diagnosis not present

## 2015-01-05 DIAGNOSIS — Z923 Personal history of irradiation: Secondary | ICD-10-CM | POA: Diagnosis not present

## 2015-01-05 DIAGNOSIS — R69 Illness, unspecified: Secondary | ICD-10-CM | POA: Diagnosis not present

## 2015-01-06 DIAGNOSIS — H43813 Vitreous degeneration, bilateral: Secondary | ICD-10-CM | POA: Diagnosis not present

## 2015-01-06 DIAGNOSIS — H35342 Macular cyst, hole, or pseudohole, left eye: Secondary | ICD-10-CM | POA: Diagnosis not present

## 2015-01-06 DIAGNOSIS — H31002 Unspecified chorioretinal scars, left eye: Secondary | ICD-10-CM | POA: Diagnosis not present

## 2015-01-06 DIAGNOSIS — H35371 Puckering of macula, right eye: Secondary | ICD-10-CM | POA: Diagnosis not present

## 2015-01-06 DIAGNOSIS — H52223 Regular astigmatism, bilateral: Secondary | ICD-10-CM | POA: Diagnosis not present

## 2015-01-06 DIAGNOSIS — H25813 Combined forms of age-related cataract, bilateral: Secondary | ICD-10-CM | POA: Diagnosis not present

## 2015-01-06 DIAGNOSIS — H1013 Acute atopic conjunctivitis, bilateral: Secondary | ICD-10-CM | POA: Diagnosis not present

## 2015-01-06 DIAGNOSIS — H527 Unspecified disorder of refraction: Secondary | ICD-10-CM | POA: Diagnosis not present

## 2015-01-11 ENCOUNTER — Ambulatory Visit (INDEPENDENT_AMBULATORY_CARE_PROVIDER_SITE_OTHER): Payer: Medicare HMO | Admitting: Family Medicine

## 2015-01-11 VITALS — Temp 98.0°F

## 2015-01-11 DIAGNOSIS — Z23 Encounter for immunization: Secondary | ICD-10-CM | POA: Diagnosis not present

## 2015-01-13 ENCOUNTER — Other Ambulatory Visit: Payer: Self-pay | Admitting: Sports Medicine

## 2015-02-02 ENCOUNTER — Ambulatory Visit (INDEPENDENT_AMBULATORY_CARE_PROVIDER_SITE_OTHER): Payer: Medicare HMO | Admitting: Internal Medicine

## 2015-02-02 ENCOUNTER — Encounter: Payer: Self-pay | Admitting: Internal Medicine

## 2015-02-02 ENCOUNTER — Telehealth: Payer: Self-pay | Admitting: Internal Medicine

## 2015-02-02 VITALS — BP 122/68 | HR 85 | Temp 98.1°F | Wt 157.0 lb

## 2015-02-02 DIAGNOSIS — E049 Nontoxic goiter, unspecified: Secondary | ICD-10-CM

## 2015-02-02 DIAGNOSIS — E0500 Thyrotoxicosis with diffuse goiter without thyrotoxic crisis or storm: Secondary | ICD-10-CM | POA: Diagnosis not present

## 2015-02-02 DIAGNOSIS — E010 Iodine-deficiency related diffuse (endemic) goiter: Secondary | ICD-10-CM

## 2015-02-02 LAB — T4, FREE: Free T4: 0.77 ng/dL (ref 0.60–1.60)

## 2015-02-02 LAB — TSH: TSH: 0.74 u[IU]/mL (ref 0.35–4.50)

## 2015-02-02 LAB — T3, FREE: T3, Free: 3.4 pg/mL (ref 2.3–4.2)

## 2015-02-02 NOTE — Patient Instructions (Signed)
Please stop at the lab.  Continue Methimazole 5 mg daily for now.  Please come back for a follow-up appointment in 6 months.   

## 2015-02-02 NOTE — Telephone Encounter (Signed)
Patient is calling for the results of her labs °

## 2015-02-02 NOTE — Progress Notes (Signed)
Patient ID: Laura Ellison, female   DOB: Jan 07, 1946, 69 y.o.   MRN: [id number]   HPI  Laura Ellison is a 69 y.o.-year-old female, returning for f/u for Graves ds. and MNG Last visit 4 mo ago.  She had a steroid inj 3 weeks ago for dermatitis (by Dr Tamala Julian - dermatologist).  She had eye sx in 11/2014. Vision still blurry in L eye.  She has been diagnosed with esophageal cancer in 04/2014 >> completed ChTx and RxTx (Dr Georgiann Cocker Jule Ser).  Reviewed hx: She was dx with hyperthyroidism as a teenager. She was on medication on and off. She restarted to have hyperthyroidism as she started menopause 10 years ago in [place] (Graves ds.). She is on MMI 10 mg in am - for a long time.   She has MNG and had Bx of 2 nodules in 2013 >> benign.  Pt had a thyroid uptake and scan (07/22/2013): There is uniform uptake within thyroid gland. No nodularity. 24 hour I 131 uptake = 22.7% (normal 10-30%)  We increased her MMI to 10 mg daily after last set of TFTs showed a suppressed TSH on MMI 5 mg daily. She feels well on this dose, and her tests have been consistently normal:  Lab Results  Component Value Date   TSH 1.97 10/27/2014   TSH 4.85* 09/09/2014   TSH 2.13 03/11/2014   TSH 0.46 12/02/2013   TSH 0.77 09/08/2013   FREET4 0.84 10/27/2014   FREET4 0.64 09/09/2014   FREET4 0.66 03/11/2014   FREET4 0.82 12/02/2013   FREET4 0.71 09/08/2013   Pt denies feeling nodules in neck, hoarseness, + dysphagia with pills /no odynophagia, SOB with lying down; she c/o: - + weight loss 2/2 ChTx and RxTx  - no hot flushes - no fatigue - + tremors - not new - from her previous neck surgery - no anxiety - no palpitations (had an ablation "years ago") - no hyperdefecation/+ constipation - no weight loss - no poor sleep  ROS: Constitutional: see HPI Eyes: + blurry visions, no xerophthalmia ENT: no sore throat, no nodules palpated in throat, no dysphagia/odynophagia, no hoarseness Cardiovascular: no  CP/SOB/palpitations/leg swelling Respiratory: no cough/SOB/wheezing Gastrointestinal: no N/V/D/C/heartburn Musculoskeletal: no muscle/joint aches Skin: + rash, no hair loss Neurological: no tremors/numbness/tingling/dizziness  I reviewed pt's medications, allergies, PMH, social hx, family hx, and changes were documented in the history of present illness. Otherwise, unchanged from my initial visit note.   Still smokes 1 PPD (used to smoke 3 PPDs!).  Current Outpatient Rx  Name  Route  Sig  Dispense  Refill  . Loratadine (CLARITIN PO)   Oral   Take by mouth as needed.         Marland Kitchen MAGNESIUM-OXIDE 400 (241.3 MG) MG tablet      TAKE 2 TABLETS BY MOUTH EVERY NIGHT AT BEDTIME   90 tablet   0   . methimazole (TAPAZOLE) 5 MG tablet   Oral   Take 1 tablet (5 mg total) by mouth daily.   30 tablet   2   . omeprazole (PRILOSEC) 40 MG capsule   Oral   Take 1 capsule (40 mg total) by mouth 2 (two) times daily.   60 capsule   3   . EXPIRED: albuterol (VENTOLIN HFA) 108 (90 BASE) MCG/ACT inhaler   Inhalation   Inhale 2 puffs into the lungs every 6 (six) hours as needed for wheezing.   1 Inhaler   2    PE: BP 122/68  mmHg  Pulse 85  Temp(Src) 98.1 F (36.7 C)  Wt 157 lb (71.215 kg) Body mass index is 26.13 kg/(m^2). Wt Readings from Last 3 Encounters:  02/02/15 157 lb (71.215 kg)  12/02/14 153 lb 8 oz (69.627 kg)  09/24/14 161 lb (73.029 kg)   Constitutional: overweight, in NAD Eyes: PERRLA, EOMI, no exophthalmos, no lid lag, no stare ENT: moist mucous membranes, + thyromegaly - L thyroid nodules, no cervical lymphadenopathy Cardiovascular: RRR, No MRG Respiratory: CTA B Gastrointestinal: abdomen soft, NT, ND, BS+ Musculoskeletal: no deformities, strength intact in all 4 Skin: moist, warm, no rashes Neurological: + tremor with outstretched L hand - after her cervical sx., DTR normal in all 4  ASSESSMENT: 1. Graves ds.  2. MNG  PLAN:  1. Patient with a h/o Graves  disease without current sxs except tremors (not new). - she has long-standing Graves and a reassuring Uptake and scan obtained 07/2013)  - we discussed about different modalities of tx in the past >> she responded well to MMI, prefers MMI rather than having RAI tx >> will continue MMI 5 mg for now, but we will check today and decrease the dose if TSH close to the ULN. - we will check the TSH, fT3 and fT4 today  Return in about 6 months (around 08/03/2015).  2. Pt has a h/o MNG, but she had normal Bx's in 2013 - no neck compression sxs - we may need a thyroid U/S in the future, but only if she developed neck compression sxs or a visible/palpable nodule - Pt aware that she needs to call me if she develops neck compression sxs before next appt  Office Visit on 02/02/2015  Component Date Value Ref Range Status  . Free T4 02/02/2015 0.77  0.60 - 1.60 ng/dL Final  . T3, Free 02/02/2015 3.4  2.3 - 4.2 pg/mL Final  . TSH 02/02/2015 0.74  0.35 - 4.50 uIU/mL Final   Normal TFTs.Continue same MMI dose for now.

## 2015-02-02 NOTE — Telephone Encounter (Signed)
Please see below.

## 2015-02-03 DIAGNOSIS — Z888 Allergy status to other drugs, medicaments and biological substances status: Secondary | ICD-10-CM | POA: Diagnosis not present

## 2015-02-03 DIAGNOSIS — I10 Essential (primary) hypertension: Secondary | ICD-10-CM | POA: Diagnosis not present

## 2015-02-03 DIAGNOSIS — J439 Emphysema, unspecified: Secondary | ICD-10-CM | POA: Diagnosis not present

## 2015-02-03 DIAGNOSIS — S0003XA Contusion of scalp, initial encounter: Secondary | ICD-10-CM | POA: Diagnosis not present

## 2015-02-03 DIAGNOSIS — E079 Disorder of thyroid, unspecified: Secondary | ICD-10-CM | POA: Diagnosis not present

## 2015-02-03 DIAGNOSIS — E119 Type 2 diabetes mellitus without complications: Secondary | ICD-10-CM | POA: Diagnosis not present

## 2015-02-03 DIAGNOSIS — R69 Illness, unspecified: Secondary | ICD-10-CM | POA: Diagnosis not present

## 2015-02-03 DIAGNOSIS — Z79899 Other long term (current) drug therapy: Secondary | ICD-10-CM | POA: Diagnosis not present

## 2015-02-03 DIAGNOSIS — R55 Syncope and collapse: Secondary | ICD-10-CM | POA: Diagnosis not present

## 2015-02-07 NOTE — Telephone Encounter (Signed)
Pt was called with results on 02/03/2015.

## 2015-02-09 DIAGNOSIS — L2084 Intrinsic (allergic) eczema: Secondary | ICD-10-CM | POA: Diagnosis not present

## 2015-02-11 ENCOUNTER — Ambulatory Visit (INDEPENDENT_AMBULATORY_CARE_PROVIDER_SITE_OTHER): Payer: Medicare HMO | Admitting: Family Medicine

## 2015-02-11 ENCOUNTER — Encounter: Payer: Self-pay | Admitting: Family Medicine

## 2015-02-11 VITALS — BP 142/59 | HR 90 | Temp 98.3°F | Wt 159.0 lb

## 2015-02-11 DIAGNOSIS — J441 Chronic obstructive pulmonary disease with (acute) exacerbation: Secondary | ICD-10-CM

## 2015-02-11 DIAGNOSIS — S0093XA Contusion of unspecified part of head, initial encounter: Secondary | ICD-10-CM

## 2015-02-11 DIAGNOSIS — J209 Acute bronchitis, unspecified: Secondary | ICD-10-CM | POA: Diagnosis not present

## 2015-02-11 DIAGNOSIS — R55 Syncope and collapse: Secondary | ICD-10-CM

## 2015-02-11 MED ORDER — ALBUTEROL SULFATE HFA 108 (90 BASE) MCG/ACT IN AERS: 2.0000 | INHALATION_SPRAY | Freq: Four times a day (QID) | RESPIRATORY_TRACT | Status: DC | PRN

## 2015-02-11 MED ORDER — AZITHROMYCIN 250 MG PO TABS: ORAL_TABLET | ORAL | Status: AC

## 2015-02-11 NOTE — Addendum Note (Signed)
Addended by: Beatrice Lecher D on: 02/11/2015 05:32 PM   Modules accepted: Level of Service

## 2015-02-11 NOTE — Progress Notes (Addendum)
   Subjective:    Patient ID: Laura Ellison, female    DOB: [birth year], 70 y.o.   MRN: [id number]  HPI 70 year old female with a history of COPD and stomach cancer who comes in complaining today of upper respiratory symptoms. Woke up congsted yesterday  No Temp. + post nasal drip.  + ST.  + Righ ear aching. She feels like it is getting into her chest.  Chest feel heavy.  Needs new alubterol. Her husband has similar symptoms. She has been taking some over-the-counter medication which doesn't seem to be helping.  She is actually also here for hospital follow-up. She was seen in the emergency department after she had a syncopal episode. She was making some Chex mix in the kitchen and turned to go to the bathroom and suddenly felt lightheaded and dizzy. She called out to her husband who was able to assist her. She denied feeling like she lost complete consciousness. She did have a fusion to her scalp. They did do a CT scan which just showed a hematoma superficially. Nothing worrisome internally. They also did some blood work and ruled out heart problems. She did have elevated d-dimer sedated to a CT angiogram and it was negative for pulmonary embolism. Her blood pressure was stable and she was feeling better so they let her go home. She was never admitted to the hospital.  Review of Systems     Objective:   Physical Exam  Constitutional: She is oriented to person, place, and time. She appears well-developed and well-nourished.  HENT:  Head: Normocephalic and atraumatic.  Right Ear: External ear normal.  Left Ear: External ear normal.  Nose: Nose normal.  Mouth/Throat: Oropharynx is clear and moist.  TMs and canals are clear.   Eyes: Conjunctivae and EOM are normal. Pupils are equal, round, and reactive to light.  Neck: Neck supple. No thyromegaly present.  Cardiovascular: Normal rate, regular rhythm and normal heart sounds.   Pulmonary/Chest: Effort normal and breath sounds normal. She has no  wheezes.  Lymphadenopathy:    She has no cervical adenopathy.  Neurological: She is alert and oriented to person, place, and time.  Skin: Skin is warm and dry.  Psychiatric: She has a normal mood and affect.          Assessment & Plan:  Acute bronchitis with COPD exacerbation-we'll treat with prednisone and azithromycin. Follow-up in one week if not improving. Please call the office sooner if getting worse. Also refilled her albuterol.  Near syncopal episode with contusion to the scalp-she is doing much better. She has had a couple more episodes where she felt a little bit lightheaded but not as intense as this particular episode. I did encourage her to keep an eye on her blood pressure just to make sure that it's not going low and to make sure that she is hydrating well.

## 2015-02-17 ENCOUNTER — Telehealth: Payer: Self-pay

## 2015-02-17 MED ORDER — ALBUTEROL SULFATE (2.5 MG/3ML) 0.083% IN NEBU: 2.5000 mg | INHALATION_SOLUTION | Freq: Four times a day (QID) | RESPIRATORY_TRACT | Status: AC | PRN

## 2015-02-17 NOTE — Telephone Encounter (Signed)
OK, new rx sent.  

## 2015-02-17 NOTE — Telephone Encounter (Signed)
Patient advised.

## 2015-02-17 NOTE — Telephone Encounter (Signed)
Laura Ellison called and would like medication for her nebulizer.

## 2015-02-21 ENCOUNTER — Encounter: Payer: Self-pay | Admitting: Family Medicine

## 2015-02-21 ENCOUNTER — Ambulatory Visit (INDEPENDENT_AMBULATORY_CARE_PROVIDER_SITE_OTHER): Payer: Medicare HMO | Admitting: Family Medicine

## 2015-02-21 VITALS — BP 138/59 | HR 107 | Temp 97.8°F | Wt 152.0 lb

## 2015-02-21 DIAGNOSIS — J019 Acute sinusitis, unspecified: Secondary | ICD-10-CM

## 2015-02-21 DIAGNOSIS — J209 Acute bronchitis, unspecified: Secondary | ICD-10-CM | POA: Diagnosis not present

## 2015-02-21 MED ORDER — LEVOFLOXACIN 500 MG PO TABS: 500.0000 mg | ORAL_TABLET | Freq: Every day | ORAL | Status: AC

## 2015-02-21 NOTE — Progress Notes (Signed)
   Subjective:    Patient ID: Laura Ellison, female    DOB: 1945-10-14, 70 y.o.   MRN: [id number]  HPI Here for f/U acute sinusitis.  I saw her 10 days ago for acute sinusitis and bronchitis. She says she's not feeling any better and did not feel better even while on the azithromycin. She also completed a course of prednisone which she finished yesterday. She still coughing a lot and has some pain in her upper back. As well as some soreness at the base of the rib cage just above the abdomen on both sides. She's coughing up mostly a yellowish clear mucus. She's coughing up large amounts. She does feel short of breath. She is using her home nebulizer and does get relief for a few hours with this. No fevers chills or sweats but just feels extremely fatigued. She says she's been trying to hydrate well. She denies any swelling or edema.   Review of Systems     Objective:   Physical Exam  Constitutional: She is oriented to person, place, and time. She appears well-developed and well-nourished.  HENT:  Head: Normocephalic and atraumatic.  Right Ear: External ear normal.  Left Ear: External ear normal.  Nose: Nose normal.  Mouth/Throat: Oropharynx is clear and moist.  TMs and canals are clear.   Eyes: Conjunctivae and EOM are normal. Pupils are equal, round, and reactive to light.  Neck: Neck supple. No thyromegaly present.  Cardiovascular: Normal rate, regular rhythm and normal heart sounds.   Pulmonary/Chest: Effort normal and breath sounds normal. She has no wheezes.  Diffuse mild rhonchi that improved after several deep breaths. No crackles.  Musculoskeletal: She exhibits no edema.  Lymphadenopathy:    She has no cervical adenopathy.  Neurological: She is alert and oriented to person, place, and time.  Skin: Skin is warm and dry.  Psychiatric: She has a normal mood and affect.          Assessment & Plan:  Acute sinusitis/bronchitis with no improvement after azithromycin. Will  switch to Levaquin. Did reassure her that her lungs sound okay as far as no sign of pneumonia but if she's not feeling at least some better by the end of the week and encouraged her to call back so that we can try to get a chest x-ray.

## 2015-03-02 ENCOUNTER — Other Ambulatory Visit: Payer: Self-pay | Admitting: Sports Medicine

## 2015-03-14 ENCOUNTER — Ambulatory Visit: Payer: Medicare HMO | Admitting: Internal Medicine

## 2015-03-31 DIAGNOSIS — C160 Malignant neoplasm of cardia: Secondary | ICD-10-CM | POA: Diagnosis not present

## 2015-03-31 DIAGNOSIS — J439 Emphysema, unspecified: Secondary | ICD-10-CM | POA: Diagnosis not present

## 2015-04-06 DIAGNOSIS — J438 Other emphysema: Secondary | ICD-10-CM | POA: Diagnosis not present

## 2015-04-06 DIAGNOSIS — Z9221 Personal history of antineoplastic chemotherapy: Secondary | ICD-10-CM | POA: Diagnosis not present

## 2015-04-06 DIAGNOSIS — C160 Malignant neoplasm of cardia: Secondary | ICD-10-CM | POA: Diagnosis not present

## 2015-04-06 DIAGNOSIS — Z85028 Personal history of other malignant neoplasm of stomach: Secondary | ICD-10-CM | POA: Diagnosis not present

## 2015-04-06 DIAGNOSIS — R69 Illness, unspecified: Secondary | ICD-10-CM | POA: Diagnosis not present

## 2015-04-06 DIAGNOSIS — R970 Elevated carcinoembryonic antigen [CEA]: Secondary | ICD-10-CM | POA: Diagnosis not present

## 2015-04-06 DIAGNOSIS — Z08 Encounter for follow-up examination after completed treatment for malignant neoplasm: Secondary | ICD-10-CM | POA: Diagnosis not present

## 2015-04-06 DIAGNOSIS — Z923 Personal history of irradiation: Secondary | ICD-10-CM | POA: Diagnosis not present

## 2015-04-10 DIAGNOSIS — R918 Other nonspecific abnormal finding of lung field: Secondary | ICD-10-CM | POA: Diagnosis not present

## 2015-04-10 DIAGNOSIS — Z881 Allergy status to other antibiotic agents status: Secondary | ICD-10-CM | POA: Diagnosis not present

## 2015-04-10 DIAGNOSIS — Z888 Allergy status to other drugs, medicaments and biological substances status: Secondary | ICD-10-CM | POA: Diagnosis not present

## 2015-04-10 DIAGNOSIS — R69 Illness, unspecified: Secondary | ICD-10-CM | POA: Diagnosis not present

## 2015-04-10 DIAGNOSIS — J439 Emphysema, unspecified: Secondary | ICD-10-CM | POA: Diagnosis not present

## 2015-04-10 DIAGNOSIS — Z79899 Other long term (current) drug therapy: Secondary | ICD-10-CM | POA: Diagnosis not present

## 2015-04-10 DIAGNOSIS — R0602 Shortness of breath: Secondary | ICD-10-CM | POA: Diagnosis not present

## 2015-04-10 DIAGNOSIS — J168 Pneumonia due to other specified infectious organisms: Secondary | ICD-10-CM | POA: Diagnosis not present

## 2015-04-10 DIAGNOSIS — E119 Type 2 diabetes mellitus without complications: Secondary | ICD-10-CM | POA: Diagnosis not present

## 2015-04-10 DIAGNOSIS — J189 Pneumonia, unspecified organism: Secondary | ICD-10-CM | POA: Diagnosis not present

## 2015-04-10 DIAGNOSIS — I10 Essential (primary) hypertension: Secondary | ICD-10-CM | POA: Diagnosis not present

## 2015-04-10 DIAGNOSIS — E0590 Thyrotoxicosis, unspecified without thyrotoxic crisis or storm: Secondary | ICD-10-CM | POA: Diagnosis not present

## 2015-04-19 ENCOUNTER — Encounter: Payer: Self-pay | Admitting: Family Medicine

## 2015-04-19 ENCOUNTER — Ambulatory Visit (INDEPENDENT_AMBULATORY_CARE_PROVIDER_SITE_OTHER): Payer: Medicare HMO | Admitting: Family Medicine

## 2015-04-19 VITALS — BP 142/55 | HR 94 | Wt 154.0 lb

## 2015-04-19 DIAGNOSIS — J181 Lobar pneumonia, unspecified organism: Principal | ICD-10-CM

## 2015-04-19 DIAGNOSIS — J411 Mucopurulent chronic bronchitis: Secondary | ICD-10-CM

## 2015-04-19 DIAGNOSIS — J189 Pneumonia, unspecified organism: Secondary | ICD-10-CM

## 2015-04-19 NOTE — Progress Notes (Signed)
   Subjective:    Patient ID: Laura Ellison, female    DOB: October 09, 1945, 70 y.o.   MRN: [id number]  HPI 70 year old female with a history of COPD and cancer of the stomach he went to the emergency department on March 2 for short of breath. She has done 2 albuterol treatments and did not get any relief so finally on that Sunday morning decided to go to the emergency department. Chest x-ray revealed a left lower lobe pneumonia. She was started on prednisone and Levaquin. She's coming in today to follow back up. She is feeling better. She says she still a little tired but feels like she sinus 100% better. She completed the prednisone in the antibiotics. Did a CT angiogram to rule out pulmonary embolism. It was negative.   Review of Systems     Objective:   Physical Exam  Constitutional: She is oriented to person, place, and time. She appears well-developed and well-nourished.  HENT:  Head: Normocephalic and atraumatic.  Cardiovascular: Normal rate, regular rhythm and normal heart sounds.   Pulmonary/Chest: Effort normal and breath sounds normal.  Neurological: She is alert and oriented to person, place, and time.  Skin: Skin is warm and dry.  Psychiatric: She has a normal mood and affect. Her behavior is normal.          Assessment & Plan:  Left lower no pneumonia-. We'll go ahead and place order today and she can go early next week. She is actually feeling much better.  COPD-recommend that she come back for spirometry in summer when she is feeling better. Last spirometry with in February 2016 and just showed mild obstruction. She really does not want to go on a daily inhaler if she can avoid it.

## 2015-04-21 ENCOUNTER — Other Ambulatory Visit: Payer: Self-pay | Admitting: Sports Medicine

## 2015-05-06 DIAGNOSIS — C801 Malignant (primary) neoplasm, unspecified: Secondary | ICD-10-CM | POA: Diagnosis not present

## 2015-05-06 DIAGNOSIS — C155 Malignant neoplasm of lower third of esophagus: Secondary | ICD-10-CM | POA: Diagnosis not present

## 2015-05-10 DIAGNOSIS — H35371 Puckering of macula, right eye: Secondary | ICD-10-CM | POA: Diagnosis not present

## 2015-05-10 DIAGNOSIS — H31002 Unspecified chorioretinal scars, left eye: Secondary | ICD-10-CM | POA: Diagnosis not present

## 2015-05-10 DIAGNOSIS — H31001 Unspecified chorioretinal scars, right eye: Secondary | ICD-10-CM | POA: Diagnosis not present

## 2015-05-10 DIAGNOSIS — H35372 Puckering of macula, left eye: Secondary | ICD-10-CM | POA: Diagnosis not present

## 2015-05-10 DIAGNOSIS — H35342 Macular cyst, hole, or pseudohole, left eye: Secondary | ICD-10-CM | POA: Diagnosis not present

## 2015-05-10 DIAGNOSIS — H43813 Vitreous degeneration, bilateral: Secondary | ICD-10-CM | POA: Diagnosis not present

## 2015-05-11 DIAGNOSIS — L2084 Intrinsic (allergic) eczema: Secondary | ICD-10-CM | POA: Diagnosis not present

## 2015-05-15 ENCOUNTER — Other Ambulatory Visit: Payer: Self-pay | Admitting: Family Medicine

## 2015-05-16 NOTE — Telephone Encounter (Signed)
Have to rout this medication only because this has been d/c off of patient's active med list last rx'd 2013

## 2015-05-18 ENCOUNTER — Encounter: Payer: Self-pay | Admitting: Family Medicine

## 2015-05-31 ENCOUNTER — Ambulatory Visit: Payer: Self-pay | Admitting: Family Medicine

## 2015-06-02 DIAGNOSIS — L281 Prurigo nodularis: Secondary | ICD-10-CM | POA: Diagnosis not present

## 2015-06-02 DIAGNOSIS — D485 Neoplasm of uncertain behavior of skin: Secondary | ICD-10-CM | POA: Diagnosis not present

## 2015-06-02 DIAGNOSIS — L2084 Intrinsic (allergic) eczema: Secondary | ICD-10-CM | POA: Diagnosis not present

## 2015-06-08 DIAGNOSIS — M9902 Segmental and somatic dysfunction of thoracic region: Secondary | ICD-10-CM | POA: Diagnosis not present

## 2015-06-08 DIAGNOSIS — Z472 Encounter for removal of internal fixation device: Secondary | ICD-10-CM | POA: Diagnosis not present

## 2015-06-08 DIAGNOSIS — L989 Disorder of the skin and subcutaneous tissue, unspecified: Secondary | ICD-10-CM | POA: Diagnosis not present

## 2015-06-10 ENCOUNTER — Other Ambulatory Visit: Payer: Self-pay | Admitting: Sports Medicine

## 2015-06-20 DIAGNOSIS — L2084 Intrinsic (allergic) eczema: Secondary | ICD-10-CM | POA: Diagnosis not present

## 2015-06-20 DIAGNOSIS — L239 Allergic contact dermatitis, unspecified cause: Secondary | ICD-10-CM | POA: Diagnosis not present

## 2015-06-20 DIAGNOSIS — Z79899 Other long term (current) drug therapy: Secondary | ICD-10-CM | POA: Diagnosis not present

## 2015-06-20 DIAGNOSIS — L298 Other pruritus: Secondary | ICD-10-CM | POA: Diagnosis not present

## 2015-06-22 ENCOUNTER — Other Ambulatory Visit: Payer: Self-pay | Admitting: Internal Medicine

## 2015-06-23 DIAGNOSIS — L2084 Intrinsic (allergic) eczema: Secondary | ICD-10-CM | POA: Diagnosis not present

## 2015-06-23 DIAGNOSIS — L299 Pruritus, unspecified: Secondary | ICD-10-CM | POA: Diagnosis not present

## 2015-06-28 ENCOUNTER — Telehealth: Payer: Self-pay | Admitting: Internal Medicine

## 2015-06-28 NOTE — Telephone Encounter (Signed)
Please read message below and advise. I called the dermatologist office and they will not send them with out a release.

## 2015-06-28 NOTE — Telephone Encounter (Signed)
Called pt. She stated that her TSH was 0.099. She wanted to let Dr Cruzita Lederer know and to see what she would have her to do. Please advise.

## 2015-06-28 NOTE — Telephone Encounter (Signed)
OK, let's have her take MMI every single day and come back for labs in 4 weeks.

## 2015-06-28 NOTE — Telephone Encounter (Signed)
PT called and said she had blood work done through her Dermatology office and that her TSH was on the low side.  She said her doctor is very concerned and wanted PT to inform Dr. Cruzita Lederer about this.  It is Dr. Tamala Julian out of [place], she gave the phone number for their office [phone]

## 2015-06-28 NOTE — Telephone Encounter (Signed)
Laura Ellison, let's try to obtain the thyroid tests records from the dermatologist. Also, please check with patient whether she is taking the methimazole 5 mg daily. At last visit, her tests were normal on this dose. If he was taking it consistently, let's increase the dose to 5 mg twice a day and I will recheck her thyroid tests when she comes back in a month. If she was not taking it consistently, she needs to start taking it every day.

## 2015-06-28 NOTE — Telephone Encounter (Signed)
Called pt and lvm advising her to return my call.

## 2015-06-28 NOTE — Telephone Encounter (Signed)
Patient stated that she is taking her medication methimazole 5 mg , some days she forget to take her medication. And also she stated that the dermatologist will not fax over her labs, she will have to signed a release form.

## 2015-06-29 ENCOUNTER — Other Ambulatory Visit: Payer: Self-pay | Admitting: *Deleted

## 2015-06-29 DIAGNOSIS — E0500 Thyrotoxicosis with diffuse goiter without thyrotoxic crisis or storm: Secondary | ICD-10-CM

## 2015-06-29 NOTE — Telephone Encounter (Signed)
Called pt and advised her. Pt voiced understanding and will return for labs in 4 weeks. Be advised.

## 2015-07-15 DIAGNOSIS — L298 Other pruritus: Secondary | ICD-10-CM | POA: Diagnosis not present

## 2015-07-20 DIAGNOSIS — E042 Nontoxic multinodular goiter: Secondary | ICD-10-CM | POA: Diagnosis not present

## 2015-07-20 DIAGNOSIS — R21 Rash and other nonspecific skin eruption: Secondary | ICD-10-CM | POA: Diagnosis not present

## 2015-07-20 DIAGNOSIS — Z08 Encounter for follow-up examination after completed treatment for malignant neoplasm: Secondary | ICD-10-CM | POA: Diagnosis not present

## 2015-07-20 DIAGNOSIS — Z923 Personal history of irradiation: Secondary | ICD-10-CM | POA: Diagnosis not present

## 2015-07-20 DIAGNOSIS — J449 Chronic obstructive pulmonary disease, unspecified: Secondary | ICD-10-CM | POA: Diagnosis not present

## 2015-07-20 DIAGNOSIS — Z8500 Personal history of malignant neoplasm of unspecified digestive organ: Secondary | ICD-10-CM | POA: Diagnosis not present

## 2015-07-20 DIAGNOSIS — R0689 Other abnormalities of breathing: Secondary | ICD-10-CM | POA: Diagnosis not present

## 2015-07-20 DIAGNOSIS — R69 Illness, unspecified: Secondary | ICD-10-CM | POA: Diagnosis not present

## 2015-07-20 DIAGNOSIS — Z9221 Personal history of antineoplastic chemotherapy: Secondary | ICD-10-CM | POA: Diagnosis not present

## 2015-07-20 DIAGNOSIS — C160 Malignant neoplasm of cardia: Secondary | ICD-10-CM | POA: Diagnosis not present

## 2015-07-20 DIAGNOSIS — H9319 Tinnitus, unspecified ear: Secondary | ICD-10-CM | POA: Diagnosis not present

## 2015-07-23 ENCOUNTER — Other Ambulatory Visit: Payer: Self-pay | Admitting: Sports Medicine

## 2015-07-27 DIAGNOSIS — H35342 Macular cyst, hole, or pseudohole, left eye: Secondary | ICD-10-CM | POA: Diagnosis not present

## 2015-07-27 DIAGNOSIS — H25813 Combined forms of age-related cataract, bilateral: Secondary | ICD-10-CM | POA: Diagnosis not present

## 2015-07-27 DIAGNOSIS — H52203 Unspecified astigmatism, bilateral: Secondary | ICD-10-CM | POA: Diagnosis not present

## 2015-07-27 DIAGNOSIS — H43811 Vitreous degeneration, right eye: Secondary | ICD-10-CM | POA: Diagnosis not present

## 2015-08-02 DIAGNOSIS — E785 Hyperlipidemia, unspecified: Secondary | ICD-10-CM | POA: Diagnosis not present

## 2015-08-02 DIAGNOSIS — R69 Illness, unspecified: Secondary | ICD-10-CM | POA: Diagnosis not present

## 2015-08-02 DIAGNOSIS — H25812 Combined forms of age-related cataract, left eye: Secondary | ICD-10-CM | POA: Diagnosis not present

## 2015-08-02 DIAGNOSIS — E0590 Thyrotoxicosis, unspecified without thyrotoxic crisis or storm: Secondary | ICD-10-CM | POA: Diagnosis not present

## 2015-08-02 DIAGNOSIS — J449 Chronic obstructive pulmonary disease, unspecified: Secondary | ICD-10-CM | POA: Diagnosis not present

## 2015-08-02 DIAGNOSIS — K449 Diaphragmatic hernia without obstruction or gangrene: Secondary | ICD-10-CM | POA: Diagnosis not present

## 2015-08-02 DIAGNOSIS — I10 Essential (primary) hypertension: Secondary | ICD-10-CM | POA: Diagnosis not present

## 2015-08-02 DIAGNOSIS — C159 Malignant neoplasm of esophagus, unspecified: Secondary | ICD-10-CM | POA: Diagnosis not present

## 2015-08-02 DIAGNOSIS — M1990 Unspecified osteoarthritis, unspecified site: Secondary | ICD-10-CM | POA: Diagnosis not present

## 2015-08-02 DIAGNOSIS — H25811 Combined forms of age-related cataract, right eye: Secondary | ICD-10-CM | POA: Diagnosis not present

## 2015-08-02 DIAGNOSIS — Z7982 Long term (current) use of aspirin: Secondary | ICD-10-CM | POA: Diagnosis not present

## 2015-08-03 ENCOUNTER — Encounter: Payer: Self-pay | Admitting: Internal Medicine

## 2015-08-03 ENCOUNTER — Ambulatory Visit (INDEPENDENT_AMBULATORY_CARE_PROVIDER_SITE_OTHER): Payer: Medicare HMO | Admitting: Internal Medicine

## 2015-08-03 VITALS — BP 122/74 | HR 84 | Ht 65.2 in | Wt 166.0 lb

## 2015-08-03 DIAGNOSIS — E010 Iodine-deficiency related diffuse (endemic) goiter: Secondary | ICD-10-CM

## 2015-08-03 DIAGNOSIS — R21 Rash and other nonspecific skin eruption: Secondary | ICD-10-CM

## 2015-08-03 DIAGNOSIS — E049 Nontoxic goiter, unspecified: Secondary | ICD-10-CM | POA: Diagnosis not present

## 2015-08-03 DIAGNOSIS — E0500 Thyrotoxicosis with diffuse goiter without thyrotoxic crisis or storm: Secondary | ICD-10-CM

## 2015-08-03 LAB — TSH: TSH: 1.29 u[IU]/mL (ref 0.35–4.50)

## 2015-08-03 LAB — T4, FREE: FREE T4: 0.99 ng/dL (ref 0.60–1.60)

## 2015-08-03 LAB — T3, FREE: T3 FREE: 2.9 pg/mL (ref 2.3–4.2)

## 2015-08-03 NOTE — Progress Notes (Signed)
Patient ID: Laura Ellison, female   DOB: 09-Jul-1945, 70 y.o.   MRN: [id number]   HPI  Laura Ellison is a 70 y.o.-year-old female, returning for f/u for Graves ds. and MNG Last visit 6 mo ago.  Reviewed hx: She was dx with hyperthyroidism as a teenager. She was on medication on and off. She restarted to have hyperthyroidism as she started menopause 10 years ago in [place] (Graves ds.). She is on MMI 10 mg in am - for a long time.   She has MNG and had Bx of 2 nodules in 2013 >> benign.  Pt had a thyroid uptake and scan (07/22/2013): There is uniform uptake within thyroid gland. No nodularity. 24 hour I 131 uptake = 22.7% (normal 10-30%)  We increased her MMI to 10 mg daily after she had a suppressed TSH on MMI 5 mg daily. However, we were then able to reduce the dose back to 5 mg daily.  She called [place] on 06/28/2015:   PT called and said she had blood work done through her Dermatology office and that her TSH was on the low side (0.099). She said her doctor is very concerned and wanted PT to inform Dr. Cruzita Lederer about this. It is Dr. Tamala Julian out of [place], she gave the phone number for their office [phone]  Upon Q'ing >> she was not taking the [place] consistently then >> advised her to take it every single day pending labs today.   Lab Results  Component Value Date   TSH 0.74 02/02/2015   TSH 1.97 10/27/2014   TSH 4.85* 09/09/2014   TSH 2.13 03/11/2014   TSH 0.46 12/02/2013   FREET4 0.77 02/02/2015   FREET4 0.84 10/27/2014   FREET4 0.64 09/09/2014   FREET4 0.66 03/11/2014   FREET4 0.82 12/02/2013   Pt denies feeling nodules in neck, hoarseness, + dysphagia with pills /no odynophagia, SOB with lying down; she mentions: - + fatigue - + rash - upper chest and back, upper arms - + weight gain (initially lost - after dx of Es CA) - no hot flushes - no fatigue - + tremors - not new - from her previous neck surgery - no anxiety - no palpitations (had an ablation "years ago") -  no hyperdefecation/constipation - no weight loss - no poor sleep  She has been diagnosed with esophageal cancer in 04/2014 >> completed ChTx and RxTx (Dr Georgiann Cocker Jule Ser).  ROS: Constitutional: see HPI Eyes: no blurry visions, no xerophthalmia ENT: no sore throat, no nodules palpated in throat, no dysphagia/odynophagia, no hoarseness Cardiovascular: no CP/SOB/palpitations/leg swelling Respiratory: no cough/SOB/wheezing Gastrointestinal: no N/V/D/C/heartburn Musculoskeletal: no muscle/joint aches Skin: + rash, no hair loss Neurological: no tremors/numbness/tingling/dizziness  I reviewed pt's medications, allergies, PMH, social hx, family hx, and changes were documented in the history of present illness. Otherwise, unchanged from my initial visit note.   Still smokes 1 PPD (used to smoke 3 PPDs!).  Current Outpatient Rx  Name  Route  Sig  Dispense  Refill  . albuterol (PROVENTIL) (2.5 MG/3ML) 0.083% nebulizer solution   Nebulization   Take 3 mLs (2.5 mg total) by nebulization every 6 (six) hours as needed for wheezing or shortness of breath.   150 mL   1   . albuterol (VENTOLIN HFA) 108 (90 Base) MCG/ACT inhaler   Inhalation   Inhale 2 puffs into the lungs every 6 (six) hours as needed for wheezing.   1 Inhaler   2   . Loratadine (CLARITIN PO)  Oral   Take by mouth as needed.         Marland Kitchen MAGNESIUM-OXIDE 400 (241.3 Mg) MG tablet      TAKE 2 TABLETS BY MOUTH EVERY NIGHT AT BEDTIME AS DIRECTED   90 tablet   0   . methimazole (TAPAZOLE) 5 MG tablet   Oral   Take 1 tablet (5 mg total) by mouth daily.   30 tablet   2   . omeprazole (PRILOSEC) 40 MG capsule   Oral   Take 1 capsule (40 mg total) by mouth daily.   90 capsule   1    PE: BP 122/74 mmHg  Pulse 84  Ht 5' 5.2" (1.656 m)  Wt 166 lb (75.297 kg)  BMI 27.46 kg/m2  SpO2 96% Body mass index is 27.46 kg/(m^2). Wt Readings from Last 3 Encounters:  08/03/15 166 lb (75.297 kg)  04/19/15 154 lb (69.854  kg)  02/21/15 152 lb (68.947 kg)   Constitutional: overweight, in NAD Eyes: PERRLA, EOMI, no exophthalmos, no lid lag, no stare ENT: moist mucous membranes, + thyromegaly - L thyroid nodules, no cervical lymphadenopathy Cardiovascular: RRR, No MRG Respiratory: CTA B Gastrointestinal: abdomen soft, NT, ND, BS+ Musculoskeletal: no deformities, strength intact in all 4 Skin: moist, warm, + rash in upper chest and back and upper arms - crusted papulae Neurological: + tremor with outstretched L hand - after her cervical sx., DTR normal in all 4  ASSESSMENT: 1. Graves ds.  2. MNG  3. Upper body rash  PLAN:  1. Patient with a h/o Graves disease without current sxs except tremors (not new). - she has long-standing Graves and a reassuring Uptake and scan obtained 07/2013)  - we discussed about different modalities of tx in the past >> she responded well to MMI, prefers MMI rather than having RAI tx >> will continue MMI 5 mg once a day for now, but we may need to decrease the dose if TSH close to the ULN. - we will check the TSH, fT3 and fT4 today  Return in about 6 months (around 02/02/2016).  2. Pt has a h/o MNG, but she had normal Bx's in 2013 - no neck compression sxs - we may need a thyroid U/S in the future, but only if she developed neck compression sxs or a visible/palpable nodule - Pt aware that she needs to call me if she develops neck compression sxs before next appt  2. Upper body rash - papular, crusted - doubt relationship with Graves ds - per pt's Q'ing  Needs refills - 90 days.   Component     Latest Ref Rng 08/03/2015  TSH     0.35 - 4.50 uIU/mL 1.29  T4,Free(Direct)     0.60 - 1.60 ng/dL 0.99  Triiodothyronine,Free,Serum     2.3 - 4.2 pg/mL 2.9  TSI     <140 % baseline <89   Labs are all normal, and her TSI's are undetectable. Continue methimazole 5 mg once a day and will recheck her TFTs in 3 months.

## 2015-08-03 NOTE — Patient Instructions (Signed)
Please continue Methimazole 5 mg daily for now.  Please stop at the lab.  Please come back for a follow-up appointment in 6 months.

## 2015-08-10 LAB — THYROID STIMULATING IMMUNOGLOBULIN: TSI: <89 % baseline (ref <140)

## 2015-08-10 MED ORDER — METHIMAZOLE 5 MG PO TABS
5.0000 mg | ORAL_TABLET | Freq: Every day | ORAL | Status: DC
Start: 2015-08-10 — End: 2016-02-09

## 2015-08-11 ENCOUNTER — Telehealth: Payer: Self-pay

## 2015-08-11 DIAGNOSIS — E8881 Metabolic syndrome: Secondary | ICD-10-CM | POA: Diagnosis not present

## 2015-08-11 DIAGNOSIS — J449 Chronic obstructive pulmonary disease, unspecified: Secondary | ICD-10-CM | POA: Diagnosis not present

## 2015-08-11 DIAGNOSIS — E785 Hyperlipidemia, unspecified: Secondary | ICD-10-CM | POA: Diagnosis not present

## 2015-08-11 DIAGNOSIS — Z79899 Other long term (current) drug therapy: Secondary | ICD-10-CM | POA: Diagnosis not present

## 2015-08-11 DIAGNOSIS — H52203 Unspecified astigmatism, bilateral: Secondary | ICD-10-CM | POA: Diagnosis not present

## 2015-08-11 DIAGNOSIS — H25811 Combined forms of age-related cataract, right eye: Secondary | ICD-10-CM | POA: Diagnosis not present

## 2015-08-11 DIAGNOSIS — H25813 Combined forms of age-related cataract, bilateral: Secondary | ICD-10-CM | POA: Diagnosis not present

## 2015-08-11 DIAGNOSIS — H35342 Macular cyst, hole, or pseudohole, left eye: Secondary | ICD-10-CM | POA: Diagnosis not present

## 2015-08-11 DIAGNOSIS — I10 Essential (primary) hypertension: Secondary | ICD-10-CM | POA: Diagnosis not present

## 2015-08-11 DIAGNOSIS — R69 Illness, unspecified: Secondary | ICD-10-CM | POA: Diagnosis not present

## 2015-08-11 NOTE — Telephone Encounter (Signed)
Called and left detailed message on patient answering machine per patient. Advised patient to call back to make appointment for lab in 3 months.

## 2015-09-08 DIAGNOSIS — Z0100 Encounter for examination of eyes and vision without abnormal findings: Secondary | ICD-10-CM | POA: Diagnosis not present

## 2015-09-23 ENCOUNTER — Encounter: Payer: Self-pay | Admitting: Physician Assistant

## 2015-09-23 ENCOUNTER — Ambulatory Visit (INDEPENDENT_AMBULATORY_CARE_PROVIDER_SITE_OTHER): Payer: Medicare HMO | Admitting: Physician Assistant

## 2015-09-23 VITALS — BP 153/51 | HR 85 | Temp 98.3°F | Ht 65.2 in | Wt 171.0 lb

## 2015-09-23 DIAGNOSIS — J320 Chronic maxillary sinusitis: Secondary | ICD-10-CM

## 2015-09-23 MED ORDER — AZITHROMYCIN 250 MG PO TABS: ORAL_TABLET | ORAL | 0 refills | Status: DC

## 2015-09-23 NOTE — Patient Instructions (Signed)

## 2015-09-23 NOTE — Progress Notes (Signed)
   Subjective:    Patient ID: Laura Ellison, female    DOB: 1945-02-16, 70 y.o.   MRN: [id number]  HPI Patient is a 70 year old female who presents to the clinic with left sided sinus pressure. About 4 days ago her symptoms started. She has a lot of pressure behind her left ear and radiating into her left jaw and neck. She has not tried anything to make better. She has a history of sinus infections. She denies any fever, chills, sore throat, shortness of breath. She has a chronic cough from COPD.   Review of Systems See history of present illness.    Objective:   Physical Exam  Constitutional: She is oriented to person, place, and time. She appears well-developed and well-nourished.  HENT:  Head: Normocephalic and atraumatic.  Right Ear: External ear normal.  Left Ear: External ear normal.  TMs clear bilaterally. No blood or pus. Tenderness over left maxillary sinus to palpation. Nasal turbinates red and swollen. Oropharynx erythematous with some postnasal drip present. No tonsillar swelling.  Eyes: Conjunctivae are normal. Right eye exhibits no discharge. Left eye exhibits no discharge.  Neck: Normal range of motion. Neck supple. No thyromegaly present.  Cardiovascular: Normal rate, regular rhythm and normal heart sounds.   Pulmonary/Chest: Effort normal and breath sounds normal.  Lymphadenopathy:    She has no cervical adenopathy.  Neurological: She is alert and oriented to person, place, and time.  Psychiatric: She has a normal mood and affect. Her behavior is normal.          Assessment & Plan:  Left maxillary sinusitis-she has allergy to penicillin. We'll try Z-Pak. Discuss other symptomatic care. Follow-up as needed. Encouraged Flonase 2 sprays each nostril.

## 2015-10-03 ENCOUNTER — Telehealth: Payer: Self-pay | Admitting: Family Medicine

## 2015-10-03 MED ORDER — CEFDINIR 300 MG PO CAPS: 300.0000 mg | ORAL_CAPSULE | Freq: Two times a day (BID) | ORAL | 0 refills | Status: DC

## 2015-10-03 NOTE — Telephone Encounter (Signed)
Pt called clinic today stating she was seen in office on 09/23/15 and was given a z-pac for sinus infection. Her symptoms are not better after completing course. Pt states she still has left sided facial pain, ear pain, and feel like her lymph node on the left side is swollen. Pt questions if a new Rx can be sent in of if she needs an appt. Will route to PCP for review.

## 2015-10-03 NOTE — Telephone Encounter (Signed)
Pt advised of new Rx and to contact clinic if there are still symptoms after completion. verbalized understanding.

## 2015-10-03 NOTE — Telephone Encounter (Signed)
New abx sent to pharmacy

## 2015-11-03 ENCOUNTER — Other Ambulatory Visit (INDEPENDENT_AMBULATORY_CARE_PROVIDER_SITE_OTHER): Payer: Medicare HMO

## 2015-11-03 DIAGNOSIS — E0500 Thyrotoxicosis with diffuse goiter without thyrotoxic crisis or storm: Secondary | ICD-10-CM

## 2015-11-03 LAB — T4, FREE: Free T4: 0.79 ng/dL (ref 0.60–1.60)

## 2015-11-03 LAB — TSH: TSH: 0.67 u[IU]/mL (ref 0.35–4.50)

## 2015-11-03 LAB — T3, FREE: T3, Free: 3.2 pg/mL (ref 2.3–4.2)

## 2015-11-04 ENCOUNTER — Telehealth: Payer: Self-pay | Admitting: Internal Medicine

## 2015-11-04 NOTE — Telephone Encounter (Signed)
-----   Message from Philemon Kingdom, MD sent at 11/03/2015  5:31 PM EDT ----- Laura Ellison, can you please call pt: Thyroid tests are still normal. I would suggest to continue methimazole 5 mg daily. I will recheck them when she comes back in December.

## 2015-11-04 NOTE — Telephone Encounter (Signed)
Patient is returning your call.  

## 2015-11-04 NOTE — Telephone Encounter (Signed)
Patient returning your call.

## 2015-11-04 NOTE — Telephone Encounter (Signed)
I contacted the patient and advised of message. Patient voiced understanding.  

## 2015-11-17 DIAGNOSIS — R198 Other specified symptoms and signs involving the digestive system and abdomen: Secondary | ICD-10-CM | POA: Diagnosis not present

## 2015-11-17 DIAGNOSIS — Z923 Personal history of irradiation: Secondary | ICD-10-CM | POA: Diagnosis not present

## 2015-11-17 DIAGNOSIS — Z9221 Personal history of antineoplastic chemotherapy: Secondary | ICD-10-CM | POA: Diagnosis not present

## 2015-11-17 DIAGNOSIS — R1310 Dysphagia, unspecified: Secondary | ICD-10-CM | POA: Diagnosis not present

## 2015-11-17 DIAGNOSIS — H9319 Tinnitus, unspecified ear: Secondary | ICD-10-CM | POA: Diagnosis not present

## 2015-11-17 DIAGNOSIS — R1319 Other dysphagia: Secondary | ICD-10-CM | POA: Diagnosis not present

## 2015-11-17 DIAGNOSIS — Z08 Encounter for follow-up examination after completed treatment for malignant neoplasm: Secondary | ICD-10-CM | POA: Diagnosis not present

## 2015-11-17 DIAGNOSIS — Z8501 Personal history of malignant neoplasm of esophagus: Secondary | ICD-10-CM | POA: Diagnosis not present

## 2015-11-17 DIAGNOSIS — J984 Other disorders of lung: Secondary | ICD-10-CM | POA: Diagnosis not present

## 2015-11-17 DIAGNOSIS — R69 Illness, unspecified: Secondary | ICD-10-CM | POA: Diagnosis not present

## 2015-11-17 DIAGNOSIS — D729 Disorder of white blood cells, unspecified: Secondary | ICD-10-CM | POA: Diagnosis not present

## 2015-11-17 DIAGNOSIS — R970 Elevated carcinoembryonic antigen [CEA]: Secondary | ICD-10-CM | POA: Diagnosis not present

## 2015-11-17 DIAGNOSIS — C160 Malignant neoplasm of cardia: Secondary | ICD-10-CM | POA: Diagnosis not present

## 2015-11-17 DIAGNOSIS — J438 Other emphysema: Secondary | ICD-10-CM | POA: Diagnosis not present

## 2015-11-18 DIAGNOSIS — H35371 Puckering of macula, right eye: Secondary | ICD-10-CM | POA: Diagnosis not present

## 2015-11-18 DIAGNOSIS — H43813 Vitreous degeneration, bilateral: Secondary | ICD-10-CM | POA: Diagnosis not present

## 2015-11-18 DIAGNOSIS — H35372 Puckering of macula, left eye: Secondary | ICD-10-CM | POA: Diagnosis not present

## 2015-11-18 DIAGNOSIS — H31002 Unspecified chorioretinal scars, left eye: Secondary | ICD-10-CM | POA: Diagnosis not present

## 2015-11-18 DIAGNOSIS — H31001 Unspecified chorioretinal scars, right eye: Secondary | ICD-10-CM | POA: Diagnosis not present

## 2015-11-18 DIAGNOSIS — H35342 Macular cyst, hole, or pseudohole, left eye: Secondary | ICD-10-CM | POA: Diagnosis not present

## 2015-12-06 DIAGNOSIS — R1319 Other dysphagia: Secondary | ICD-10-CM | POA: Diagnosis not present

## 2015-12-06 DIAGNOSIS — K219 Gastro-esophageal reflux disease without esophagitis: Secondary | ICD-10-CM | POA: Diagnosis not present

## 2015-12-06 DIAGNOSIS — C801 Malignant (primary) neoplasm, unspecified: Secondary | ICD-10-CM | POA: Diagnosis not present

## 2015-12-12 DIAGNOSIS — C160 Malignant neoplasm of cardia: Secondary | ICD-10-CM | POA: Diagnosis not present

## 2015-12-12 DIAGNOSIS — C155 Malignant neoplasm of lower third of esophagus: Secondary | ICD-10-CM | POA: Diagnosis not present

## 2015-12-12 DIAGNOSIS — C801 Malignant (primary) neoplasm, unspecified: Secondary | ICD-10-CM | POA: Diagnosis not present

## 2015-12-12 DIAGNOSIS — R1310 Dysphagia, unspecified: Secondary | ICD-10-CM | POA: Diagnosis not present

## 2015-12-12 DIAGNOSIS — K3189 Other diseases of stomach and duodenum: Secondary | ICD-10-CM | POA: Diagnosis not present

## 2015-12-12 DIAGNOSIS — K222 Esophageal obstruction: Secondary | ICD-10-CM | POA: Diagnosis not present

## 2015-12-14 ENCOUNTER — Ambulatory Visit (INDEPENDENT_AMBULATORY_CARE_PROVIDER_SITE_OTHER): Payer: Medicare HMO | Admitting: Family Medicine

## 2015-12-14 ENCOUNTER — Encounter: Payer: Self-pay | Admitting: Family Medicine

## 2015-12-14 ENCOUNTER — Ambulatory Visit (INDEPENDENT_AMBULATORY_CARE_PROVIDER_SITE_OTHER): Payer: Medicare HMO

## 2015-12-14 ENCOUNTER — Telehealth: Payer: Self-pay | Admitting: Family Medicine

## 2015-12-14 VITALS — BP 136/59 | HR 85 | Wt 167.0 lb

## 2015-12-14 DIAGNOSIS — Z23 Encounter for immunization: Secondary | ICD-10-CM

## 2015-12-14 DIAGNOSIS — R140 Abdominal distension (gaseous): Secondary | ICD-10-CM | POA: Diagnosis not present

## 2015-12-14 DIAGNOSIS — E8881 Metabolic syndrome: Secondary | ICD-10-CM | POA: Diagnosis not present

## 2015-12-14 DIAGNOSIS — I10 Essential (primary) hypertension: Secondary | ICD-10-CM

## 2015-12-14 DIAGNOSIS — M25551 Pain in right hip: Secondary | ICD-10-CM

## 2015-12-14 DIAGNOSIS — G8929 Other chronic pain: Secondary | ICD-10-CM

## 2015-12-14 LAB — POCT GLYCOSYLATED HEMOGLOBIN (HGB A1C): Hemoglobin A1C: 5.3

## 2015-12-14 NOTE — Telephone Encounter (Signed)
Call patient: X-ray of hip actually looks pretty good overall. She does have some arthritis of the facet joint at L5-S1. This would potentially affect the sciatic nerve on the right side which could be causing some of her pain. I like to to see one of our sports med.to further figure out where the pain is actually originating from.

## 2015-12-14 NOTE — Progress Notes (Signed)
Subjective:    CC: HTN  HPI:  Hypertension- Pt denies chest pain, SOB, dizziness, or heart palpitations.  Taking meds as directed w/o problems.  NOt on medications.   IFG - No increased thirst or urination.  Lab Results  Component Value Date   HGBA1C 5.8 06/25/2014    She does complain of right hip pain. She says it's been going on for several months. She says it's worse at night if she turns over on to the hip or if she is walking a lot. She feels like it's a deep sharp pain at times. It's not tender to touch. She denies any known injury or trauma. She has known osteoarthritis of other joints.  She also complains of lower abdominal pain. She has seen GI and was told that she did have some diverticuli. She says ever since she's had a radiation and chemotherapy her bowels have not been right. She's had excess gas and bloating. She thought about trying a probiotic was but was a little nervous to do so. She just had her esophagus dilated on Monday and that has helped with her swallowing issues.   Past medical history, Surgical history, Family history not pertinant except as noted below, Social history, Allergies, and medications have been entered into the medical record, reviewed, and corrections made.   Review of Systems: No fevers, chills, night sweats, weight loss, chest pain, or shortness of breath.   Objective:    General: Well Developed, well nourished, and in no acute distress.  Neuro: Alert and oriented x3, extra-ocular muscles intact, sensation grossly intact.  HEENT: Normocephalic, atraumatic  Skin: Warm and dry, no rashes. Cardiac: Regular rate and rhythm, no murmurs rubs or gallops, no lower extremity edema.  Respiratory: Clear to auscultation bilaterally. Not using accessory muscles, speaking in full sentences. MSK: Right hip is nontender on exam. Normal flexion extension. She did have some discomfort with external rotation of the hip. Strength at the hip, knee, ankles are 5  out of 5 bilaterally. Patellar reflexes 2+ bilaterally   Impression and Recommendations:   Blood pressure is well controlled. We'll continue to monitor every 6 months.  Impaired fasting glucose-A1c is improved from previous. Continue to keep up the good habits. Lab Results  Component Value Date   HGBA1C 5.3 12/14/2015   Right hip pain-we'll get a plain film x-ray today for further evaluation. Suspect that she probably has some arthritis. She is not tender directly over the bursa. She has great range of motion though.will call with results.   Abd bloating/gas- commend a natural culture yogurt or the probiotic called Align.  If not better after a month then please let me know.

## 2015-12-14 NOTE — Patient Instructions (Addendum)
Align probiotic

## 2015-12-15 NOTE — Telephone Encounter (Signed)
Pt notified of recommendations

## 2015-12-20 ENCOUNTER — Encounter: Payer: Self-pay | Admitting: Family Medicine

## 2015-12-23 DIAGNOSIS — C160 Malignant neoplasm of cardia: Secondary | ICD-10-CM | POA: Diagnosis not present

## 2015-12-23 DIAGNOSIS — J439 Emphysema, unspecified: Secondary | ICD-10-CM | POA: Diagnosis not present

## 2015-12-28 DIAGNOSIS — R1310 Dysphagia, unspecified: Secondary | ICD-10-CM | POA: Diagnosis not present

## 2015-12-28 DIAGNOSIS — D729 Disorder of white blood cells, unspecified: Secondary | ICD-10-CM | POA: Diagnosis not present

## 2015-12-28 DIAGNOSIS — C160 Malignant neoplasm of cardia: Secondary | ICD-10-CM | POA: Diagnosis not present

## 2015-12-28 DIAGNOSIS — H9319 Tinnitus, unspecified ear: Secondary | ICD-10-CM | POA: Diagnosis not present

## 2015-12-28 DIAGNOSIS — R634 Abnormal weight loss: Secondary | ICD-10-CM | POA: Diagnosis not present

## 2015-12-28 DIAGNOSIS — R69 Illness, unspecified: Secondary | ICD-10-CM | POA: Diagnosis not present

## 2015-12-28 DIAGNOSIS — J438 Other emphysema: Secondary | ICD-10-CM | POA: Diagnosis not present

## 2015-12-28 DIAGNOSIS — Z9221 Personal history of antineoplastic chemotherapy: Secondary | ICD-10-CM | POA: Diagnosis not present

## 2015-12-28 DIAGNOSIS — I10 Essential (primary) hypertension: Secondary | ICD-10-CM | POA: Diagnosis not present

## 2015-12-28 DIAGNOSIS — C787 Secondary malignant neoplasm of liver and intrahepatic bile duct: Secondary | ICD-10-CM | POA: Diagnosis not present

## 2015-12-28 DIAGNOSIS — C786 Secondary malignant neoplasm of retroperitoneum and peritoneum: Secondary | ICD-10-CM | POA: Diagnosis not present

## 2016-01-09 DIAGNOSIS — R69 Illness, unspecified: Secondary | ICD-10-CM | POA: Diagnosis not present

## 2016-01-09 DIAGNOSIS — C786 Secondary malignant neoplasm of retroperitoneum and peritoneum: Secondary | ICD-10-CM | POA: Diagnosis not present

## 2016-01-09 DIAGNOSIS — C159 Malignant neoplasm of esophagus, unspecified: Secondary | ICD-10-CM | POA: Diagnosis not present

## 2016-01-09 DIAGNOSIS — C787 Secondary malignant neoplasm of liver and intrahepatic bile duct: Secondary | ICD-10-CM | POA: Diagnosis not present

## 2016-01-09 DIAGNOSIS — C160 Malignant neoplasm of cardia: Secondary | ICD-10-CM | POA: Diagnosis not present

## 2016-01-09 DIAGNOSIS — R970 Elevated carcinoembryonic antigen [CEA]: Secondary | ICD-10-CM | POA: Diagnosis not present

## 2016-01-09 DIAGNOSIS — Z452 Encounter for adjustment and management of vascular access device: Secondary | ICD-10-CM | POA: Diagnosis not present

## 2016-01-13 DIAGNOSIS — I10 Essential (primary) hypertension: Secondary | ICD-10-CM | POA: Diagnosis not present

## 2016-01-13 DIAGNOSIS — C786 Secondary malignant neoplasm of retroperitoneum and peritoneum: Secondary | ICD-10-CM | POA: Diagnosis not present

## 2016-01-13 DIAGNOSIS — R1909 Other intra-abdominal and pelvic swelling, mass and lump: Secondary | ICD-10-CM | POA: Diagnosis not present

## 2016-01-13 DIAGNOSIS — C159 Malignant neoplasm of esophagus, unspecified: Secondary | ICD-10-CM | POA: Diagnosis not present

## 2016-01-13 DIAGNOSIS — E0590 Thyrotoxicosis, unspecified without thyrotoxic crisis or storm: Secondary | ICD-10-CM | POA: Diagnosis not present

## 2016-01-13 DIAGNOSIS — Z79899 Other long term (current) drug therapy: Secondary | ICD-10-CM | POA: Diagnosis not present

## 2016-01-13 DIAGNOSIS — C160 Malignant neoplasm of cardia: Secondary | ICD-10-CM | POA: Diagnosis not present

## 2016-01-13 DIAGNOSIS — C161 Malignant neoplasm of fundus of stomach: Secondary | ICD-10-CM | POA: Diagnosis not present

## 2016-01-13 DIAGNOSIS — R69 Illness, unspecified: Secondary | ICD-10-CM | POA: Diagnosis not present

## 2016-01-13 DIAGNOSIS — C787 Secondary malignant neoplasm of liver and intrahepatic bile duct: Secondary | ICD-10-CM | POA: Diagnosis not present

## 2016-01-17 DIAGNOSIS — C160 Malignant neoplasm of cardia: Secondary | ICD-10-CM | POA: Diagnosis not present

## 2016-02-02 ENCOUNTER — Ambulatory Visit: Payer: Self-pay | Admitting: Internal Medicine

## 2016-02-02 DIAGNOSIS — C160 Malignant neoplasm of cardia: Secondary | ICD-10-CM | POA: Diagnosis not present

## 2016-02-08 DIAGNOSIS — C801 Malignant (primary) neoplasm, unspecified: Secondary | ICD-10-CM | POA: Diagnosis not present

## 2016-02-08 DIAGNOSIS — K219 Gastro-esophageal reflux disease without esophagitis: Secondary | ICD-10-CM | POA: Diagnosis not present

## 2016-02-08 DIAGNOSIS — R1310 Dysphagia, unspecified: Secondary | ICD-10-CM | POA: Diagnosis not present

## 2016-02-08 DIAGNOSIS — J449 Chronic obstructive pulmonary disease, unspecified: Secondary | ICD-10-CM | POA: Diagnosis not present

## 2016-02-09 ENCOUNTER — Other Ambulatory Visit: Payer: Self-pay | Admitting: Internal Medicine

## 2016-02-09 DIAGNOSIS — R110 Nausea: Secondary | ICD-10-CM | POA: Diagnosis not present

## 2016-02-09 DIAGNOSIS — I10 Essential (primary) hypertension: Secondary | ICD-10-CM | POA: Diagnosis not present

## 2016-02-09 DIAGNOSIS — Z888 Allergy status to other drugs, medicaments and biological substances status: Secondary | ICD-10-CM | POA: Diagnosis not present

## 2016-02-09 DIAGNOSIS — C159 Malignant neoplasm of esophagus, unspecified: Secondary | ICD-10-CM | POA: Diagnosis not present

## 2016-02-09 DIAGNOSIS — Z8501 Personal history of malignant neoplasm of esophagus: Secondary | ICD-10-CM | POA: Diagnosis not present

## 2016-02-09 DIAGNOSIS — Z87891 Personal history of nicotine dependence: Secondary | ICD-10-CM | POA: Diagnosis not present

## 2016-02-09 DIAGNOSIS — R51 Headache: Secondary | ICD-10-CM | POA: Diagnosis not present

## 2016-02-09 DIAGNOSIS — J439 Emphysema, unspecified: Secondary | ICD-10-CM | POA: Diagnosis not present

## 2016-02-09 DIAGNOSIS — Z880 Allergy status to penicillin: Secondary | ICD-10-CM | POA: Diagnosis not present

## 2016-02-09 DIAGNOSIS — Z79899 Other long term (current) drug therapy: Secondary | ICD-10-CM | POA: Diagnosis not present

## 2016-02-09 DIAGNOSIS — E0590 Thyrotoxicosis, unspecified without thyrotoxic crisis or storm: Secondary | ICD-10-CM | POA: Diagnosis not present

## 2016-02-09 DIAGNOSIS — E119 Type 2 diabetes mellitus without complications: Secondary | ICD-10-CM | POA: Diagnosis not present

## 2016-02-10 DIAGNOSIS — N390 Urinary tract infection, site not specified: Secondary | ICD-10-CM | POA: Diagnosis not present

## 2016-02-10 DIAGNOSIS — Z5111 Encounter for antineoplastic chemotherapy: Secondary | ICD-10-CM | POA: Diagnosis not present

## 2016-02-10 DIAGNOSIS — I10 Essential (primary) hypertension: Secondary | ICD-10-CM | POA: Diagnosis not present

## 2016-02-10 DIAGNOSIS — C786 Secondary malignant neoplasm of retroperitoneum and peritoneum: Secondary | ICD-10-CM | POA: Diagnosis not present

## 2016-02-10 DIAGNOSIS — C160 Malignant neoplasm of cardia: Secondary | ICD-10-CM | POA: Diagnosis not present

## 2016-02-10 DIAGNOSIS — R1319 Other dysphagia: Secondary | ICD-10-CM | POA: Diagnosis not present

## 2016-02-10 DIAGNOSIS — C787 Secondary malignant neoplasm of liver and intrahepatic bile duct: Secondary | ICD-10-CM | POA: Diagnosis not present

## 2016-02-10 DIAGNOSIS — R970 Elevated carcinoembryonic antigen [CEA]: Secondary | ICD-10-CM | POA: Diagnosis not present

## 2016-02-10 DIAGNOSIS — J449 Chronic obstructive pulmonary disease, unspecified: Secondary | ICD-10-CM | POA: Diagnosis not present

## 2016-02-10 DIAGNOSIS — R69 Illness, unspecified: Secondary | ICD-10-CM | POA: Diagnosis not present

## 2016-02-24 ENCOUNTER — Ambulatory Visit (INDEPENDENT_AMBULATORY_CARE_PROVIDER_SITE_OTHER): Payer: Medicare HMO | Admitting: Internal Medicine

## 2016-02-24 ENCOUNTER — Encounter: Payer: Self-pay | Admitting: Internal Medicine

## 2016-02-24 VITALS — BP 122/88 | HR 78 | Ht 65.0 in | Wt 159.0 lb

## 2016-02-24 DIAGNOSIS — Z87891 Personal history of nicotine dependence: Secondary | ICD-10-CM | POA: Diagnosis not present

## 2016-02-24 DIAGNOSIS — C160 Malignant neoplasm of cardia: Secondary | ICD-10-CM | POA: Diagnosis not present

## 2016-02-24 DIAGNOSIS — E010 Iodine-deficiency related diffuse (endemic) goiter: Secondary | ICD-10-CM | POA: Diagnosis not present

## 2016-02-24 DIAGNOSIS — I10 Essential (primary) hypertension: Secondary | ICD-10-CM | POA: Diagnosis not present

## 2016-02-24 DIAGNOSIS — E0500 Thyrotoxicosis with diffuse goiter without thyrotoxic crisis or storm: Secondary | ICD-10-CM | POA: Diagnosis not present

## 2016-02-24 DIAGNOSIS — R69 Illness, unspecified: Secondary | ICD-10-CM | POA: Diagnosis not present

## 2016-02-24 NOTE — Patient Instructions (Signed)
Please come back for labs in 4 weeks.  Continue Methimazole 5 mg daily.  Please come back for a follow-up appointment in 6 months.

## 2016-02-24 NOTE — Progress Notes (Signed)
Patient ID: Laura Ellison, female   DOB: 04/29/1945, 71 y.o.   MRN: [id number]   HPI  Laura Ellison is a 71 y.o.-year-old female, returning for f/u for Graves ds. and MNG Last visit 6 mo ago.  She has been diagnosed with esophageal cancer in 04/2014 >> completed ChTx and RxTx (Dr Georgiann Cocker [facility]). 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 She was on medication on and off. She restarted to have hyperthyroidism as she started menopause 10 years ago in [place] (Graves ds.). She was on MMI 10 mg in am - for a long time.   She has MNG and had Bx of 2 nodules in 2013 >> benign.  Pt had a thyroid uptake and scan (07/22/2013): Uniform uptake within thyroid gland. No nodularity. 24 hour I 131 uptake = 22.7% (normal 10-30%)  We increased her MMI to 10 mg daily after she had a suppressed TSH on MMI 5 mg daily. However, we were then able to reduce the dose back to 5 mg daily, which she continues now.   Reviewed: 02/10/2016: TSH 1.04 ([facility]) - before starting immunotherapy Lab Results  Component Value Date   TSH 0.67 11/03/2015   TSH 1.29 08/03/2015   TSH 0.74 02/02/2015   TSH 1.97 10/27/2014   TSH 4.85 (H) 09/09/2014   FREET4 0.79 11/03/2015   FREET4 0.99 08/03/2015   FREET4 0.77 02/02/2015   FREET4 0.84 10/27/2014   FREET4 0.64 09/09/2014   Pt denies feeling nodules in neck, hoarseness, + dysphagia with pills /no odynophagia, SOB with lying down; she mentions: - + fatigue - + rash - upper chest and back, upper arms - + weight loss - no hot flushes - + constipation - + fatigue - + tremors - not new - from her previous neck surgery >> worse - no anxiety - no palpitations (had an ablation "years ago") - no hyperdefecation/constipation  ROS: Constitutional: see HPI, + decreased appetite Eyes: no blurry visions, no xerophthalmia ENT: no sore throat, no  nodules palpated in throat, + dysphagia/no odynophagia, no hoarseness, + tinnitus Cardiovascular: no CP/SOB/palpitations/leg swelling Respiratory: no cough/SOB/wheezing Gastrointestinal: no N/V/D/+ C/no heartburn Musculoskeletal: no muscle/joint aches Skin: + rash, + hair loss Neurological: + tremors/no numbness/tingling/dizziness, + HA  I reviewed pt's medications, allergies, PMH, social hx, family hx, and changes were documented in the history of present illness. Otherwise, unchanged from my initial visit note.   Current Outpatient Prescriptions  Medication Sig Dispense Refill  . albuterol (PROVENTIL) (2.5 MG/3ML) 0.083% nebulizer solution Take 3 mLs (2.5 mg total) by nebulization every 6 (six) hours as needed for wheezing or shortness of breath. 150 mL 1  . Loratadine (CLARITIN PO) Take by mouth as needed.    . methimazole (TAPAZOLE) 5 MG tablet TAKE 1 TABLET(5 MG) BY MOUTH DAILY 90 tablet 0  . omeprazole (PRILOSEC) 40 MG capsule Take 40 mg by mouth daily.     No current facility-administered medications for this visit.    PE: BP 122/88 (BP Location: Left Arm, Patient Position: Sitting)   Pulse 78   Ht 5\' 5"  (1.651 m)   Wt 159 lb (72.1 kg)   SpO2 97%   BMI 26.46 kg/m  Body mass index is 26.46 kg/m. Wt Readings from Last 3 Encounters:  02/24/16 159 lb (72.1 kg)  12/14/15 167 lb (75.8 kg)  09/23/15 171 lb (77.6 kg)  Constitutional: overweight, in NAD Eyes: PERRLA, EOMI, no exophthalmos, no lid lag, no stare ENT: moist mucous membranes, + thyromegaly - L>R, no cervical lymphadenopathy Cardiovascular: RRR, No MRG Respiratory: CTA B Gastrointestinal: abdomen soft, NT, ND, BS+ Musculoskeletal: no deformities, strength intact in all 4 Skin: moist, warm, + rash in upper back, near hairline - crusted papulae Neurological: + tremor with outstretched B hands, DTR normal in all 4  ASSESSMENT: 1. Graves ds.  2. MNG  PLAN:  1. Patient with Graves disease without current sxs  except tremors (not new). - she has long-standing Graves and a reassuring Uptake and scan obtained 07/2013)  - we discussed about different modalities of tx in the past >> she responded well to MMI, prefers MMI rather than having RAI tx >> will continue MMI 5 mg once a day for now, but we may need to decrease the dose if TSH close to the ULN. - we discussed about the new studies showing good success with low dose MMI for long term Graves tx - we reviewed last TSH from 2 weeks ago >> normal. Will recheck TFTs in 4 weeks, after 6 weeks of Immunotherapy Return in about 6 months (around 08/23/2016).  2. Pt has a h/o MNG, but she had normal Bx's in 2013 - no neck compression sxs - Pt will call me if she develops neck compression sxs before next appt  Philemon Kingdom, MD PhD [facility] Endocrinology

## 2016-03-06 ENCOUNTER — Ambulatory Visit (INDEPENDENT_AMBULATORY_CARE_PROVIDER_SITE_OTHER): Payer: Medicare HMO | Admitting: Family Medicine

## 2016-03-06 ENCOUNTER — Encounter: Payer: Self-pay | Admitting: Family Medicine

## 2016-03-06 ENCOUNTER — Telehealth: Payer: Self-pay | Admitting: Family Medicine

## 2016-03-06 VITALS — BP 166/73 | HR 91 | Ht 65.0 in | Wt 158.0 lb

## 2016-03-06 DIAGNOSIS — R2689 Other abnormalities of gait and mobility: Secondary | ICD-10-CM

## 2016-03-06 DIAGNOSIS — R110 Nausea: Secondary | ICD-10-CM | POA: Diagnosis not present

## 2016-03-06 DIAGNOSIS — R251 Tremor, unspecified: Secondary | ICD-10-CM

## 2016-03-06 LAB — VITAMIN B12: Vitamin B-12: 284 pg/mL (ref 200–1100)

## 2016-03-06 NOTE — Progress Notes (Signed)
Subjective:    Patient ID: Laura Ellison, female    DOB: 01/28/1946, 71 y.o.   MRN: [id number]  HPI 89 female complains of hand shaking that started before Thanksgiving.   She has known esophageal cancer with recurrence and mets to liver.Marland Kitchen She was originally diagnosed March 2016 and is followed by Dr. Georgiann Cocker here in [place]. Started immunotherapy 02/10/15.  She is currently being treated for Graves' disease. She is currently on methimazole 5 mg daily. Also currently on Wellbutrin I believe for smoking cessation.She does feel like the tremor is worse in the right hand compared to the left and she does feel like it has been progressing over the last couple of months. It has not affected any other part of her body such as her legs or neck or head. She denies any known head trauma or injury. No family history of tremor. She says the tremor seems to be better when she's resting her hands is more noticeable when she goes to do something. It's been difficult to write and eat at times and she's had difficulty with her fine motor skills like sewing which she normally enjoys.She has a has a history of cervical spinal stenosis status post decompression/discectomy.   No aggravating or alleviating factors. No specific timing of the tremor. She also complains of some nausea and balance issues. She denies any changes with her vision recently. No incontinence issues.   B12 deficiency-she has not been taking her supplement.  Review of Systems   BP (!) 166/73   Pulse 91   Ht 5\' 5"  (1.651 m)   Wt 158 lb (71.7 kg)   SpO2 99%   BMI 26.29 kg/m     Allergies  Allergen Reactions  . Amoxicillin-Pot Clavulanate Diarrhea    Severe abdominal cramps and diarrhea  . Crestor [Rosuvastatin Calcium] Other (See Comments)    GI symptoms.    . Fenofibrate Other (See Comments)    GI sxs.   . Lipitor [Atorvastatin Calcium] Other (See Comments)    GI upset/stomach cramping  . Livalo [Pitavastatin] Other (See  Comments)  . Nortriptyline Other (See Comments)    Diarrhea   . Other     Pt states that strong ABT's cause bloody diarrhea  . Pravachol   . Prednisone Other (See Comments)    Chest discomfort with burst, has to do taper.  . Taxol [Paclitaxel]   . Welchol [Colesevelam Hcl] Other (See Comments)    Cramping   . Wellbutrin [Bupropion] Diarrhea and Other (See Comments)    Abdominal cramping   . Zocor [Simvastatin - High Dose]     Past Medical History:  Diagnosis Date  . Anemia    Hx: of as a teen  . Anxiety    Hx; of situational anxiety  . Arthritis   . Dysrhythmia    Hx; of palpitations in the past  . Emphysema   . Family history of anesthesia complication    "son had a problem waking up"  . GERD (gastroesophageal reflux disease)   . Headache(784.0)   . Heart murmur    Hx: of as a child  . Hyperlipidemia   . Hypertension   . IBS (irritable bowel syndrome)    Hx; of  . Pinched nerve in neck   . Pneumonia    Hx:of a "touch' of PNA  . Seasonal allergies    Hx: of  . thyroid problem     Past Surgical History:  Procedure Laterality Date  . ABLATION  Hx: of  . ANTERIOR CERVICAL DECOMPRESSION/DISCECTOMY FUSION 4 LEVELS N/A 12/25/2012   Procedure: ANTERIOR CERVICAL DECOMPRESSION/DISCECTOMY FUSION 4 LEVELS;  Surgeon: Sinclair Ship, MD;  Location: [facility];  Service: Orthopedics;  Laterality: N/A;  Anterior cervical decompression fusion, cervical 4-5, cervical 5-6, cervical 6-7, cervical 7-thoracic 1 with instrumentation, allograft  . APPENDECTOMY    . CARDIAC CATHETERIZATION    . CHOLECYSTECTOMY    . CHOLECYSTECTOMY, LAPAROSCOPIC  07/13/2010  . CYST EXCISION     Hx: of several  . EYE SURGERY     Hx: of cyst removed from left eye  . FINE NEEDLE ASPIRATION     Hx: of right breast  . TONSILLECTOMY      Social History   Social History  . Marital status: Married    Spouse name: [name]   . Number of children: 2  . Years of education: HS   Occupational  History  . Retired.     Social History Main Topics  . Smoking status: Former Smoker    Packs/day: 1.00    Years: 50.00    Types: Cigarettes    Quit date: 12/14/2015  . Smokeless tobacco: Never Used  . Alcohol use Yes     Comment: " occasional"  . Drug use: No  . Sexual activity: Not Currently   Other Topics Concern  . Not on file   Social History Narrative   10 caffeinated drinks daily. No regular exercise. She recently moved here from [place] she now lives with her husband, daughter, granddaughter, son and sometimes grandson.    Family History  Problem Relation Age of Onset  . Lymphoma Father   . Aortic aneurysm Mother     died from/Deceased  . Other Mother     Smoking  . Diabetes Maternal Grandmother   . Stroke Maternal Grandmother   . Stroke Maternal Uncle   . Heart attack      Mothers family    Outpatient Encounter Prescriptions as of 03/06/2016  Medication Sig  . buPROPion (ZYBAN) 150 MG 12 hr tablet Take 150 mg by mouth.  Marland Kitchen albuterol (PROVENTIL) (2.5 MG/3ML) 0.083% nebulizer solution Take 3 mLs (2.5 mg total) by nebulization every 6 (six) hours as needed for wheezing or shortness of breath.  . butalbital-acetaminophen-caffeine (FIORICET, ESGIC) 50-325-40 MG tablet   . Loratadine (CLARITIN PO) Take by mouth as needed.  . methimazole (TAPAZOLE) 5 MG tablet TAKE 1 TABLET(5 MG) BY MOUTH DAILY  . omeprazole (PRILOSEC) 40 MG capsule Take 40 mg by mouth daily.  . [DISCONTINUED] BuPROPion HCl (WELLBUTRIN PO) Take by mouth.  . [DISCONTINUED] lidocaine-prilocaine (EMLA) cream    No facility-administered encounter medications on file as of 03/06/2016.           Objective:   Physical Exam  Constitutional: She is oriented to person, place, and time. She appears well-developed and well-nourished.  HENT:  Head: Normocephalic and atraumatic.  Right Ear: External ear normal.  Left Ear: External ear normal.  Nose: Nose normal.  Mouth/Throat: Oropharynx is clear  and moist.  TMs and canals are clear.   Eyes: Conjunctivae and EOM are normal. Pupils are equal, round, and reactive to light.  Neck: Neck supple. No thyromegaly present.  Cardiovascular: Normal rate, regular rhythm and normal heart sounds.   Pulmonary/Chest: Effort normal and breath sounds normal. She has no wheezes.  Lymphadenopathy:    She has no cervical adenopathy.  Neurological: She is alert and oriented to person, place, and time. She has  normal reflexes. No cranial nerve deficit. Coordination normal.  Normal finger to nose.  Tremor is almost completely absent at rest. With holding her hands and with intention the and tremor intensifies it is more prominent on the right hand compared to the left hand and sometimes just seems to affect a couple of fingers. Tremor actually improved slightly when she squeezes her hands together.  Skin: Skin is warm and dry.  Psychiatric: She has a normal mood and affect.      Assessment & Plan:  Tremor-unclear etiology at this point. Certainly this could be a side effect from the Wellbutrin.  Though she says that the shaking actually started before the Wellbutrin but she does notice that it got worse after starting the Wellbutrin. I don't think it is the methimazole. Unlikely to be her thyroid as her gynecologist has been checking her labs frequently and monitoring her levels. Consider heavy metal toxicity. Will check for mercury and arsenic as well as copper to rule out Wilson's disease. Also discussed could be a brain metastasis and she is also complaining of balance issues as well. She did have a PET scan not that long ago but it looks like it started from the skull base and went to the thigh it does not look like it covered the brain area but I will confirm this. She has known B12 deficiency so we will recheck this level as well. She admits she has not been taking her supplementation regularly. Did discuss also could be better controlled with a medication such  as propranolol as long as we rule out some other potential causes.  B12 deficiency-we'll recheck levels.

## 2016-03-06 NOTE — Telephone Encounter (Signed)
Pt advised.

## 2016-03-06 NOTE — Telephone Encounter (Signed)
The scan did not include her brain.

## 2016-03-06 NOTE — Telephone Encounter (Signed)
Okay, thank you for checking. We please let patient know that were dime forward with ordering an MRI of the brain. Order artery placed.

## 2016-03-06 NOTE — Telephone Encounter (Signed)
Please call [facility]. Patient had a PET scan recently. It looks like the scan just started at the base of the skull and went down to the thigh just want to verify that they did not capture any imaging of her brain. Patient has had new onset tremor with balance issues and I want to make sure there is no sign of brain metastasis.  Laura Lecher, MD

## 2016-03-07 ENCOUNTER — Telehealth: Payer: Self-pay | Admitting: Family Medicine

## 2016-03-07 DIAGNOSIS — I10 Essential (primary) hypertension: Secondary | ICD-10-CM

## 2016-03-07 NOTE — Telephone Encounter (Signed)
I will place lab order. Insurance still pending authorization, do not schedule MRI yet.

## 2016-03-07 NOTE — Telephone Encounter (Signed)
Patient's daughter called and reports her mom has a feeling of "pins and needles" around her lips. I advised her that Dr Madilyn Fireman has ordered an MRI of the brain.

## 2016-03-07 NOTE — Telephone Encounter (Signed)
-----   Message from Katha Hamming sent at 03/07/2016 12:34 PM EST ----- Regarding: labs I see an MRI order for [place].  She had lab work yesterday but I do not see BUN/creatinine listed.   She will need those labs also.  Didn't know if they were included?  Thanks, Hoyle Sauer

## 2016-03-09 DIAGNOSIS — K5900 Constipation, unspecified: Secondary | ICD-10-CM | POA: Diagnosis not present

## 2016-03-09 DIAGNOSIS — R69 Illness, unspecified: Secondary | ICD-10-CM | POA: Diagnosis not present

## 2016-03-09 DIAGNOSIS — C787 Secondary malignant neoplasm of liver and intrahepatic bile duct: Secondary | ICD-10-CM | POA: Diagnosis not present

## 2016-03-09 DIAGNOSIS — Z5111 Encounter for antineoplastic chemotherapy: Secondary | ICD-10-CM | POA: Diagnosis not present

## 2016-03-09 DIAGNOSIS — Z87891 Personal history of nicotine dependence: Secondary | ICD-10-CM | POA: Diagnosis not present

## 2016-03-09 DIAGNOSIS — C160 Malignant neoplasm of cardia: Secondary | ICD-10-CM | POA: Diagnosis not present

## 2016-03-09 DIAGNOSIS — I10 Essential (primary) hypertension: Secondary | ICD-10-CM | POA: Diagnosis not present

## 2016-03-09 DIAGNOSIS — R51 Headache: Secondary | ICD-10-CM | POA: Diagnosis not present

## 2016-03-09 LAB — HEAVY METALS PANEL, BLOOD
Lead: 3 ug/dL (ref <5)
Mercury, B: <4 mcg/L (ref <=10)

## 2016-03-11 LAB — VITAMIN B1: Vitamin B1 (Thiamine): 9 nmol/L (ref 8–30)

## 2016-03-12 ENCOUNTER — Ambulatory Visit (HOSPITAL_BASED_OUTPATIENT_CLINIC_OR_DEPARTMENT_OTHER): Payer: Medicare HMO

## 2016-03-12 LAB — VITAMIN B6: VITAMIN B6: 3.3 ng/mL (ref 2.1–21.7)

## 2016-03-16 ENCOUNTER — Other Ambulatory Visit: Payer: Self-pay

## 2016-03-16 DIAGNOSIS — E0500 Thyrotoxicosis with diffuse goiter without thyrotoxic crisis or storm: Secondary | ICD-10-CM

## 2016-03-17 ENCOUNTER — Ambulatory Visit (HOSPITAL_BASED_OUTPATIENT_CLINIC_OR_DEPARTMENT_OTHER): Payer: Medicare HMO

## 2016-03-19 DIAGNOSIS — I10 Essential (primary) hypertension: Secondary | ICD-10-CM | POA: Diagnosis not present

## 2016-03-19 LAB — COMPLETE METABOLIC PANEL WITH GFR
ALT: 6 U/L (ref 6–29)
AST: 11 U/L (ref 10–35)
Albumin: 3.7 g/dL (ref 3.6–5.1)
Alkaline Phosphatase: 86 U/L (ref 33–130)
BUN: 16 mg/dL (ref 7–25)
CALCIUM: 9.1 mg/dL (ref 8.6–10.4)
CHLORIDE: 106 mmol/L (ref 98–110)
CO2: 26 mmol/L (ref 20–31)
Creat: 0.85 mg/dL (ref 0.60–0.93)
GFR, EST AFRICAN AMERICAN: 80 mL/min (ref >=60)
GFR, Est Non African American: 70 mL/min (ref >=60)
Glucose, Bld: 96 mg/dL (ref 65–99)
POTASSIUM: 4.7 mmol/L (ref 3.5–5.3)
Sodium: 141 mmol/L (ref 135–146)
Total Bilirubin: 0.4 mg/dL (ref 0.2–1.2)
Total Protein: 6.4 g/dL (ref 6.1–8.1)

## 2016-03-20 NOTE — Telephone Encounter (Signed)
All labs are normal. 

## 2016-03-20 NOTE — Telephone Encounter (Signed)
Sope with patient and told her all labs are normal.

## 2016-03-22 ENCOUNTER — Telehealth: Payer: Self-pay

## 2016-03-22 ENCOUNTER — Other Ambulatory Visit (INDEPENDENT_AMBULATORY_CARE_PROVIDER_SITE_OTHER): Payer: Medicare HMO

## 2016-03-22 DIAGNOSIS — E0500 Thyrotoxicosis with diffuse goiter without thyrotoxic crisis or storm: Secondary | ICD-10-CM

## 2016-03-22 LAB — T4, FREE: Free T4: 0.8 ng/dL (ref 0.60–1.60)

## 2016-03-22 LAB — TSH: TSH: 0.78 u[IU]/mL (ref 0.35–4.50)

## 2016-03-22 LAB — T3, FREE: T3, Free: 3.5 pg/mL (ref 2.3–4.2)

## 2016-03-22 NOTE — Telephone Encounter (Signed)
Called and LVM for patient regarding lab results. Gave call back number if any questions.

## 2016-03-22 NOTE — Telephone Encounter (Signed)
-----   Message from Philemon Kingdom, MD sent at 03/22/2016 12:13 PM EST ----- Almyra Free, can you please call pt: TFTs normal, but I would continue MMI 5 mg daily for now. Will repeat labs at next visit.

## 2016-03-23 ENCOUNTER — Telehealth: Payer: Self-pay | Admitting: Family Medicine

## 2016-03-23 NOTE — Telephone Encounter (Signed)
Let VM reminding Pt of her MRI appt.

## 2016-03-23 NOTE — Telephone Encounter (Signed)
-----   Message from Katha Hamming sent at 03/23/2016  2:06 PM EST ----- Regarding: MRI Hey Tabor Denham:  Do you think you could call this patient and reminder her of her MRI appointment tomorrow to arrive here at 10:30 am.  Our phone # will not get through to her phone.   Thanks, Hoyle Sauer

## 2016-03-24 ENCOUNTER — Ambulatory Visit (HOSPITAL_BASED_OUTPATIENT_CLINIC_OR_DEPARTMENT_OTHER)
Admission: RE | Admit: 2016-03-24 | Discharge: 2016-03-24 | Disposition: A | Discharge status: Home / Self Care | Payer: Medicare HMO | Source: Ambulatory Visit | Attending: Family Medicine | Admitting: Family Medicine

## 2016-03-24 DIAGNOSIS — R110 Nausea: Secondary | ICD-10-CM

## 2016-03-24 DIAGNOSIS — I639 Cerebral infarction, unspecified: Secondary | ICD-10-CM | POA: Insufficient documentation

## 2016-03-24 DIAGNOSIS — R2689 Other abnormalities of gait and mobility: Secondary | ICD-10-CM | POA: Insufficient documentation

## 2016-03-24 DIAGNOSIS — R251 Tremor, unspecified: Secondary | ICD-10-CM | POA: Diagnosis not present

## 2016-03-24 MED ORDER — GADOBENATE DIMEGLUMINE 529 MG/ML IV SOLN
15.0000 mL | Freq: Once | INTRAVENOUS | Status: AC | PRN
  Administered 2016-03-24: 14 mL via INTRAVENOUS

## 2016-03-28 ENCOUNTER — Ambulatory Visit (INDEPENDENT_AMBULATORY_CARE_PROVIDER_SITE_OTHER): Payer: Medicare HMO | Admitting: Family Medicine

## 2016-03-28 ENCOUNTER — Telehealth: Payer: Self-pay | Admitting: Family Medicine

## 2016-03-28 ENCOUNTER — Encounter: Payer: Self-pay | Admitting: Family Medicine

## 2016-03-28 VITALS — BP 135/56 | HR 86 | Ht 65.0 in | Wt 159.0 lb

## 2016-03-28 DIAGNOSIS — Z8673 Personal history of transient ischemic attack (TIA), and cerebral infarction without residual deficits: Secondary | ICD-10-CM

## 2016-03-28 DIAGNOSIS — R251 Tremor, unspecified: Secondary | ICD-10-CM

## 2016-03-28 DIAGNOSIS — R220 Localized swelling, mass and lump, head: Secondary | ICD-10-CM | POA: Diagnosis not present

## 2016-03-28 MED ORDER — VALACYCLOVIR HCL 1 G PO TABS: 1000.0000 mg | ORAL_TABLET | Freq: Two times a day (BID) | ORAL | 0 refills | Status: AC

## 2016-03-28 MED ORDER — METOPROLOL SUCCINATE ER 25 MG PO TB24: 25.0000 mg | ORAL_TABLET | Freq: Every day | ORAL | 3 refills | Status: DC

## 2016-03-28 NOTE — Progress Notes (Signed)
Subjective:    Patient ID: Laura Ellison, female    DOB: 1945-05-15, 71 y.o.   MRN: [id number]  HPI 71 yo female c/o of lip swelling x 2 days. Says she feels some tingling on the inside of the upper lip.  Had a similar episode about 20-30 years ago when she had a cold.  She was told at that time that it was likely herpes infection that she says she never actually broke out with a rash. She denies any fever or other upper respiratory symptoms at this time but she did have a cold about a week ago. No injury or trauma to the lip or facial area. Unsure what may have triggered this.  She would also like to discuss the tremor again as well. She had new onset nausea headaches and tremor as we decided to do an MRI of the brain just to rule out metastasis. It was negative but did show some old lacunar infarcts. She wants to know what she could do to help with her tremor.  She also wanted to go over the MRI results.  Review of Systems  BP (!) 135/56   Pulse 86   Ht 5\' 5"  (1.651 m)   Wt 159 lb (72.1 kg)   SpO2 100%   BMI 26.46 kg/m     Allergies  Allergen Reactions  . Prednisone Other (See Comments)    Other reaction(s): Other Chest discomfort with burst, has to do taper. Chest discomfort with burst, has to do taper.  . Statins     Other reaction(s): Other GI upset  . Amoxicillin-Pot Clavulanate Diarrhea    Severe abdominal cramps and diarrhea  . Crestor [Rosuvastatin Calcium] Other (See Comments)    GI symptoms.    . Fenofibrate Other (See Comments)    GI sxs.   . Lipitor [Atorvastatin Calcium] Other (See Comments)    GI upset/stomach cramping  . Livalo [Pitavastatin] Other (See Comments)  . Nortriptyline Other (See Comments)    Diarrhea   . Other     Pt states that strong ABT's cause bloody diarrhea  . Pravachol   . Taxol [Paclitaxel]   . Welchol [Colesevelam Hcl] Other (See Comments)    Cramping   . Zocor [Simvastatin - High Dose]     Past Medical History:   Diagnosis Date  . Anemia    Hx: of as a teen  . Anxiety    Hx; of situational anxiety  . Arthritis   . Dysrhythmia    Hx; of palpitations in the past  . Emphysema   . Family history of anesthesia complication    "son had a problem waking up"  . GERD (gastroesophageal reflux disease)   . Headache(784.0)   . Heart murmur    Hx: of as a child  . Hyperlipidemia   . Hypertension   . IBS (irritable bowel syndrome)    Hx; of  . Pinched nerve in neck   . Pneumonia    Hx:of a "touch' of PNA  . Seasonal allergies    Hx: of  . thyroid problem     Past Surgical History:  Procedure Laterality Date  . ABLATION     Hx: of  . ANTERIOR CERVICAL DECOMPRESSION/DISCECTOMY FUSION 4 LEVELS N/A 12/25/2012   Procedure: ANTERIOR CERVICAL DECOMPRESSION/DISCECTOMY FUSION 4 LEVELS;  Surgeon: Sinclair Ship, MD;  Location: [facility];  Service: Orthopedics;  Laterality: N/A;  Anterior cervical decompression fusion, cervical 4-5, cervical 5-6, cervical 6-7, cervical 7-thoracic 1 with  instrumentation, allograft  . APPENDECTOMY    . CARDIAC CATHETERIZATION    . CHOLECYSTECTOMY    . CHOLECYSTECTOMY, LAPAROSCOPIC  07/13/2010  . CYST EXCISION     Hx: of several  . EYE SURGERY     Hx: of cyst removed from left eye  . FINE NEEDLE ASPIRATION     Hx: of right breast  . TONSILLECTOMY      Social History   Social History  . Marital status: Married    Spouse name: [name]   . Number of children: 2  . Years of education: HS   Occupational History  . Retired.     Social History Main Topics  . Smoking status: Former Smoker    Packs/day: 1.00    Years: 50.00    Types: Cigarettes    Quit date: 12/14/2015  . Smokeless tobacco: Never Used  . Alcohol use Yes     Comment: " occasional"  . Drug use: No  . Sexual activity: Not Currently   Other Topics Concern  . Not on file   Social History Narrative   10 caffeinated drinks daily. No regular exercise. She recently moved here from [place]  she now lives with her husband, daughter, granddaughter, son and sometimes grandson.    Family History  Problem Relation Age of Onset  . Lymphoma Father   . Aortic aneurysm Mother     died from/Deceased  . Other Mother     Smoking  . Diabetes Maternal Grandmother   . Stroke Maternal Grandmother   . Stroke Maternal Uncle   . Heart attack      Mothers family    Outpatient Encounter Prescriptions as of 03/28/2016  Medication Sig  . albuterol (PROVENTIL) (2.5 MG/3ML) 0.083% nebulizer solution Take 3 mLs (2.5 mg total) by nebulization every 6 (six) hours as needed for wheezing or shortness of breath.  Marland Kitchen buPROPion (ZYBAN) 150 MG 12 hr tablet Take 150 mg by mouth.  . butalbital-acetaminophen-caffeine (FIORICET, ESGIC) 50-325-40 MG tablet   . Loratadine (CLARITIN PO) Take by mouth as needed.  . methimazole (TAPAZOLE) 5 MG tablet TAKE 1 TABLET(5 MG) BY MOUTH DAILY  . omeprazole (PRILOSEC) 40 MG capsule Take 40 mg by mouth daily.  . metoprolol succinate (TOPROL-XL) 25 MG 24 hr tablet Take 1 tablet (25 mg total) by mouth daily.  . valACYclovir (VALTREX) 1000 MG tablet Take 1 tablet (1,000 mg total) by mouth 2 (two) times daily.   No facility-administered encounter medications on file as of 03/28/2016.          Objective:   Physical Exam  Constitutional: She is oriented to person, place, and time. She appears well-developed and well-nourished.  HENT:  Head: Normocephalic and atraumatic.  On the upper lip there is just some mild swelling of the tissue. No induration. It's very tender to touch on the inside and outside. On the inner portion of the upper lip there is some erythema but no ulcerative lesions.  Eyes: Conjunctivae and EOM are normal.  Cardiovascular: Normal rate.   Pulmonary/Chest: Effort normal.  Neurological: She is alert and oriented to person, place, and time.  Skin: Skin is dry. No pallor.  Psychiatric: She has a normal mood and affect. Her behavior is normal.  Vitals  reviewed.         Assessment & Plan:  Lip swelling/tingling could certainly be a repeat herpes infections we'll go ahead and place her on valacyclovir. She does have some erythema on the inside of  the lip and it does appear to be mildly swollen but no significant erythema or actual ulcerative lesions.  I did encourage her to call me if the condition worsens or she develops a fever or starts to see ulcerative lesions.  Hypertension-her blood pressures actually been low but elevated the last couple times she's been here so would like her to restart her metoprolol but will start with 25 mg extended release once a day.   Tremor consistent with benign ESSential tremor. I do think that the Wellbutrin could be exacerbating her symptoms but the tremor did start before she started the medication. Hopefully will be able to wean off of the Wellbutrin in the spring and we can see if this helps. We could also consider starting medication, but this would be a commitment to take a pill every day. Explained to patient that this would not cure the tremor but which is help alleviate her symptoms while she is taking the medication. Could consider propranolol. In fact we could actually use that in place of her metoprolol which would help lower her blood pressure as well as potentially help her tremor.   LACUNAR infarcts-recommend that she start taking a daily aspirin. She started about 2 days ago. I will just double check with her oncologist to make sure that this is safe with her current treatment regimen.

## 2016-03-28 NOTE — Telephone Encounter (Signed)
Call patient: See if she is already picked up the prescription for the metoprolol. If she has been go ahead and start it. It may actually help her tremor. If she has not picked it up and we can switch the metoprolol to propranolol which is a similar type of medication for blood pressure but also has the effect of helping her tremor even more.

## 2016-03-29 NOTE — Telephone Encounter (Signed)
Informed pt's husband of recommendations. He will relay information to his wife.Laura Ellison [place]

## 2016-04-02 DIAGNOSIS — I10 Essential (primary) hypertension: Secondary | ICD-10-CM | POA: Diagnosis not present

## 2016-04-02 DIAGNOSIS — K219 Gastro-esophageal reflux disease without esophagitis: Secondary | ICD-10-CM | POA: Diagnosis not present

## 2016-04-02 DIAGNOSIS — E0590 Thyrotoxicosis, unspecified without thyrotoxic crisis or storm: Secondary | ICD-10-CM | POA: Diagnosis not present

## 2016-04-02 DIAGNOSIS — C787 Secondary malignant neoplasm of liver and intrahepatic bile duct: Secondary | ICD-10-CM | POA: Diagnosis not present

## 2016-04-02 DIAGNOSIS — C160 Malignant neoplasm of cardia: Secondary | ICD-10-CM | POA: Diagnosis not present

## 2016-04-02 DIAGNOSIS — E785 Hyperlipidemia, unspecified: Secondary | ICD-10-CM | POA: Diagnosis not present

## 2016-04-02 DIAGNOSIS — E559 Vitamin D deficiency, unspecified: Secondary | ICD-10-CM | POA: Diagnosis not present

## 2016-04-02 DIAGNOSIS — Z6827 Body mass index (BMI) 27.0-27.9, adult: Secondary | ICD-10-CM | POA: Diagnosis not present

## 2016-04-02 DIAGNOSIS — J449 Chronic obstructive pulmonary disease, unspecified: Secondary | ICD-10-CM | POA: Diagnosis not present

## 2016-04-02 DIAGNOSIS — R69 Illness, unspecified: Secondary | ICD-10-CM | POA: Diagnosis not present

## 2016-04-04 ENCOUNTER — Ambulatory Visit (INDEPENDENT_AMBULATORY_CARE_PROVIDER_SITE_OTHER): Payer: Medicare HMO | Admitting: Sports Medicine

## 2016-04-04 ENCOUNTER — Encounter: Payer: Self-pay | Admitting: Sports Medicine

## 2016-04-04 DIAGNOSIS — M7061 Trochanteric bursitis, right hip: Secondary | ICD-10-CM | POA: Diagnosis not present

## 2016-04-04 DIAGNOSIS — M47816 Spondylosis without myelopathy or radiculopathy, lumbar region: Secondary | ICD-10-CM | POA: Insufficient documentation

## 2016-04-04 DIAGNOSIS — M4696 Unspecified inflammatory spondylopathy, lumbar region: Secondary | ICD-10-CM | POA: Diagnosis not present

## 2016-04-04 MED ORDER — DICLOFENAC SODIUM 75 MG PO TBEC: 75.0000 mg | DELAYED_RELEASE_TABLET | Freq: Two times a day (BID) | ORAL | 3 refills | Status: DC

## 2016-04-04 NOTE — Assessment & Plan Note (Signed)
Starting Voltaren, formal physical therapy. Patient declines injection, she will return in one month, injection if no better. There was minimal osteoarthritis on her hip x-ray. On a literature search, it appears that NSAIDs are not contraindicated with gastroesophageal junction adenocarcinoma in the absence of active bleeding, and in fact use is associated with a lower risk.

## 2016-04-04 NOTE — Progress Notes (Signed)
   Subjective:    I'm seeing this patient as a consultation for:  Dr. Beatrice Lecher  CC: Right hip pain  HPI: For several months this pleasant 71 year old female has had pain that she localizes on the lateral aspect of her right hip, no radiation, worse with lying on the ipsilateral side at night, really has no pain in the groin. She does have a bit of pain in the posterior low back on the right side. Symptoms are moderate, persistent. Declines interventional treatment today.  Past medical history:  Negative.  See flowsheet/record as well for more information.  Surgical history: Negative.  See flowsheet/record as well for more information.  Family history: Negative.  See flowsheet/record as well for more information.  Social history: Negative.  See flowsheet/record as well for more information.  Allergies, and medications have been entered into the medical record, reviewed, and no changes needed.   Review of Systems: No headache, visual changes, nausea, vomiting, diarrhea, constipation, dizziness, abdominal pain, skin rash, fevers, chills, night sweats, weight loss, swollen lymph nodes, body aches, joint swelling, muscle aches, chest pain, shortness of breath, mood changes, visual or auditory hallucinations.   Objective:   General: Well Developed, well nourished, and in no acute distress.  Neuro/Psych: Alert and oriented x3, extra-ocular muscles intact, able to move all 4 extremities, sensation grossly intact. Skin: Warm and dry, no rashes noted.  Respiratory: Not using accessory muscles, speaking in full sentences, trachea midline.  Cardiovascular: Pulses palpable, no extremity edema. Abdomen: Does not appear distended. Right Hip: ROM IR: 60 Deg, ER: 60 Deg, Flexion: 120 Deg, Extension: 100 Deg, Abduction: 45 Deg, Adduction: 45 Deg Strength IR: 5/5, ER: 5/5, Flexion: 5/5, Extension: 5/5, Abduction: 4-/5, Adduction: 5/5 Pelvic alignment unremarkable to inspection and  palpation. Standing hip rotation and gait without trendelenburg / unsteadiness. Greater trochanter with tenderness to palpation. No tenderness over piriformis. No SI joint tenderness and normal minimal SI movement.  Impression and Recommendations:   This case required medical decision making of moderate complexity.  Trochanteric bursitis, right hip Starting Voltaren, formal physical therapy. Patient declines injection, she will return in one month, injection if no better. There was minimal osteoarthritis on her hip x-ray. On a literature search, it appears that NSAIDs are not contraindicated with gastroesophageal junction adenocarcinoma in the absence of active bleeding, and in fact use is associated with a lower risk.  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

## 2016-04-04 NOTE — Assessment & Plan Note (Signed)
Does have some back pain but we will treat her hip before using the right L5-S1 facet as an interventional target.

## 2016-04-05 MED ORDER — AZITHROMYCIN 250 MG PO TABS: ORAL_TABLET | ORAL | 0 refills | Status: AC

## 2016-04-05 NOTE — Telephone Encounter (Signed)
Pt advised.

## 2016-04-05 NOTE — Telephone Encounter (Signed)
Ok new rx sent for zpack.

## 2016-04-05 NOTE — Addendum Note (Signed)
Addended by: Beatrice Lecher D on: 04/05/2016 01:24 PM   Modules accepted: Orders

## 2016-04-05 NOTE — Telephone Encounter (Signed)
Pt states she was also supposed to get an antibiotic Rx at last OV with PCP? No Rx sent, do not see info in OV note. Routing for review. [facility] on file is correct pharmacy.

## 2016-04-06 DIAGNOSIS — R1310 Dysphagia, unspecified: Secondary | ICD-10-CM | POA: Diagnosis not present

## 2016-04-06 DIAGNOSIS — Z79899 Other long term (current) drug therapy: Secondary | ICD-10-CM | POA: Diagnosis not present

## 2016-04-06 DIAGNOSIS — C160 Malignant neoplasm of cardia: Secondary | ICD-10-CM | POA: Diagnosis not present

## 2016-04-06 DIAGNOSIS — M4802 Spinal stenosis, cervical region: Secondary | ICD-10-CM | POA: Diagnosis not present

## 2016-04-06 DIAGNOSIS — R970 Elevated carcinoembryonic antigen [CEA]: Secondary | ICD-10-CM | POA: Diagnosis not present

## 2016-04-06 DIAGNOSIS — I10 Essential (primary) hypertension: Secondary | ICD-10-CM | POA: Diagnosis not present

## 2016-04-06 DIAGNOSIS — J449 Chronic obstructive pulmonary disease, unspecified: Secondary | ICD-10-CM | POA: Diagnosis not present

## 2016-04-06 DIAGNOSIS — M5137 Other intervertebral disc degeneration, lumbosacral region: Secondary | ICD-10-CM | POA: Diagnosis not present

## 2016-04-06 DIAGNOSIS — R69 Illness, unspecified: Secondary | ICD-10-CM | POA: Diagnosis not present

## 2016-04-06 DIAGNOSIS — Z5181 Encounter for therapeutic drug level monitoring: Secondary | ICD-10-CM | POA: Diagnosis not present

## 2016-04-06 DIAGNOSIS — Z7189 Other specified counseling: Secondary | ICD-10-CM | POA: Diagnosis not present

## 2016-04-06 DIAGNOSIS — Z5111 Encounter for antineoplastic chemotherapy: Secondary | ICD-10-CM | POA: Diagnosis not present

## 2016-04-06 DIAGNOSIS — Z87891 Personal history of nicotine dependence: Secondary | ICD-10-CM | POA: Diagnosis not present

## 2016-04-06 DIAGNOSIS — C786 Secondary malignant neoplasm of retroperitoneum and peritoneum: Secondary | ICD-10-CM | POA: Diagnosis not present

## 2016-04-06 DIAGNOSIS — C787 Secondary malignant neoplasm of liver and intrahepatic bile duct: Secondary | ICD-10-CM | POA: Diagnosis not present

## 2016-04-09 ENCOUNTER — Ambulatory Visit (INDEPENDENT_AMBULATORY_CARE_PROVIDER_SITE_OTHER): Payer: Medicare HMO | Admitting: Physical Therapy

## 2016-04-09 DIAGNOSIS — M6281 Muscle weakness (generalized): Secondary | ICD-10-CM

## 2016-04-09 DIAGNOSIS — M25551 Pain in right hip: Secondary | ICD-10-CM

## 2016-04-09 DIAGNOSIS — M25651 Stiffness of right hip, not elsewhere classified: Secondary | ICD-10-CM | POA: Diagnosis not present

## 2016-04-09 NOTE — Patient Instructions (Addendum)
  Start using ice on the outside of the Rt hip, 15-20 min a couple times a day.    Quads / HF, Prone    Lie face down, knees together. Grasp one ankle with same-side hand. Use towel if needed to reach. Gently pull foot toward buttock. Hold _45__ seconds. Repeat _2__ times per session. Do _1__ sessions per day.  Stretching: Piriformis (Supine)    Pull right knee toward opposite shoulder. Hold __45__ seconds. Relax. Repeat _2___ times per set. Do __1__ sets per session. Do __1__ sessions per day.  Strengthening: Hip Abductor - Resisted - can lie flat    With band looped around both legs above knees, push thighs apart. Use yellow band.  Repeat _10___ times per set. Do __3__ sets per session. Do ___1_ sessions per day. Copyright  [employer]. All rights reserved.

## 2016-04-09 NOTE — Therapy (Signed)
Laura Ellison, [place], [address] Phone: [phone]   Fax:  [phone]  Physical Therapy Evaluation  Patient Details  Name: Laura Ellison MRN: [id number] Date of Birth: 1945/03/01 Referring Provider: Dr Dianah Field  Encounter Date: 04/09/2016      PT End of Session - 04/09/16 1136    Visit Number 1   Number of Visits 12   Date for PT Re-Evaluation 05/21/16   PT Start Time [id number]   PT Stop Time 1224   PT Time Calculation (min) 48 min   Activity Tolerance Patient tolerated treatment well      Past Medical History:  Diagnosis Date  . Anemia    Hx: of as a teen  . Anxiety    Hx; of situational anxiety  . Arthritis   . Dysrhythmia    Hx; of palpitations in the past  . Emphysema   . Family history of anesthesia complication    "son had a problem waking up"  . GERD (gastroesophageal reflux disease)   . Headache(784.0)   . Heart murmur    Hx: of as a child  . Hyperlipidemia   . Hypertension   . IBS (irritable bowel syndrome)    Hx; of  . Pinched nerve in neck   . Pneumonia    Hx:of a "touch' of PNA  . Seasonal allergies    Hx: of  . thyroid problem     Past Surgical History:  Procedure Laterality Date  . ABLATION     Hx: of  . ANTERIOR CERVICAL DECOMPRESSION/DISCECTOMY FUSION 4 LEVELS N/A 12/25/2012   Procedure: ANTERIOR CERVICAL DECOMPRESSION/DISCECTOMY FUSION 4 LEVELS;  Surgeon: Sinclair Ship, MD;  Location: [facility];  Service: Orthopedics;  Laterality: N/A;  Anterior cervical decompression fusion, cervical 4-5, cervical 5-6, cervical 6-7, cervical 7-thoracic 1 with instrumentation, allograft  . APPENDECTOMY    . CARDIAC CATHETERIZATION    . CHOLECYSTECTOMY    . CHOLECYSTECTOMY, LAPAROSCOPIC  07/13/2010  . CYST EXCISION     Hx: of several  . EYE SURGERY     Hx: of cyst removed from left eye  . FINE NEEDLE ASPIRATION     Hx: of right breast  . TONSILLECTOMY      There  were no vitals filed for this visit.       Subjective Assessment - 04/09/16 1136    Subjective Laura Ellison reports that her Rt hip started bothering her last summer, very mild and she thought it would go away. Around Thanksgiving she decided she couldn't deal with it anymore however also found out that her cancer is back so she had to put the hip on hold. 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  Having infustion of octiva every two weeks for this for 8 treatments, has had 6 so far.     How long can you sit comfortably? no limitations however occassionally will be uncomfortable in the lower spine.    How long can you walk comfortably? unable to walk stairs due to back and hip issues.    Patient Stated Goals consistently tolerate standing for meal prep,    Currently in Pain? Yes   Pain Score 1    Pain Location Hip   Pain Orientation Right   Pain Descriptors / Indicators Aching   Pain Type Chronic pain   Pain Onset More than a month ago   Pain Frequency Intermittent   Aggravating Factors  weather makes it change, trying to lower leg off stool, lying on the Rt side, rolling.    Pain Relieving Factors hasn't found anything yet.             [name] PT Assessment - 04/09/16 0001      Assessment   Medical Diagnosis Rt hip bursitis   Referring Provider Dr Dianah Field   Onset Date/Surgical Date 09/10/15   Next MD Visit 04/24/16   Prior Therapy not for this     Precautions   Precautions Other (comment)   Precaution Comments under going treatment for cancer     Balance Screen   Has the patient fallen in the past 6 months No     Prior Function   Level of Independence Independent   Vocation Retired   Leisure sedentary right now.      Observation/Other Assessments   Focus on Therapeutic Outcomes (FOTO)  58% limited     Functional Tests   Functional tests Squat     Squat   Comments pain Rt hip with initiation of squat     Posture/Postural  Control   Posture/Postural Control Postural limitations   Postural Limitations Forward head;Rounded Shoulders;Increased thoracic kyphosis  Lt shoulder complex elevated     ROM / Strength   AROM / PROM / Strength AROM;Strength     AROM   AROM Assessment Site Lumbar   Lumbar Flexion WNL, some pulling Rt hamstring   Lumbar Extension WNL   Lumbar - Right Side Bend WNL, pulling on Rt side.    Lumbar - Left Side Bend WNL   Lumbar - Right Rotation pulling on side, WNL   Lumbar - Left Rotation WNL     Strength   Strength Assessment Site Hip;Knee;Ankle;Lumbar   Right/Left Hip Right;Left   Right Hip Flexion 4-/5   Right Hip Extension 3-/5  with pain   Right Hip ABduction 3+/5  with pain   Left Hip Flexion 5/5   Left Hip Extension 5/5   Left Hip ABduction 4-/5   Right/Left Knee --  WNL, except Rt hamstring 4+/5   Right/Left Ankle --  bilat WNL     Flexibility   Soft Tissue Assessment /Muscle Length yes   Hamstrings bilat WNL   Quadriceps prone knee flex Rt 122, Lt 131     Palpation   Palpation comment pain in the Rt lateral gluts, piriformis and posterior greater troch                   [facility] Adult PT Treatment/Exercise - 04/09/16 0001      Exercises   Exercises Knee/Hip     Knee/Hip Exercises: Stretches   Sports administrator Both;1 rep  45 sec prone with strap   Piriformis Stretch Both;1 rep  45 sec, knee to opposite shoulder supine.      Modalities   Modalities Cryotherapy     Cryotherapy   Number Minutes Cryotherapy 15 Minutes   Cryotherapy Location Hip  right   Type of Cryotherapy Ice pack                PT Education - 04/09/16 1203    Education provided Yes   Education Details HEP    Person(s) Educated Patient   Methods Explanation;Demonstration;Handout   Comprehension Returned demonstration;Verbalized understanding             PT Long Term Goals - 04/09/16 1133      PT LONG TERM GOAL #1   Title I with advanced  HEP ( 05/21/16)    Time 6   Period Weeks   Status New     PT LONG TERM GOAL #2   Title report =/> 75% reduction in Rt hip pain ( 05/21/16)    Time 6   Period Weeks   Status New     PT LONG TERM GOAL #3   Title improve FOTO =/< 46% limited CK level ( 05/21/16)    Time 6   Period Weeks   Status New     PT LONG TERM GOAL #4   Title demo bilat hip strength =/> 4+/5 with out pain to allow her to not fatigue with household ambulation ( 05/21/16)    Time 6   Period Weeks   Status New     PT LONG TERM GOAL #5   Title demo bilat prone quad flexibility =/> 135 degrees without pain ( 05/21/16)    Time 6   Period Weeks   Status New               Plan - 04/09/16 1227    Clinical Impression Statement 71 yo female presents for moderate complexity PT eval for Rt hip bursitis.  She was recently informed that her cancer has returned and is undergoing obvida infusions every two weeks for 8 treatments and has had 6 to date.  She has weakness in the Rt hip, pain with movement and tenderness to palpation.  She also has tenderness in the Rt gluts and piriformis.  She has a h/o low back issues and cervical fusion in 2014.  A message was left with her oncologist to see if it is ok to use dexamethasone with her CA dx. Progress is expected to be slower than normal due to co morbidities.     Rehab Potential Good   Clinical Impairments Affecting Rehab Potential reoccurrence of CA, h/o back issues, deconditioning.    PT Frequency 2x / week   PT Duration 6 weeks   PT Treatment/Interventions Moist Heat;Therapeutic exercise;Dry needling;Taping;Vasopneumatic Device;Manual techniques;Neuromuscular re-education;Cryotherapy;Iontophoresis 4mg /ml Dexamethasone;Patient/family education   PT Next Visit Plan manual work to Rt buttocks, TFL, gentle eccentric hip strengthening, ice.  Ionotphoresis if oncologists reports it is OK.   Consulted and Agree with Plan of Care Patient      Patient will benefit from skilled therapeutic  intervention in order to improve the following deficits and impairments:  Decreased strength, Increased edema, Pain, Impaired flexibility, Increased muscle spasms, Decreased endurance  Visit Diagnosis: Pain in right hip - Plan: PT plan of care cert/re-cert  Muscle weakness (generalized) - Plan: PT plan of care cert/re-cert  Stiffness of right hip, not elsewhere classified - Plan: PT plan of care cert/re-cert     Problem List Patient Active Problem List   Diagnosis Date Noted  . Trochanteric bursitis, right hip 04/04/2016  . 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 History of cerebral infarction 03/28/2016  . Right shoulder pain 04/21/2014  . 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 ECU (extensor carpi ulnaris), tendonitis 08/05/2012  . Spinal stenosis in cervical region 07/09/2012  . 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 Thyromegaly 12/06/2010  . Emphysema lung ([place]) 12/06/2010  . Tobacco abuse 11/10/2010  . Insulin resistance 11/10/2010  . COPD (chronic obstructive pulmonary disease) ([place]) 11/09/2010  . Graves disease 11/09/2010  . Microscopic hematuria 11/09/2010  . Essential hypertension, benign 11/09/2010  . Other and unspecified hyperlipidemia 11/09/2010  Jeral Pinch PT  04/09/2016, 12:36 PM  [facility] [place], [place], [address] Phone: [phone]   Fax:  [phone]  Name: Laura Ellison MRN: [id number] Date of Birth: [birth year]

## 2016-04-12 ENCOUNTER — Ambulatory Visit (INDEPENDENT_AMBULATORY_CARE_PROVIDER_SITE_OTHER): Payer: Medicare HMO | Admitting: Physical Therapy

## 2016-04-12 DIAGNOSIS — M25551 Pain in right hip: Secondary | ICD-10-CM | POA: Diagnosis not present

## 2016-04-12 DIAGNOSIS — M6281 Muscle weakness (generalized): Secondary | ICD-10-CM

## 2016-04-12 DIAGNOSIS — M25651 Stiffness of right hip, not elsewhere classified: Secondary | ICD-10-CM

## 2016-04-12 NOTE — Therapy (Signed)
[place] [address] [place], [place], [address] Phone: [phone]   Fax:  [phone]  Physical Therapy Treatment  Patient Details  Name: Laura Ellison MRN: [id number] Date of Birth: 01/09/1946 Referring Provider: Dr Dianah Field  Encounter Date: 04/12/2016      PT End of Session - 04/12/16 0928    Visit Number 2   Number of Visits 12   Date for PT Re-Evaluation 05/21/16   PT Start Time 0845   PT Stop Time 0925   PT Time Calculation (min) 40 min   Activity Tolerance Patient tolerated treatment well   Behavior During Therapy [facility] for tasks assessed/performed      Past Medical History:  Diagnosis Date  . Anemia    Hx: of as a teen  . Anxiety    Hx; of situational anxiety  . Arthritis   . Dysrhythmia    Hx; of palpitations in the past  . Emphysema   . Family history of anesthesia complication    "son had a problem waking up"  . GERD (gastroesophageal reflux disease)   . Headache(784.0)   . Heart murmur    Hx: of as a child  . Hyperlipidemia   . Hypertension   . IBS (irritable bowel syndrome)    Hx; of  . Pinched nerve in neck   . Pneumonia    Hx:of a "touch' of PNA  . Seasonal allergies    Hx: of  . thyroid problem     Past Surgical History:  Procedure Laterality Date  . ABLATION     Hx: of  . ANTERIOR CERVICAL DECOMPRESSION/DISCECTOMY FUSION 4 LEVELS N/A 12/25/2012   Procedure: ANTERIOR CERVICAL DECOMPRESSION/DISCECTOMY FUSION 4 LEVELS;  Surgeon: Sinclair Ship, MD;  Location: [place];  Service: Orthopedics;  Laterality: N/A;  Anterior cervical decompression fusion, cervical 4-5, cervical 5-6, cervical 6-7, cervical 7-thoracic 1 with instrumentation, allograft  . APPENDECTOMY    . CARDIAC CATHETERIZATION    . CHOLECYSTECTOMY    . CHOLECYSTECTOMY, LAPAROSCOPIC  07/13/2010  . CYST EXCISION     Hx: of several  . EYE SURGERY     Hx: of cyst removed from left eye  . FINE NEEDLE  ASPIRATION     Hx: of right breast  . TONSILLECTOMY      There were no vitals filed for this visit.      Subjective Assessment - 04/12/16 0847    Subjective "not having a good day."  pain in hip and buttocks.   Pertinent History cervical fusion 2014, gastroesphogeal CA   How long can you sit comfortably? no limitations however occassionally will be uncomfortable in the lower spine.    How long can you walk comfortably? unable to walk stairs due to back and hip issues.    Patient Stated Goals consistently tolerate standing for meal prep,    Currently in Pain? Yes   Pain Score 3    Pain Location Hip  groin and buttock   Pain Orientation Right   Pain Descriptors / Indicators Dull  pinching   Pain Type Chronic pain   Pain Onset More than a month ago   Pain Frequency Intermittent   Aggravating Factors  weather, lowering leg off stool, lying on Rt side, rolling   Pain Relieving Factors unknown                         [facility] Adult PT Treatment/Exercise - 04/12/16 4854  Knee/Hip Exercises: Stretches   Sports administrator Right;3 reps;30 seconds   Sports administrator Limitations prone with strap   Piriformis Stretch Both;3 reps;30 seconds   Piriformis Stretch Limitations knee to opposite shoulder     Knee/Hip Exercises: Aerobic   Nustep L5 x 6 min     Knee/Hip Exercises: Supine   Other Supine Knee/Hip Exercises clamshells with yellow theraband 3x10     Modalities   Modalities Iontophoresis     Iontophoresis   Type of Iontophoresis Dexamethasone   Location R hip   Dose 1.0 mL   Time 6 hour patch     Manual Therapy   Manual Therapy Soft tissue mobilization   Soft tissue mobilization R hip and buttocks with ball; educated in self mobilization with ball at home                PT Education - 04/12/16 0928    Education provided Yes   Education Details ionto   Person(s) Educated Patient   Methods Explanation   Comprehension Verbalized understanding              PT Long Term Goals - 04/09/16 1133      PT LONG TERM GOAL #1   Title I with advanced HEP ( 05/21/16)   Time 6   Period Weeks   Status New     PT LONG TERM GOAL #2   Title report =/> 75% reduction in Rt hip pain ( 05/21/16)    Time 6   Period Weeks   Status New     PT LONG TERM GOAL #3   Title improve FOTO =/< 46% limited CK level ( 05/21/16)    Time 6   Period Weeks   Status New     PT LONG TERM GOAL #4   Title demo bilat hip strength =/> 4+/5 with out pain to allow her to not fatigue with household ambulation ( 05/21/16)    Time 6   Period Weeks   Status New     PT LONG TERM GOAL #5   Title demo bilat prone quad flexibility =/> 135 degrees without pain ( 05/21/16)    Time 6   Period Weeks   Status New               Plan - 04/12/16 0272    Clinical Impression Statement Pt tolerated session well and demonstrated independence with initial HEP.  No new exercises given today as pain slightly increased today before session.  Pt reported improved pain after session, with ionto patch applied today.  Will continue to benefit from PT to maximize function and decrease pain.   Clinical Impairments Affecting Rehab Potential reoccurrence of CA, h/o back issues, deconditioning.    PT Treatment/Interventions Moist Heat;Therapeutic exercise;Dry needling;Taping;Vasopneumatic Device;Manual techniques;Neuromuscular re-education;Cryotherapy;Iontophoresis 4mg /ml Dexamethasone;Patient/family education   PT Next Visit Plan assess response to ionto, add eccentric hip strengthening, manual to Rt buttocks and TFL   Consulted and Agree with Plan of Care Patient      Patient will benefit from skilled therapeutic intervention in order to improve the following deficits and impairments:  Decreased strength, Increased edema, Pain, Impaired flexibility, Increased muscle spasms, Decreased endurance  Visit Diagnosis: Pain in right hip  Muscle weakness (generalized)  Stiffness of  right hip, not elsewhere classified     Problem List Patient Active Problem List   Diagnosis Date Noted  . Trochanteric bursitis, right hip 04/04/2016  . 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  History of cerebral infarction 03/28/2016  . Right shoulder pain 04/21/2014  . Primary cancer of cardia of stomach ([place]) 04/10/2014  . ECU (extensor carpi ulnaris), tendonitis 08/05/2012  . Spinal stenosis in cervical region 07/09/2012  . 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 Thyromegaly 12/06/2010  . Emphysema lung ([place]) 12/06/2010  . Tobacco abuse 11/10/2010  . Insulin resistance 11/10/2010  . COPD (chronic obstructive pulmonary disease) ([place]) 11/09/2010  . Graves disease 11/09/2010  . Microscopic hematuria 11/09/2010  . Essential hypertension, benign 11/09/2010  . Other and unspecified hyperlipidemia 11/09/2010      Laureen Abrahams, PT, DPT 04/12/16 9:32 AM    [facility] [address] [place], [place], [address] Phone: [phone]   Fax:  [phone]  Name: GYPSY KELLOGG MRN: [id number] Date of Birth: 28-Aug-1945

## 2016-04-12 NOTE — Patient Instructions (Signed)

## 2016-04-17 ENCOUNTER — Encounter: Payer: Self-pay | Admitting: Physical Therapy

## 2016-04-19 ENCOUNTER — Ambulatory Visit (INDEPENDENT_AMBULATORY_CARE_PROVIDER_SITE_OTHER): Payer: Medicare HMO | Admitting: Physical Therapy

## 2016-04-19 DIAGNOSIS — M25651 Stiffness of right hip, not elsewhere classified: Secondary | ICD-10-CM | POA: Diagnosis not present

## 2016-04-19 DIAGNOSIS — M6281 Muscle weakness (generalized): Secondary | ICD-10-CM

## 2016-04-19 DIAGNOSIS — M25551 Pain in right hip: Secondary | ICD-10-CM

## 2016-04-19 NOTE — Patient Instructions (Signed)
  STAND IN A CORNER WITH A CHAIR IN FRONT OF YOU FOR SAFETY:  Feet Apart (Compliant Surface) Head Motion - Eyes Open    With eyes open, standing on compliant surface: _pillow or cushion__, feet shoulder width apart, move head slowly: up and down 10 times and side to side 10 times. Repeat __1-2__ times per session. Do __1-2__ sessions per day.  Copyright  VHI. All rights reserved.    Feet Apart, Varied Arm Positions - Eyes Closed    Stand with feet shoulder width apart and arms at your side. Close eyes and stand still. Hold __10__ seconds. Repeat __3-5__ times per session. Do __1-2__ sessions per day.  Copyright  VHI. All rights reserved.

## 2016-04-19 NOTE — Therapy (Signed)
[place] [place] [place], [place], [address] Phone: [phone]   Fax:  [phone]  Physical Therapy Treatment  Patient Details  Name: Laura Ellison MRN: [id number] Date of Birth: 06-05-1945 Referring Provider: Dr Dianah Field  Encounter Date: 04/19/2016      PT End of Session - 04/19/16 0843    Visit Number 3   Number of Visits 12   Date for PT Re-Evaluation 05/21/16   PT Start Time 0801   PT Stop Time 0843   PT Time Calculation (min) 42 min   Activity Tolerance Patient limited by pain   Behavior During Therapy [facility] for tasks assessed/performed      Past Medical History:  Diagnosis Date  . Anemia    Hx: of as a teen  . Anxiety    Hx; of situational anxiety  . Arthritis   . Dysrhythmia    Hx; of palpitations in the past  . Emphysema   . Family history of anesthesia complication    "son had a problem waking up"  . GERD (gastroesophageal reflux disease)   . Headache(784.0)   . Heart murmur    Hx: of as a child  . Hyperlipidemia   . Hypertension   . IBS (irritable bowel syndrome)    Hx; of  . Pinched nerve in neck   . Pneumonia    Hx:of a "touch' of PNA  . Seasonal allergies    Hx: of  . thyroid problem     Past Surgical History:  Procedure Laterality Date  . ABLATION     Hx: of  . ANTERIOR CERVICAL DECOMPRESSION/DISCECTOMY FUSION 4 LEVELS N/A 12/25/2012   Procedure: ANTERIOR CERVICAL DECOMPRESSION/DISCECTOMY FUSION 4 LEVELS;  Surgeon: Sinclair Ship, MD;  Location: [facility];  Service: Orthopedics;  Laterality: N/A;  Anterior cervical decompression fusion, cervical 4-5, cervical 5-6, cervical 6-7, cervical 7-thoracic 1 with instrumentation, allograft  . APPENDECTOMY    . CARDIAC CATHETERIZATION    . CHOLECYSTECTOMY    . CHOLECYSTECTOMY, LAPAROSCOPIC  07/13/2010  . CYST EXCISION     Hx: of several  . EYE SURGERY     Hx: of cyst removed from left eye  . FINE NEEDLE ASPIRATION     Hx: of right breast  . TONSILLECTOMY      There were no vitals filed for this visit.      Subjective Assessment - 04/19/16 0805    Subjective pt felt great after last session but by noon next day was back to having pain.  reports she almost fell this morning tripping on pant leg.   Pertinent History cervical fusion 2014, gastroesphogeal CA   How long can you sit comfortably? no limitations however occassionally will be uncomfortable in the lower spine.    How long can you walk comfortably? unable to walk stairs due to back and hip issues.    Patient Stated Goals consistently tolerate standing for meal prep,    Currently in Pain? Yes   Pain Score 8    Pain Location Hip   Pain Orientation Right   Pain Descriptors / Indicators Dull   Pain Type Chronic pain   Pain Onset More than a month ago   Pain Frequency Intermittent   Aggravating Factors  weather, lowering leg, lying on Rt side, rolling   Pain Relieving Factors ionto                         [facility] Adult  PT Treatment/Exercise - 04/19/16 0811      Knee/Hip Exercises: Stretches   Other Knee/Hip Stretches therapist performed hamstring, piriformis and hip flexor/quad stretch 3 reps x 30 sec each with min improvement in pain     Knee/Hip Exercises: Aerobic   Nustep L5 x 7 min     Iontophoresis   Type of Iontophoresis Dexamethasone   Location R hip   Dose 1.0 mL   Time 6 hour patch     Manual Therapy   Manual Therapy Joint mobilization   Joint Mobilization R hip long axis distraction 10 x 10 sec; performed x2 with pt reporting relief in symptoms             Balance Exercises - 04/19/16 0844      Balance Exercises: Standing   Standing Eyes Opened Foam/compliant surface;Wide (BOA);Head turns   Standing Eyes Closed Solid surface;Wide (BOA);3 reps;10 secs           PT Education - 04/19/16 0842    Education provided Yes   Education Details balance HEP   Person(s) Educated Patient   Methods  Explanation;Handout;Demonstration   Comprehension Verbalized understanding;Returned demonstration;Need further instruction             PT Long Term Goals - 04/19/16 1242      PT LONG TERM GOAL #1   Title I with advanced HEP ( 05/21/16)   Status On-going     PT LONG TERM GOAL #2   Title report =/> 75% reduction in Rt hip pain ( 05/21/16)    Status On-going     PT LONG TERM GOAL #3   Title improve FOTO =/< 46% limited CK level ( 05/21/16)    Status On-going     PT LONG TERM GOAL #4   Title demo bilat hip strength =/> 4+/5 with out pain to allow her to not fatigue with household ambulation ( 05/21/16)    Status On-going     PT LONG TERM GOAL #5   Title demo bilat prone quad flexibility =/> 135 degrees without pain ( 05/21/16)    Status On-going               Plan - 04/19/16 1242    Clinical Impression Statement Pt reports near fall this morning and increased pain in R hip after catching herself from falling.  Gentle manual today with relief only with long axis distraction of R hip.  Pt also reports increased balance deficits especially with head turns.  Initiated corner balance exercises today to help decrease fall risk and decrease risk of reinjury.  Unable to stand on compliant surface with EC indicating decreased vestibular input for balance.   PT Treatment/Interventions Moist Heat;Therapeutic exercise;Dry needling;Taping;Vasopneumatic Device;Manual techniques;Neuromuscular re-education;Cryotherapy;Iontophoresis 4mg /ml Dexamethasone;Patient/family education   PT Next Visit Plan assess response to ionto, add eccentric hip strengthening, manual to Rt buttocks and TFL, review balance HEP (progress PRN), continue exercises as tolerated   Consulted and Agree with Plan of Care Patient      Patient will benefit from skilled therapeutic intervention in order to improve the following deficits and impairments:  Decreased strength, Increased edema, Pain, Impaired flexibility,  Increased muscle spasms, Decreased endurance  Visit Diagnosis: Pain in right hip  Muscle weakness (generalized)  Stiffness of right hip, not elsewhere classified     Problem List Patient Active Problem List   Diagnosis Date Noted  . Trochanteric bursitis, right hip 04/04/2016  . 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  History of cerebral infarction 03/28/2016  . Right shoulder pain 04/21/2014  . Primary cancer of cardia of stomach ([place]) 04/10/2014  . ECU (extensor carpi ulnaris), tendonitis 08/05/2012  . Spinal stenosis in cervical region 07/09/2012  . 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 Thyromegaly 12/06/2010  . Emphysema lung ([place]) 12/06/2010  . Tobacco abuse 11/10/2010  . Insulin resistance 11/10/2010  . COPD (chronic obstructive pulmonary disease) ([place]) 11/09/2010  . Graves disease 11/09/2010  . Microscopic hematuria 11/09/2010  . Essential hypertension, benign 11/09/2010  . Other and unspecified hyperlipidemia 11/09/2010      Laureen Abrahams, PT, DPT 04/19/16 12:47 PM    [facility] [place] [address] [place], [place], [address] Phone: [phone]   Fax:  [phone]  Name: LILLIAM CHAMBLEE MRN: [id number] Date of Birth: 1945-05-05

## 2016-04-20 DIAGNOSIS — I10 Essential (primary) hypertension: Secondary | ICD-10-CM | POA: Diagnosis not present

## 2016-04-20 DIAGNOSIS — Z5181 Encounter for therapeutic drug level monitoring: Secondary | ICD-10-CM | POA: Diagnosis not present

## 2016-04-20 DIAGNOSIS — C160 Malignant neoplasm of cardia: Secondary | ICD-10-CM | POA: Diagnosis not present

## 2016-04-20 DIAGNOSIS — C787 Secondary malignant neoplasm of liver and intrahepatic bile duct: Secondary | ICD-10-CM | POA: Diagnosis not present

## 2016-04-20 DIAGNOSIS — Z923 Personal history of irradiation: Secondary | ICD-10-CM | POA: Diagnosis not present

## 2016-04-20 DIAGNOSIS — Z79899 Other long term (current) drug therapy: Secondary | ICD-10-CM | POA: Diagnosis not present

## 2016-04-20 DIAGNOSIS — R970 Elevated carcinoembryonic antigen [CEA]: Secondary | ICD-10-CM | POA: Diagnosis not present

## 2016-04-20 DIAGNOSIS — R1310 Dysphagia, unspecified: Secondary | ICD-10-CM | POA: Diagnosis not present

## 2016-04-20 DIAGNOSIS — Z87891 Personal history of nicotine dependence: Secondary | ICD-10-CM | POA: Diagnosis not present

## 2016-04-20 DIAGNOSIS — C786 Secondary malignant neoplasm of retroperitoneum and peritoneum: Secondary | ICD-10-CM | POA: Diagnosis not present

## 2016-04-24 ENCOUNTER — Ambulatory Visit (INDEPENDENT_AMBULATORY_CARE_PROVIDER_SITE_OTHER): Payer: Medicare HMO | Admitting: Physical Therapy

## 2016-04-24 DIAGNOSIS — M25651 Stiffness of right hip, not elsewhere classified: Secondary | ICD-10-CM | POA: Diagnosis not present

## 2016-04-24 DIAGNOSIS — M25551 Pain in right hip: Secondary | ICD-10-CM

## 2016-04-24 DIAGNOSIS — M6281 Muscle weakness (generalized): Secondary | ICD-10-CM

## 2016-04-24 NOTE — Therapy (Signed)
[place], [place], [address] Phone: [phone]   Fax:  [phone]  Physical Therapy Treatment  Patient Details  Name: Laura Ellison MRN: [id number] Date of Birth: [birth year] Referring Provider: Dr Dianah Field  Encounter Date: 04/24/2016      PT End of Session - 04/24/16 0852    Visit Number 4   Number of Visits 12   Date for PT Re-Evaluation 05/21/16   PT Start Time 0801   PT Stop Time 0846   PT Time Calculation (min) 45 min   Activity Tolerance Patient tolerated treatment well;No increased pain   Behavior During Therapy [facility] for tasks assessed/performed      Past Medical History:  Diagnosis Date  . Anemia    Hx: of as a teen  . Anxiety    Hx; of situational anxiety  . Arthritis   . Dysrhythmia    Hx; of palpitations in the past  . Emphysema   . Family history of anesthesia complication    "son had a problem waking up"  . GERD (gastroesophageal reflux disease)   . Headache(784.0)   . Heart murmur    Hx: of as a child  . Hyperlipidemia   . Hypertension   . IBS (irritable bowel syndrome)    Hx; of  . Pinched nerve in neck   . Pneumonia    Hx:of a "touch' of PNA  . Seasonal allergies    Hx: of  . thyroid problem     Past Surgical History:  Procedure Laterality Date  . ABLATION     Hx: of  . ANTERIOR CERVICAL DECOMPRESSION/DISCECTOMY FUSION 4 LEVELS N/A 12/25/2012   Procedure: ANTERIOR CERVICAL DECOMPRESSION/DISCECTOMY FUSION 4 LEVELS;  Surgeon: Sinclair Ship, MD;  Location: [facility];  Service: Orthopedics;  Laterality: N/A;  Anterior cervical decompression fusion, cervical 4-5, cervical 5-6, cervical 6-7, cervical 7-thoracic 1 with instrumentation, allograft  . APPENDECTOMY    . CARDIAC CATHETERIZATION    . CHOLECYSTECTOMY    . CHOLECYSTECTOMY, LAPAROSCOPIC  07/13/2010  . CYST EXCISION     Hx: of several  . EYE SURGERY     Hx: of cyst removed from left eye  .  FINE NEEDLE ASPIRATION     Hx: of right breast  . TONSILLECTOMY      There were no vitals filed for this visit.      Subjective Assessment - 04/24/16 0807    Subjective doing well this morning, pain is much better from when she started PT.  seems to be worse in the afternoon.  feels lightheaded and woozy with prolonged standing.   Pertinent History cervical fusion 2014, gastroesphogeal CA   Patient Stated Goals consistently tolerate standing for meal prep,    Currently in Pain? No/denies                Vestibular Assessment - 04/24/16 0812      Orthostatics   BP supine (x 5 minutes) 139/78   HR supine (x 5 minutes) 70   BP sitting 122/66   HR sitting 68   BP standing (after 1 minute) 124/70   HR standing (after 1 minute) 76   BP standing (after 3 minutes) 126/78   HR standing (after 3 minutes) 81                 [facility] Adult PT Treatment/Exercise - 04/24/16 0807      Knee/Hip Exercises: Aerobic   Nustep L5 x 8 min  Knee/Hip Exercises: Supine   Other Supine Knee/Hip Exercises single limb clamshell x 10 bil with red theraband   Other Supine Knee/Hip Exercises hip abduction RLE x 10     Knee/Hip Exercises: Sidelying   Clams RLE x 6 with red theraband; unable to perform more due to increased pain     Iontophoresis   Type of Iontophoresis Dexamethasone   Location R hip   Dose 1.0 mL   Time 6 hour patch     Manual Therapy   Manual Therapy Joint mobilization   Joint Mobilization R hip long axis distraction 10 x 10 sec; performed with pt reporting relief in symptoms                PT Education - 04/24/16 0851    Education provided Yes   Education Details orthostatic hypotension   Person(s) Educated Patient   Methods Explanation;Handout   Comprehension Verbalized understanding             PT Long Term Goals - 04/19/16 1242      PT LONG TERM GOAL #1   Title I with advanced HEP ( 05/21/16)   Status On-going     PT LONG TERM GOAL  #2   Title report =/> 75% reduction in Rt hip pain ( 05/21/16)    Status On-going     PT LONG TERM GOAL #3   Title improve FOTO =/< 46% limited CK level ( 05/21/16)    Status On-going     PT LONG TERM GOAL #4   Title demo bilat hip strength =/> 4+/5 with out pain to allow her to not fatigue with household ambulation ( 05/21/16)    Status On-going     PT LONG TERM GOAL #5   Title demo bilat prone quad flexibility =/> 135 degrees without pain ( 05/21/16)    Status On-going               Plan - 04/24/16 3300    Clinical Impression Statement Pt reporting improvement in Rt hip pain today and tolerated more exercises.  Complained of increased dizziness and lightheadedness with position changes so check orthostatics today.  Pt positive and instructed to follow up with MD about how to manage.  Will continue to benefit from PT to maximize function.  Relief with long axis distraction-pt to bring husband next session to educate how to perform.   Clinical Impairments Affecting Rehab Potential reoccurrence of CA, h/o back issues, deconditioning.    PT Treatment/Interventions Moist Heat;Therapeutic exercise;Dry needling;Taping;Vasopneumatic Device;Manual techniques;Neuromuscular re-education;Cryotherapy;Iontophoresis 4mg /ml Dexamethasone;Patient/family education   PT Next Visit Plan eccentric hip strengthening (try to add to HEP if able), manual to Rt buttocks and TFL, review balance HEP (progress PRN), continue exercises as tolerated   Consulted and Agree with Plan of Care Patient      Patient will benefit from skilled therapeutic intervention in order to improve the following deficits and impairments:  Decreased strength, Increased edema, Pain, Impaired flexibility, Increased muscle spasms, Decreased endurance  Visit Diagnosis: Pain in right hip  Muscle weakness (generalized)  Stiffness of right hip, not elsewhere classified     Problem List Patient Active Problem List   Diagnosis  Date Noted  . Trochanteric bursitis, right hip 04/04/2016  . 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 History of cerebral infarction 03/28/2016  . Right shoulder pain 04/21/2014  . Primary cancer of cardia of stomach ([place]) 04/10/2014  . ECU (extensor carpi ulnaris), tendonitis 08/05/2012  .  Spinal stenosis in cervical region 07/09/2012  . 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 Thyromegaly 12/06/2010  . Emphysema lung ([place]) 12/06/2010  . Tobacco abuse 11/10/2010  . Insulin resistance 11/10/2010  . COPD (chronic obstructive pulmonary disease) ([place]) 11/09/2010  . Graves disease 11/09/2010  . Microscopic hematuria 11/09/2010  . Essential hypertension, benign 11/09/2010  . Other and unspecified hyperlipidemia 11/09/2010      Laureen Abrahams, PT, DPT 04/24/16 8:55 AM    [facility] [place] [address] [place], [place], [address] Phone: [phone]   Fax:  [phone]  Name: ROSELAND BRAUN MRN: [id number] Date of Birth: Apr 24, 1945

## 2016-04-26 ENCOUNTER — Ambulatory Visit (INDEPENDENT_AMBULATORY_CARE_PROVIDER_SITE_OTHER): Payer: Medicare HMO | Admitting: Physical Therapy

## 2016-04-26 DIAGNOSIS — M6281 Muscle weakness (generalized): Secondary | ICD-10-CM | POA: Diagnosis not present

## 2016-04-26 DIAGNOSIS — M25651 Stiffness of right hip, not elsewhere classified: Secondary | ICD-10-CM

## 2016-04-26 DIAGNOSIS — M25551 Pain in right hip: Secondary | ICD-10-CM

## 2016-04-26 NOTE — Patient Instructions (Signed)

## 2016-04-26 NOTE — Therapy (Signed)
[place], [place], [address] Phone: [phone]   Fax:  [phone]  Physical Therapy Treatment  Patient Details  Name: Laura Ellison MRN: [id number] Date of Birth: Sep 16, 1945 Referring Provider: Dr Dianah Field  Encounter Date: 04/26/2016      PT End of Session - 04/26/16 0843    Visit Number 5   Number of Visits 12   Date for PT Re-Evaluation 05/21/16   PT Start Time 0801   PT Stop Time 3338   PT Time Calculation (min) 56 min   Activity Tolerance Patient limited by pain  with manual work      Past Medical History:  Diagnosis Date  . Anemia    Hx: of as a teen  . Anxiety    Hx; of situational anxiety  . Arthritis   . Dysrhythmia    Hx; of palpitations in the past  . Emphysema   . Family history of anesthesia complication    "son had a problem waking up"  . GERD (gastroesophageal reflux disease)   . Headache(784.0)   . Heart murmur    Hx: of as a child  . Hyperlipidemia   . Hypertension   . IBS (irritable bowel syndrome)    Hx; of  . Pinched nerve in neck   . Pneumonia    Hx:of a "touch' of PNA  . Seasonal allergies    Hx: of  . thyroid problem     Past Surgical History:  Procedure Laterality Date  . ABLATION     Hx: of  . ANTERIOR CERVICAL DECOMPRESSION/DISCECTOMY FUSION 4 LEVELS N/A 12/25/2012   Procedure: ANTERIOR CERVICAL DECOMPRESSION/DISCECTOMY FUSION 4 LEVELS;  Surgeon: Sinclair Ship, MD;  Location: [facility];  Service: Orthopedics;  Laterality: N/A;  Anterior cervical decompression fusion, cervical 4-5, cervical 5-6, cervical 6-7, cervical 7-thoracic 1 with instrumentation, allograft  . APPENDECTOMY    . CARDIAC CATHETERIZATION    . CHOLECYSTECTOMY    . CHOLECYSTECTOMY, LAPAROSCOPIC  07/13/2010  . CYST EXCISION     Hx: of several  . EYE SURGERY     Hx: of cyst removed from left eye  . FINE NEEDLE ASPIRATION     Hx: of right breast  . TONSILLECTOMY       There were no vitals filed for this visit.      Subjective Assessment - 04/26/16 0803    Subjective Pt reports her pain varies she is having more pain up into her low back and Rt buttock   Pertinent History cervical fusion 2014, gastroesphogeal CA   Patient Stated Goals consistently tolerate standing for meal prep,    Currently in Pain? Yes   Pain Score 4    Pain Location Hip   Pain Orientation Right   Pain Descriptors / Indicators Aching;Dull   Pain Type Chronic pain   Pain Radiating Towards up into the Rt buttock and low back   Pain Onset More than a month ago   Pain Frequency Intermittent   Aggravating Factors  stretching lying on her belly for quad, rolling and weather   Pain Relieving Factors ionto and ice                         [facility] Adult PT Treatment/Exercise - 04/26/16 0001      Exercises   Exercises Knee/Hip     Knee/Hip Exercises: Stretches   Other Knee/Hip Stretches Rt hip adductor stretch in half butterfly supine  2x45sec     Knee/Hip Exercises: Aerobic   Nustep L4 x 5 min     Modalities   Modalities Iontophoresis;Moist Heat     Moist Heat Therapy   Number Minutes Moist Heat 15 Minutes   Moist Heat Location --  Rt inner thigh and low back/buttocks     Iontophoresis   Type of Iontophoresis Dexamethasone   Location R hip   Dose 1.0 mL   Time 6 hour patch     Manual Therapy   Manual Therapy Joint mobilization;Soft tissue mobilization   Joint Mobilization Rt hip distraction in supine with slight abduction also   Soft tissue mobilization STW to Rt gluts/piriformis.QL and hip adductors                 PT Education - 04/26/16 0812    Education provided Yes   Education Details DN   Person(s) Educated Patient   Methods Handout;Explanation   Comprehension Verbalized understanding             PT Long Term Goals - 04/26/16 0844      PT LONG TERM GOAL #1   Title I with advanced HEP ( 05/21/16)   Status On-going      PT LONG TERM GOAL #2   Title report =/> 75% reduction in Rt hip pain ( 05/21/16)    Status On-going     PT LONG TERM GOAL #3   Title improve FOTO =/< 46% limited CK level ( 05/21/16)    Status On-going     PT LONG TERM GOAL #4   Title demo bilat hip strength =/> 4+/5 with out pain to allow her to not fatigue with household ambulation ( 05/21/16)    Status On-going     PT LONG TERM GOAL #5   Title demo bilat prone quad flexibility =/> 135 degrees without pain ( 05/21/16)    Status On-going               Plan - 04/26/16 0844    Clinical Impression Statement Laura Ellison is still having pain in her Rt hip and now moving into the buttock and low back more.  She really feels it when performing prone quad streches so this was taken off her HEP.  She is very tight in her Rt hip adductors and felt some relief of her symptoms with manual work to them.  Added in butterfly stretch. No goals met, mentioned DN, she is not interested at this time however she would benefit.    Rehab Potential Good   Clinical Impairments Affecting Rehab Potential reoccurrence of CA, h/o back issues, deconditioning.    PT Frequency 2x / week   PT Duration 6 weeks   PT Treatment/Interventions Moist Heat;Therapeutic exercise;Dry needling;Taping;Vasopneumatic Device;Manual techniques;Neuromuscular re-education;Cryotherapy;Iontophoresis 15m/ml Dexamethasone;Patient/family education   PT Next Visit Plan cont manual work Rt hip adductors and gluts, when tolerated add in eccentric hip work.    Consulted and Agree with Plan of Care Patient      Patient will benefit from skilled therapeutic intervention in order to improve the following deficits and impairments:  Decreased strength, Increased edema, Pain, Impaired flexibility, Increased muscle spasms, Decreased endurance  Visit Diagnosis: Pain in right hip  Muscle weakness (generalized)  Stiffness of right hip, not elsewhere classified     Problem List Patient Active  Problem List   Diagnosis Date Noted  . Trochanteric bursitis, right hip 04/04/2016  . 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 History  of cerebral infarction 03/28/2016  . Right shoulder pain 04/21/2014  . Primary cancer of cardia of stomach ([place]) 04/10/2014  . ECU (extensor carpi ulnaris), tendonitis 08/05/2012  . Spinal stenosis in cervical region 07/09/2012  . 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 Thyromegaly 12/06/2010  . Emphysema lung ([place]) 12/06/2010  . Tobacco abuse 11/10/2010  . Insulin resistance 11/10/2010  . COPD (chronic obstructive pulmonary disease) ([place]) 11/09/2010  . Graves disease 11/09/2010  . Microscopic hematuria 11/09/2010  . Essential hypertension, benign 11/09/2010  . Other and unspecified hyperlipidemia 11/09/2010    Jeral Pinch PT  04/26/2016, 8:47 AM  [facility] [place], [place], [address] Phone: [phone]   Fax:  [phone]  Name: Laura Ellison MRN: [id number] Date of Birth: [birth year]

## 2016-04-30 ENCOUNTER — Ambulatory Visit (INDEPENDENT_AMBULATORY_CARE_PROVIDER_SITE_OTHER): Payer: Medicare HMO | Admitting: Physical Therapy

## 2016-04-30 DIAGNOSIS — C787 Secondary malignant neoplasm of liver and intrahepatic bile duct: Secondary | ICD-10-CM | POA: Diagnosis not present

## 2016-04-30 DIAGNOSIS — J449 Chronic obstructive pulmonary disease, unspecified: Secondary | ICD-10-CM | POA: Diagnosis not present

## 2016-04-30 DIAGNOSIS — K222 Esophageal obstruction: Secondary | ICD-10-CM | POA: Diagnosis not present

## 2016-04-30 DIAGNOSIS — J439 Emphysema, unspecified: Secondary | ICD-10-CM | POA: Diagnosis not present

## 2016-04-30 DIAGNOSIS — Z888 Allergy status to other drugs, medicaments and biological substances status: Secondary | ICD-10-CM | POA: Diagnosis not present

## 2016-04-30 DIAGNOSIS — M25551 Pain in right hip: Secondary | ICD-10-CM

## 2016-04-30 DIAGNOSIS — M25651 Stiffness of right hip, not elsewhere classified: Secondary | ICD-10-CM | POA: Diagnosis not present

## 2016-04-30 DIAGNOSIS — C159 Malignant neoplasm of esophagus, unspecified: Secondary | ICD-10-CM | POA: Diagnosis not present

## 2016-04-30 DIAGNOSIS — Z880 Allergy status to penicillin: Secondary | ICD-10-CM | POA: Diagnosis not present

## 2016-04-30 DIAGNOSIS — Z87891 Personal history of nicotine dependence: Secondary | ICD-10-CM | POA: Diagnosis not present

## 2016-04-30 DIAGNOSIS — Z791 Long term (current) use of non-steroidal anti-inflammatories (NSAID): Secondary | ICD-10-CM | POA: Diagnosis not present

## 2016-04-30 DIAGNOSIS — I10 Essential (primary) hypertension: Secondary | ICD-10-CM | POA: Diagnosis not present

## 2016-04-30 DIAGNOSIS — T18120A Food in esophagus causing compression of trachea, initial encounter: Secondary | ICD-10-CM | POA: Diagnosis not present

## 2016-04-30 DIAGNOSIS — Z8501 Personal history of malignant neoplasm of esophagus: Secondary | ICD-10-CM | POA: Diagnosis not present

## 2016-04-30 DIAGNOSIS — M6281 Muscle weakness (generalized): Secondary | ICD-10-CM | POA: Diagnosis not present

## 2016-04-30 DIAGNOSIS — R1310 Dysphagia, unspecified: Secondary | ICD-10-CM | POA: Diagnosis not present

## 2016-04-30 DIAGNOSIS — E119 Type 2 diabetes mellitus without complications: Secondary | ICD-10-CM | POA: Diagnosis not present

## 2016-04-30 DIAGNOSIS — T18128A Food in esophagus causing other injury, initial encounter: Secondary | ICD-10-CM | POA: Diagnosis not present

## 2016-04-30 NOTE — Therapy (Signed)
[place], [place], [address] Phone: [phone]   Fax:  [phone]  Physical Therapy Treatment  Patient Details  Name: Laura Ellison MRN: [id number] Date of Birth: Apr 01, 1945 Referring Provider: Dr Dianah Field  Encounter Date: 04/30/2016      PT End of Session - 04/30/16 0854    Visit Number 6   Number of Visits 12   Date for PT Re-Evaluation 05/21/16   PT Start Time 0800   PT Stop Time 0843   PT Time Calculation (min) 43 min   Activity Tolerance Patient tolerated treatment well;No increased pain   Behavior During Therapy WFL for tasks assessed/performed      Past Medical History:  Diagnosis Date  . Anemia    Hx: of as a teen  . Anxiety    Hx; of situational anxiety  . Arthritis   . Dysrhythmia    Hx; of palpitations in the past  . Emphysema   . Family history of anesthesia complication    "son had a problem waking up"  . GERD (gastroesophageal reflux disease)   . Headache(784.0)   . Heart murmur    Hx: of as a child  . Hyperlipidemia   . Hypertension   . IBS (irritable bowel syndrome)    Hx; of  . Pinched nerve in neck   . Pneumonia    Hx:of a "touch' of PNA  . Seasonal allergies    Hx: of  . thyroid problem     Past Surgical History:  Procedure Laterality Date  . ABLATION     Hx: of  . ANTERIOR CERVICAL DECOMPRESSION/DISCECTOMY FUSION 4 LEVELS N/A 12/25/2012   Procedure: ANTERIOR CERVICAL DECOMPRESSION/DISCECTOMY FUSION 4 LEVELS;  Surgeon: Sinclair Ship, MD;  Location: [facility];  Service: Orthopedics;  Laterality: N/A;  Anterior cervical decompression fusion, cervical 4-5, cervical 5-6, cervical 6-7, cervical 7-thoracic 1 with instrumentation, allograft  . APPENDECTOMY    . CARDIAC CATHETERIZATION    . CHOLECYSTECTOMY    . CHOLECYSTECTOMY, LAPAROSCOPIC  07/13/2010  . CYST EXCISION     Hx: of several  . EYE SURGERY     Hx: of cyst removed from left eye  .  FINE NEEDLE ASPIRATION     Hx: of right breast  . TONSILLECTOMY      There were no vitals filed for this visit.      Subjective Assessment - 04/30/16 0802    Subjective waking up in the middle of the night; having a hard time going back to sleep.  hip feels a lot better than when she started.  Rt knee is bothering her today, and back is "no worse, and at times a little bit better."   Pertinent History cervical fusion 2014, gastroesphogeal CA   How long can you sit comfortably? no limitations however occassionally will be uncomfortable in the lower spine.    How long can you walk comfortably? unable to walk stairs due to back and hip issues.    Patient Stated Goals consistently tolerate standing for meal prep,    Pain Score 0-No pain   Multiple Pain Sites Yes   Pain Score 4   Pain Location Knee   Pain Orientation Right   Pain Descriptors / Indicators Dull;Aching   Pain Type Acute pain   Pain Onset Today   Pain Frequency Intermittent   Aggravating Factors  walking into clinic from car, activity   Pain Relieving Factors sitting, rest  [facility] Adult PT Treatment/Exercise - 04/30/16 0805      Knee/Hip Exercises: Stretches   Quad Stretch Right;2 reps;20 seconds   Quad Stretch Limitations therapist performed; in sidelying - better tolerance to this than prone   Other Knee/Hip Stretches Rt hip adductor stretch in half butterfly supine 2x45sec     Knee/Hip Exercises: Aerobic   Nustep L5 x 6 min     Knee/Hip Exercises: Standing   Hip Flexion Both;2 sets;10 reps;Knee bent   Hip Flexion Limitations 2#   Hip Abduction Both;2 sets;10 reps;Knee straight   Abduction Limitations 2#   Hip Extension Both;2 sets;10 reps;Knee straight   Extension Limitations 2#     Knee/Hip Exercises: Seated   Sit to Sand 2 sets;5 reps;with UE support;without UE support  1 set with UE/1 set without UE from elevated surface     Iontophoresis   Type of Iontophoresis  Dexamethasone   Location R hip   Dose 1.0 mL   Time 6 hour patch     Manual Therapy   Manual Therapy Joint mobilization;Soft tissue mobilization   Joint Mobilization Rt hip distraction in supine with slight abduction also   Soft tissue mobilization STW to Rt gluts/piriformis.QL and hip adductors; performed in sidelying                     PT Long Term Goals - 04/26/16 0844      PT LONG TERM GOAL #1   Title I with advanced HEP ( 05/21/16)   Status On-going     PT LONG TERM GOAL #2   Title report =/> 75% reduction in Rt hip pain ( 05/21/16)    Status On-going     PT LONG TERM GOAL #3   Title improve FOTO =/< 46% limited CK level ( 05/21/16)    Status On-going     PT LONG TERM GOAL #4   Title demo bilat hip strength =/> 4+/5 with out pain to allow her to not fatigue with household ambulation ( 05/21/16)    Status On-going     PT LONG TERM GOAL #5   Title demo bilat prone quad flexibility =/> 135 degrees without pain ( 05/21/16)    Status On-going               Plan - 04/30/16 0854    Clinical Impression Statement Pt tolerated session well today without c/o increased pain, but fatigued with strengthening exercises.  Needed multiple seated rest breaks, and tolerated manual today with decreased tenderness.  Will continue to benefit from PT to improve pain and function.   Clinical Impairments Affecting Rehab Potential reoccurrence of CA, h/o back issues, deconditioning.    PT Treatment/Interventions Moist Heat;Therapeutic exercise;Dry needling;Taping;Vasopneumatic Device;Manual techniques;Neuromuscular re-education;Cryotherapy;Iontophoresis 4mg /ml Dexamethasone;Patient/family education   PT Next Visit Plan if pain decreased, try stairs (pt is very fearful), continue strengthening/manual, see if pt can go without ionto patch (would be #6 anyways)   Consulted and Agree with Plan of Care Patient      Patient will benefit from skilled therapeutic intervention in order  to improve the following deficits and impairments:  Decreased strength, Increased edema, Pain, Impaired flexibility, Increased muscle spasms, Decreased endurance  Visit Diagnosis: Pain in right hip  Muscle weakness (generalized)  Stiffness of right hip, not elsewhere classified     Problem List Patient Active Problem List   Diagnosis Date Noted  . Trochanteric bursitis, right hip 04/04/2016  . 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  History of cerebral infarction 03/28/2016  . Right shoulder pain 04/21/2014  . Primary cancer of cardia of stomach ([place]) 04/10/2014  . ECU (extensor carpi ulnaris), tendonitis 08/05/2012  . Spinal stenosis in cervical region 07/09/2012  . 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 Thyromegaly 12/06/2010  . Emphysema lung ([place]) 12/06/2010  . Tobacco abuse 11/10/2010  . Insulin resistance 11/10/2010  . COPD (chronic obstructive pulmonary disease) ([place]) 11/09/2010  . Graves disease 11/09/2010  . Microscopic hematuria 11/09/2010  . Essential hypertension, benign 11/09/2010  . Other and unspecified hyperlipidemia 11/09/2010      Laureen Abrahams, PT, DPT 04/30/16 8:57 AM   [facility] [place], [place], [address] Phone: [phone]   Fax:  [phone]  Name: Laura Ellison MRN: [id number] Date of Birth: Oct 29, 1945

## 2016-05-02 ENCOUNTER — Ambulatory Visit: Payer: Self-pay | Admitting: Sports Medicine

## 2016-05-02 ENCOUNTER — Encounter: Payer: Self-pay | Admitting: Physical Therapy

## 2016-05-06 ENCOUNTER — Other Ambulatory Visit: Payer: Self-pay | Admitting: Internal Medicine

## 2016-05-07 ENCOUNTER — Encounter: Payer: Self-pay | Admitting: Physical Therapy

## 2016-05-07 ENCOUNTER — Ambulatory Visit (INDEPENDENT_AMBULATORY_CARE_PROVIDER_SITE_OTHER): Payer: Medicare HMO | Admitting: Physical Therapy

## 2016-05-07 DIAGNOSIS — M25551 Pain in right hip: Secondary | ICD-10-CM | POA: Diagnosis not present

## 2016-05-07 DIAGNOSIS — M6281 Muscle weakness (generalized): Secondary | ICD-10-CM | POA: Diagnosis not present

## 2016-05-07 DIAGNOSIS — M25651 Stiffness of right hip, not elsewhere classified: Secondary | ICD-10-CM

## 2016-05-07 NOTE — Therapy (Signed)
[place], [place], [address] Phone: [phone]   Fax:  [phone]  Physical Therapy Treatment  Patient Details  Name: Laura Ellison MRN: [id number] Date of Birth: [birth year] Referring Provider: Dr Dianah Field  Encounter Date: 05/07/2016      PT End of Session - 05/07/16 0801    Visit Number 7   Number of Visits 12   Date for PT Re-Evaluation 05/21/16   PT Start Time 0801   PT Stop Time 7564   PT Time Calculation (min) 43 min   Activity Tolerance Patient tolerated treatment well      Past Medical History:  Diagnosis Date  . Anemia    Hx: of as a teen  . Anxiety    Hx; of situational anxiety  . Arthritis   . Dysrhythmia    Hx; of palpitations in the past  . Emphysema   . Family history of anesthesia complication    "son had a problem waking up"  . GERD (gastroesophageal reflux disease)   . Headache(784.0)   . Heart murmur    Hx: of as a child  . Hyperlipidemia   . Hypertension   . IBS (irritable bowel syndrome)    Hx; of  . Pinched nerve in neck   . Pneumonia    Hx:of a "touch' of PNA  . Seasonal allergies    Hx: of  . thyroid problem     Past Surgical History:  Procedure Laterality Date  . ABLATION     Hx: of  . ANTERIOR CERVICAL DECOMPRESSION/DISCECTOMY FUSION 4 LEVELS N/A 12/25/2012   Procedure: ANTERIOR CERVICAL DECOMPRESSION/DISCECTOMY FUSION 4 LEVELS;  Surgeon: Sinclair Ship, MD;  Location: [facility];  Service: Orthopedics;  Laterality: N/A;  Anterior cervical decompression fusion, cervical 4-5, cervical 5-6, cervical 6-7, cervical 7-thoracic 1 with instrumentation, allograft  . APPENDECTOMY    . CARDIAC CATHETERIZATION    . CHOLECYSTECTOMY    . CHOLECYSTECTOMY, LAPAROSCOPIC  07/13/2010  . CYST EXCISION     Hx: of several  . EYE SURGERY     Hx: of cyst removed from left eye  . FINE NEEDLE ASPIRATION     Hx: of right breast  . TONSILLECTOMY      There  were no vitals filed for this visit.      Subjective Assessment - 05/07/16 0801    Subjective Pt reports she hasn't feel good since Friday, hip, back and cancer areas. Pt reports that when she took the ionto patch off she had a buring sensation.    Patient Stated Goals consistently tolerate standing for meal prep,    Currently in Pain? Yes   Pain Score --  0.50   Pain Location Hip   Pain Orientation Right   Pain Descriptors / Indicators Aching   Pain Type Chronic pain   Aggravating Factors  nothing lately   Pain Relieving Factors ionto and ice   Multiple Pain Sites Yes   Pain Score 4   Pain Location Knee   Pain Orientation Right   Pain Descriptors / Indicators Aching   Pain Type Acute pain   Pain Onset 1 to 4 weeks ago   Pain Frequency Constant   Aggravating Factors  moving around   Pain Relieving Factors being still            [facility] PT Assessment - 05/07/16 0001      Assessment   Medical Diagnosis Rt hip bursitis   Referring Provider Dr  Thekkekandam   Onset Date/Surgical Date 09/10/15     Strength   Right/Left Hip Right;Left   Right Hip Flexion 4+/5   Right Hip Extension 3+/5   Right Hip ABduction 3-/5  less pain than on initial eval   Left Hip Flexion 5/5   Left Hip Extension 4+/5   Left Hip ABduction 4+/5   Right/Left Knee --  ilat WNL     Flexibility   Quadriceps prone knee flex Rt 120, Lt 115, lots of pulling in her quads                     [facility] Adult PT Treatment/Exercise - 05/07/16 0001      Exercises   Exercises Knee/Hip     Knee/Hip Exercises: Aerobic   Nustep L4x5', VC for form Rt knee wanting to adduct and IR.      Knee/Hip Exercises: Standing   Wall Squat 2 sets;10 reps  VC for even weightbearing   SLS 2x10 with toe taps, FWD, side, back on each leg     Manual Therapy   Manual Therapy Joint mobilization;Soft tissue mobilization   Joint Mobilization Rt hip distraction in supine with slight abduction, some lower abdominal  discomfort when leg was in neutral, decreased with slight hip flexion.    Soft tissue mobilization STW Rt ITB, hamstring, very tender in the ITB                     PT Long Term Goals - 05/07/16 9417      PT LONG TERM GOAL #1   Title I with advanced HEP ( 05/21/16)   Status On-going     PT LONG TERM GOAL #2   Title report =/> 75% reduction in Rt hip pain ( 05/21/16)    Status Achieved  75% improved     PT LONG TERM GOAL #3   Title improve FOTO =/< 46% limited CK level ( 05/21/16)    Status On-going     PT LONG TERM GOAL #4   Title demo bilat hip strength =/> 4+/5 with out pain to allow her to not fatigue with household ambulation ( 05/21/16)    Status Partially Met     PT LONG TERM GOAL #5   Title demo bilat prone quad flexibility =/> 135 degrees without pain ( 05/21/16)    Status On-going               Plan - 05/07/16 0844    Clinical Impression Statement Laura Ellison continues to do well with hip rehab.  Her Rt knee is still bothering her however she has relief after treatment.  Still very weak in her Rt hip, slight improvement,  Quads are tight and she has limited tolerance to stretching them.  Met a goal and slow progress to the others.  She has them most relief with  hip distraction.  Would benefit from teaching her husband how to perform   Rehab Potential Good   Clinical Impairments Affecting Rehab Potential reoccurrence of CA, h/o back issues, deconditioning.    PT Frequency 2x / week   PT Duration 6 weeks   PT Treatment/Interventions Moist Heat;Therapeutic exercise;Dry needling;Taping;Vasopneumatic Device;Manual techniques;Neuromuscular re-education;Cryotherapy;Iontophoresis 28m/ml Dexamethasone;Patient/family education   PT Next Visit Plan assess response to holding ionto, teach husband hip distactions.    Consulted and Agree with Plan of Care Patient      Patient will benefit from skilled therapeutic intervention in order to improve the following deficits  and impairments:  Decreased strength, Increased edema, Pain, Impaired flexibility, Increased muscle spasms, Decreased endurance  Visit Diagnosis: Pain in right hip  Muscle weakness (generalized)  Stiffness of right hip, not elsewhere classified     Problem List Patient Active Problem List   Diagnosis Date Noted  . Trochanteric bursitis, right hip 04/04/2016  . 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 History of cerebral infarction 03/28/2016  . Right shoulder pain 04/21/2014  . Primary cancer of cardia of stomach ([place]) 04/10/2014  . ECU (extensor carpi ulnaris), tendonitis 08/05/2012  . Spinal stenosis in cervical region 07/09/2012  . 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 Thyromegaly 12/06/2010  . Emphysema lung ([place]) 12/06/2010  . Tobacco abuse 11/10/2010  . Insulin resistance 11/10/2010  . COPD (chronic obstructive pulmonary disease) ([place]) 11/09/2010  . Graves disease 11/09/2010  . Microscopic hematuria 11/09/2010  . Essential hypertension, benign 11/09/2010  . Other and unspecified hyperlipidemia 11/09/2010    Jeral Pinch PT  05/07/2016, 9:49 AM  [facility] [place], [place], [address] Phone: [phone]   Fax:  [phone]  Name: Laura Ellison MRN: [id number] Date of Birth: 04-22-1945

## 2016-05-08 ENCOUNTER — Encounter: Payer: Self-pay | Admitting: Sports Medicine

## 2016-05-08 ENCOUNTER — Ambulatory Visit (INDEPENDENT_AMBULATORY_CARE_PROVIDER_SITE_OTHER): Payer: Medicare HMO | Admitting: Sports Medicine

## 2016-05-08 DIAGNOSIS — M7061 Trochanteric bursitis, right hip: Secondary | ICD-10-CM

## 2016-05-08 NOTE — Assessment & Plan Note (Signed)
Good pain relief, still with a bit of pain and would like to go ahead and proceed with an injection. Return in one month.

## 2016-05-08 NOTE — Progress Notes (Signed)
  Subjective:    CC: Follow-up  HPI: Right trochanteric bursitis: Good improvement with physical therapy, did get some gastritis with NSAIDs. Agreeable to proceed with injection, pain is moderate, persistent, localized without radiation.  Past medical history:  Negative.  See flowsheet/record as well for more information.  Surgical history: Negative.  See flowsheet/record as well for more information.  Family history: Negative.  See flowsheet/record as well for more information.  Social history: Negative.  See flowsheet/record as well for more information.  Allergies, and medications have been entered into the medical record, reviewed, and no changes needed.   Review of Systems: No fevers, chills, night sweats, weight loss, chest pain, or shortness of breath.   Objective:    General: Well Developed, well nourished, and in no acute distress.  Neuro: Alert and oriented x3, extra-ocular muscles intact, sensation grossly intact.  HEENT: Normocephalic, atraumatic, pupils equal round reactive to light, neck supple, no masses, no lymphadenopathy, thyroid nonpalpable.  Skin: Warm and dry, no rashes. Cardiac: Regular rate and rhythm, no murmurs rubs or gallops, no lower extremity edema.  Respiratory: Clear to auscultation bilaterally. Not using accessory muscles, speaking in full sentences. Right Hip: ROM IR: 60 Deg, ER: 60 Deg, Flexion: 120 Deg, Extension: 100 Deg, Abduction: 45 Deg, Adduction: 45 Deg Strength IR: 5/5, ER: 5/5, Flexion: 5/5, Extension: 5/5, Abduction: 5/5, Adduction: 5/5 Pelvic alignment unremarkable to inspection and palpation. Standing hip rotation and gait without trendelenburg / unsteadiness. Greater trochanter with tenderness to palpation. No tenderness over piriformis. No SI joint tenderness and normal minimal SI movement.  Procedure: Real-time Ultrasound Guided Injection of right greater trochanteric bursa Device: GE Logiq E  Verbal informed consent obtained.    Time-out conducted.  Noted no overlying erythema, induration, or other signs of local infection.  Skin prepped in a sterile fashion.  Local anesthesia: Topical Ethyl chloride.  With sterile technique and under real time ultrasound guidance:  Using a 22-gauge spinal Dense through the hip abductor tendons into the trochanteric bursa, contacted bone and injected 1 mL Kenalog 40, 2 mL lidocaine, 2 mL bupivacaine. Completed without difficulty  Pain immediately resolved suggesting accurate placement of the medication.  Advised to call if fevers/chills, erythema, induration, drainage, or persistent bleeding.  Images permanently stored and available for review in the ultrasound unit.  Impression: Technically successful ultrasound guided injection.  Impression and Recommendations:    Trochanteric bursitis, right hip Good pain relief, still with a bit of pain and would like to go ahead and proceed with an injection. Return in one month.

## 2016-05-09 DIAGNOSIS — C786 Secondary malignant neoplasm of retroperitoneum and peritoneum: Secondary | ICD-10-CM | POA: Diagnosis not present

## 2016-05-09 DIAGNOSIS — C160 Malignant neoplasm of cardia: Secondary | ICD-10-CM | POA: Diagnosis not present

## 2016-05-09 DIAGNOSIS — C787 Secondary malignant neoplasm of liver and intrahepatic bile duct: Secondary | ICD-10-CM | POA: Diagnosis not present

## 2016-05-09 DIAGNOSIS — Z5111 Encounter for antineoplastic chemotherapy: Secondary | ICD-10-CM | POA: Diagnosis not present

## 2016-05-09 DIAGNOSIS — D729 Disorder of white blood cells, unspecified: Secondary | ICD-10-CM | POA: Diagnosis not present

## 2016-05-10 ENCOUNTER — Ambulatory Visit (INDEPENDENT_AMBULATORY_CARE_PROVIDER_SITE_OTHER): Payer: Medicare HMO | Admitting: Physical Therapy

## 2016-05-10 DIAGNOSIS — M25651 Stiffness of right hip, not elsewhere classified: Secondary | ICD-10-CM

## 2016-05-10 DIAGNOSIS — M6281 Muscle weakness (generalized): Secondary | ICD-10-CM

## 2016-05-10 DIAGNOSIS — M25551 Pain in right hip: Secondary | ICD-10-CM

## 2016-05-10 NOTE — Therapy (Addendum)
[place], [place], [address] Phone: [phone]   Fax:  [phone]  Physical Therapy Treatment  Patient Details  Name: Laura Ellison MRN: [id number] Date of Birth: [birth year] Referring Provider: Dr Dianah Field  Encounter Date: 05/10/2016      PT End of Session - 05/10/16 0843    Visit Number 8   Number of Visits 12   Date for PT Re-Evaluation 05/21/16   PT Start Time 0803   PT Stop Time 0842   PT Time Calculation (min) 39 min   Activity Tolerance Patient tolerated treatment well   Behavior During Therapy [facility] for tasks assessed/performed      Past Medical History:  Diagnosis Date  . Anemia    Hx: of as a teen  . Anxiety    Hx; of situational anxiety  . Arthritis   . Dysrhythmia    Hx; of palpitations in the past  . Emphysema   . Family history of anesthesia complication    "son had a problem waking up"  . GERD (gastroesophageal reflux disease)   . Headache(784.0)   . Heart murmur    Hx: of as a child  . Hyperlipidemia   . Hypertension   . IBS (irritable bowel syndrome)    Hx; of  . Pinched nerve in neck   . Pneumonia    Hx:of a "touch' of PNA  . Seasonal allergies    Hx: of  . thyroid problem     Past Surgical History:  Procedure Laterality Date  . ABLATION     Hx: of  . ANTERIOR CERVICAL DECOMPRESSION/DISCECTOMY FUSION 4 LEVELS N/A 12/25/2012   Procedure: ANTERIOR CERVICAL DECOMPRESSION/DISCECTOMY FUSION 4 LEVELS;  Surgeon: Sinclair Ship, MD;  Location: [facility];  Service: Orthopedics;  Laterality: N/A;  Anterior cervical decompression fusion, cervical 4-5, cervical 5-6, cervical 6-7, cervical 7-thoracic 1 with instrumentation, allograft  . APPENDECTOMY    . CARDIAC CATHETERIZATION    . CHOLECYSTECTOMY    . CHOLECYSTECTOMY, LAPAROSCOPIC  07/13/2010  . CYST EXCISION     Hx: of several  . EYE SURGERY     Hx: of cyst removed from left eye  . FINE NEEDLE  ASPIRATION     Hx: of right breast  . TONSILLECTOMY      There were no vitals filed for this visit.      Subjective Assessment - 05/10/16 0805    Subjective hip feels great; had injection and no pain.  still a little residual Rt knee pain but hopeful that will resolve soon.  hasn't tried stairs yet   Pertinent History cervical fusion 2014, gastroesphogeal CA   How long can you sit comfortably? no limitations however occassionally will be uncomfortable in the lower spine.    Patient Stated Goals consistently tolerate standing for meal prep,    Pain Score 0-No pain   Pain Score 1   Pain Location Knee   Pain Orientation Right   Pain Descriptors / Indicators Aching   Pain Type Acute pain   Pain Onset In the past 7 days   Pain Frequency Constant   Aggravating Factors  walking   Pain Relieving Factors being still                         [facility] Adult PT Treatment/Exercise - 05/10/16 0807      Ambulation/Gait   Stairs Yes   Stairs Assistance 5: Supervision   Stairs Assistance  Details (indicate cue type and reason) min cues for sequencing; no pain but unable to ascend steps reciprocally with RLE leading   Stair Management Technique One rail Left;Alternating pattern;One rail Right;Step to pattern  descending alternating; ascending step to   Number of Stairs 8   Height of Stairs 6     Knee/Hip Exercises: Aerobic   Nustep L3 x 6 min     Knee/Hip Exercises: Standing   Forward Step Up Right;2 sets;10 reps;Hand Hold: 2;Step Height: 4"   Forward Step Up Limitations pt needs quite a bit of UE support   Functional Squat 2 sets;10 reps   Functional Squat Limitations cues for technique   SLS bil LEs 5x10 sec with hip abduction of opposite leg; taps to low steps 1 and 2 x 10 bil with 1 UE support   Walking with Sports Cord sidestepping and backwards with red theraband 10'x5 reps each direction     Knee/Hip Exercises: Supine   Bridges Limitations 2x10 with red theraband to  maintain neutral hip     Manual Therapy   Joint Mobilization instructed husband in long axis distraction; pt and husband returned and demonstrated proper technique                PT Education - 05/10/16 0843    Education provided Yes   Education Details instructed husband in long axis distraction of Rt hip   Person(s) Educated Patient;Spouse   Methods Explanation;Demonstration   Comprehension Verbalized understanding;Returned demonstration             PT Long Term Goals - 05/07/16 5732      PT LONG TERM GOAL #1   Title I with advanced HEP ( 05/21/16)   Status On-going     PT LONG TERM GOAL #2   Title report =/> 75% reduction in Rt hip pain ( 05/21/16)    Status Achieved  75% improved     PT LONG TERM GOAL #3   Title improve FOTO =/< 46% limited CK level ( 05/21/16)    Status On-going     PT LONG TERM GOAL #4   Title demo bilat hip strength =/> 4+/5 with out pain to allow her to not fatigue with household ambulation ( 05/21/16)    Status Partially Met     PT LONG TERM GOAL #5   Title demo bilat prone quad flexibility =/> 135 degrees without pain ( 05/21/16)    Status On-going               Plan - 05/10/16 0844    Clinical Impression Statement Pt tolerated session well and husband educated on manual long axis distraction to perform at home.  Injection was very helpful and no increased pain at end of session.  Will continue to benefit from PT to maximize function.   PT Treatment/Interventions Moist Heat;Therapeutic exercise;Dry needling;Taping;Vasopneumatic Device;Manual techniques;Neuromuscular re-education;Cryotherapy;Iontophoresis 69m/ml Dexamethasone;Patient/family education   PT Next Visit Plan continue per POC, anticipate possible d/c unless pain returns   Consulted and Agree with Plan of Care Patient      Patient will benefit from skilled therapeutic intervention in order to improve the following deficits and impairments:  Decreased strength,  Increased edema, Pain, Impaired flexibility, Increased muscle spasms, Decreased endurance  Visit Diagnosis: Pain in right hip  Muscle weakness (generalized)  Stiffness of right hip, not elsewhere classified     Problem List Patient Active Problem List   Diagnosis Date Noted  . Trochanteric bursitis, right hip 04/04/2016  .  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 History of cerebral infarction 03/28/2016  . Right shoulder pain 04/21/2014  . Primary cancer of cardia of stomach ([place]) 04/10/2014  . ECU (extensor carpi ulnaris), tendonitis 08/05/2012  . Spinal stenosis in cervical region 07/09/2012  . 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 Thyromegaly 12/06/2010  . Emphysema lung ([place]) 12/06/2010  . Tobacco abuse 11/10/2010  . Insulin resistance 11/10/2010  . COPD (chronic obstructive pulmonary disease) ([place]) 11/09/2010  . Graves disease 11/09/2010  . Microscopic hematuria 11/09/2010  . Essential hypertension, benign 11/09/2010  . Other and unspecified hyperlipidemia 11/09/2010      Laureen Abrahams, PT, DPT 05/10/16 8:46 AM    [facility] [place], [place], [address] Phone: [phone]   Fax:  [phone]  Name: Laura Ellison MRN: [id number] Date of Birth: Jun 17, 1945  PHYSICAL THERAPY DISCHARGE SUMMARY  Visits from Start of Care: 8  Current functional level related to goals / functional outcomes: unknown   Remaining deficits: Unknown, patient was doing well with managing her hip pain at her last visit and she has not returned.  She does have other medical issues she is dealing with.    Education / Equipment: HEP Plan:                                                    Patient goals were partially met. Patient is being discharged due to not returning since the last visit.  ?????   Jeral Pinch, PT 07/11/16 3:29  PM

## 2016-05-14 ENCOUNTER — Encounter: Payer: Self-pay | Admitting: Physical Therapy

## 2016-05-17 ENCOUNTER — Encounter: Payer: Self-pay | Admitting: Physical Therapy

## 2016-05-17 DIAGNOSIS — I10 Essential (primary) hypertension: Secondary | ICD-10-CM | POA: Diagnosis not present

## 2016-05-17 DIAGNOSIS — D378 Neoplasm of uncertain behavior of other specified digestive organs: Secondary | ICD-10-CM | POA: Diagnosis not present

## 2016-05-17 DIAGNOSIS — I771 Stricture of artery: Secondary | ICD-10-CM | POA: Diagnosis not present

## 2016-05-17 DIAGNOSIS — Z79899 Other long term (current) drug therapy: Secondary | ICD-10-CM | POA: Diagnosis not present

## 2016-05-17 DIAGNOSIS — N281 Cyst of kidney, acquired: Secondary | ICD-10-CM | POA: Diagnosis not present

## 2016-05-17 DIAGNOSIS — I700 Atherosclerosis of aorta: Secondary | ICD-10-CM | POA: Diagnosis not present

## 2016-05-17 DIAGNOSIS — E079 Disorder of thyroid, unspecified: Secondary | ICD-10-CM | POA: Diagnosis not present

## 2016-05-17 DIAGNOSIS — R918 Other nonspecific abnormal finding of lung field: Secondary | ICD-10-CM | POA: Diagnosis not present

## 2016-05-17 DIAGNOSIS — E119 Type 2 diabetes mellitus without complications: Secondary | ICD-10-CM | POA: Diagnosis not present

## 2016-05-17 DIAGNOSIS — C7801 Secondary malignant neoplasm of right lung: Secondary | ICD-10-CM | POA: Diagnosis not present

## 2016-05-17 DIAGNOSIS — Z8509 Personal history of malignant neoplasm of other digestive organs: Secondary | ICD-10-CM | POA: Diagnosis not present

## 2016-05-17 DIAGNOSIS — Z87891 Personal history of nicotine dependence: Secondary | ICD-10-CM | POA: Diagnosis not present

## 2016-05-17 DIAGNOSIS — K769 Liver disease, unspecified: Secondary | ICD-10-CM | POA: Diagnosis not present

## 2016-05-18 DIAGNOSIS — H35372 Puckering of macula, left eye: Secondary | ICD-10-CM | POA: Diagnosis not present

## 2016-05-18 DIAGNOSIS — H35342 Macular cyst, hole, or pseudohole, left eye: Secondary | ICD-10-CM | POA: Diagnosis not present

## 2016-05-18 DIAGNOSIS — H35371 Puckering of macula, right eye: Secondary | ICD-10-CM | POA: Diagnosis not present

## 2016-05-18 DIAGNOSIS — Z8542 Personal history of malignant neoplasm of other parts of uterus: Secondary | ICD-10-CM | POA: Diagnosis not present

## 2016-05-18 DIAGNOSIS — H35431 Paving stone degeneration of retina, right eye: Secondary | ICD-10-CM | POA: Diagnosis not present

## 2016-05-18 DIAGNOSIS — H43813 Vitreous degeneration, bilateral: Secondary | ICD-10-CM | POA: Diagnosis not present

## 2016-05-18 DIAGNOSIS — H35432 Paving stone degeneration of retina, left eye: Secondary | ICD-10-CM | POA: Diagnosis not present

## 2016-05-23 DIAGNOSIS — Z5181 Encounter for therapeutic drug level monitoring: Secondary | ICD-10-CM | POA: Diagnosis not present

## 2016-05-23 DIAGNOSIS — C786 Secondary malignant neoplasm of retroperitoneum and peritoneum: Secondary | ICD-10-CM | POA: Diagnosis not present

## 2016-05-23 DIAGNOSIS — Z79899 Other long term (current) drug therapy: Secondary | ICD-10-CM | POA: Diagnosis not present

## 2016-05-23 DIAGNOSIS — K5900 Constipation, unspecified: Secondary | ICD-10-CM | POA: Diagnosis not present

## 2016-05-23 DIAGNOSIS — C787 Secondary malignant neoplasm of liver and intrahepatic bile duct: Secondary | ICD-10-CM | POA: Diagnosis not present

## 2016-05-23 DIAGNOSIS — C160 Malignant neoplasm of cardia: Secondary | ICD-10-CM | POA: Diagnosis not present

## 2016-06-05 ENCOUNTER — Encounter: Payer: Self-pay | Admitting: Sports Medicine

## 2016-06-05 ENCOUNTER — Ambulatory Visit (INDEPENDENT_AMBULATORY_CARE_PROVIDER_SITE_OTHER): Payer: Medicare HMO | Admitting: Sports Medicine

## 2016-06-05 DIAGNOSIS — M4696 Unspecified inflammatory spondylopathy, lumbar region: Secondary | ICD-10-CM | POA: Diagnosis not present

## 2016-06-05 DIAGNOSIS — M7061 Trochanteric bursitis, right hip: Secondary | ICD-10-CM | POA: Diagnosis not present

## 2016-06-05 DIAGNOSIS — M47816 Spondylosis without myelopathy or radiculopathy, lumbar region: Secondary | ICD-10-CM

## 2016-06-05 NOTE — Assessment & Plan Note (Signed)
Severe bilateral L4-5 and L5-S1 facet arthritis that we will use initially for an interventional target. She does have moderate to severe central canal stenosis at L3-L4 and L4-L5 as well that can also be used as interventional targets with epidurals should she fail the facet joint injections.

## 2016-06-05 NOTE — Assessment & Plan Note (Signed)
Resolved with injection at the last visit and physical therapy.

## 2016-06-05 NOTE — Progress Notes (Signed)
  Subjective:    CC: Follow-up  HPI: Trochanteric bursitis: Resolved with physical therapy and injection.  Back pain: Multifactorial with multilevel lumbar spinal stenosis, and severe lower level facet arthritis. Her pain now is predominantly when standing, she does feel better when walking in a flexed position. She gets gelling. Nothing radicular. No bowel or bladder dysfunction, saddle numbness, constitutional symptoms. We're, persistent.  Past medical history:  Negative.  See flowsheet/record as well for more information.  Surgical history: Negative.  See flowsheet/record as well for more information.  Family history: Negative.  See flowsheet/record as well for more information.  Social history: Negative.  See flowsheet/record as well for more information.  Allergies, and medications have been entered into the medical record, reviewed, and no changes needed.   Review of Systems: No fevers, chills, night sweats, weight loss, chest pain, or shortness of breath.   Objective:    General: Well Developed, well nourished, and in no acute distress.  Neuro: Alert and oriented x3, extra-ocular muscles intact, sensation grossly intact.  HEENT: Normocephalic, atraumatic, pupils equal round reactive to light, neck supple, no masses, no lymphadenopathy, thyroid nonpalpable.  Skin: Warm and dry, no rashes. Cardiac: Regular rate and rhythm, no murmurs rubs or gallops, no lower extremity edema.  Respiratory: Clear to auscultation bilaterally. Not using accessory muscles, speaking in full sentences.  MRI personally reviewed, she does have moderate to severe central canal stenosis at the L3-L4 and L4-L5 levels, as well as severe facet arthritis worst on the right at the L5-S1 level but bilateral, and also moderate at the L4-L5 level.  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 She does have moderate to severe central canal stenosis at L3-L4 and L4-L5 as well that can also be used as interventional targets with epidurals should she fail the facet joint injections.  Trochanteric bursitis, right hip Resolved with injection at the last visit and physical therapy.  I spent 25 minutes with this patient, greater than 50% was face-to-face time counseling regarding the above diagnoses

## 2016-06-06 DIAGNOSIS — R970 Elevated carcinoembryonic antigen [CEA]: Secondary | ICD-10-CM | POA: Diagnosis not present

## 2016-06-06 DIAGNOSIS — I10 Essential (primary) hypertension: Secondary | ICD-10-CM | POA: Diagnosis not present

## 2016-06-06 DIAGNOSIS — R1319 Other dysphagia: Secondary | ICD-10-CM | POA: Diagnosis not present

## 2016-06-06 DIAGNOSIS — Z5111 Encounter for antineoplastic chemotherapy: Secondary | ICD-10-CM | POA: Diagnosis not present

## 2016-06-06 DIAGNOSIS — E042 Nontoxic multinodular goiter: Secondary | ICD-10-CM | POA: Diagnosis not present

## 2016-06-06 DIAGNOSIS — C787 Secondary malignant neoplasm of liver and intrahepatic bile duct: Secondary | ICD-10-CM | POA: Diagnosis not present

## 2016-06-06 DIAGNOSIS — J449 Chronic obstructive pulmonary disease, unspecified: Secondary | ICD-10-CM | POA: Diagnosis not present

## 2016-06-06 DIAGNOSIS — R69 Illness, unspecified: Secondary | ICD-10-CM | POA: Diagnosis not present

## 2016-06-06 DIAGNOSIS — C160 Malignant neoplasm of cardia: Secondary | ICD-10-CM | POA: Diagnosis not present

## 2016-06-06 DIAGNOSIS — C786 Secondary malignant neoplasm of retroperitoneum and peritoneum: Secondary | ICD-10-CM | POA: Diagnosis not present

## 2016-06-11 ENCOUNTER — Inpatient Hospital Stay: Admission: RE | Admit: 2016-06-11 | Payer: Self-pay | Source: Ambulatory Visit

## 2016-06-12 ENCOUNTER — Encounter: Payer: Self-pay | Admitting: Family Medicine

## 2016-06-12 ENCOUNTER — Ambulatory Visit (INDEPENDENT_AMBULATORY_CARE_PROVIDER_SITE_OTHER): Payer: Medicare HMO | Admitting: Family Medicine

## 2016-06-12 ENCOUNTER — Ambulatory Visit: Payer: Self-pay

## 2016-06-12 VITALS — BP 125/63 | HR 85 | Wt 150.0 lb

## 2016-06-12 DIAGNOSIS — G250 Essential tremor: Secondary | ICD-10-CM | POA: Diagnosis not present

## 2016-06-12 DIAGNOSIS — C160 Malignant neoplasm of cardia: Secondary | ICD-10-CM | POA: Diagnosis not present

## 2016-06-12 DIAGNOSIS — J019 Acute sinusitis, unspecified: Secondary | ICD-10-CM | POA: Diagnosis not present

## 2016-06-12 DIAGNOSIS — E8881 Metabolic syndrome: Secondary | ICD-10-CM | POA: Diagnosis not present

## 2016-06-12 LAB — POCT GLYCOSYLATED HEMOGLOBIN (HGB A1C): Hemoglobin A1C: 5.3

## 2016-06-12 MED ORDER — PROPRANOLOL HCL 10 MG PO TABS: 10.0000 mg | ORAL_TABLET | Freq: Two times a day (BID) | ORAL | 1 refills | Status: AC

## 2016-06-12 MED ORDER — AZITHROMYCIN 250 MG PO TABS: ORAL_TABLET | ORAL | 0 refills | Status: AC

## 2016-06-12 NOTE — Progress Notes (Signed)
Subjective:    CC: BP, IFG -   HPI:  Hypertension- Pt denies chest pain, SOB, dizziness, or heart palpitations.  Taking meds as directed w/o problems.  Denies medication side effects.    Impaired fasting glucose-no increased thirst or urination. No symptoms consistent with hypoglycemia.  Essential tremor-She does say that the metoprolol half tab in the morning does seem to help. Her tremors usually a little better in the morning and then gets worse as the day progresses.  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   He also feels like she's getting a sinus infection. She's had left ear and jaw pain some pressure behind her left eye. She says this is feeling like a typical sinus infection for her she started to get some mild congestion. She's been using Biofreeze on the outer ear jaw area and that does seem to help a little bit. She's been sneezing. Symptoms may going on for 4 days. No fevers chills or sweats.  Past medical history, Surgical history, Family history not pertinant except as noted below, Social history, Allergies, and medications have been entered into the medical record, reviewed, and corrections made.   Review of Systems: No fevers, chills, night sweats, weight loss, chest pain, or shortness of breath.   Objective:    General: Well Developed, well nourished, and in no acute distress.  Neuro: Alert and oriented x3, extra-ocular muscles intact, sensation grossly intact.  HEENT: Normocephalic, atraumatic,Her parents is clear, TMs and canals are clear bilaterally. No significant cervical lymphadenopathy. Skin: Warm and dry, no rashes. Cardiac: Regular rate and rhythm, no murmurs rubs or gallops, no lower extremity edema.  Respiratory: Clear to auscultation bilaterally. Not using accessory muscles, speaking in full sentences.   Impression and Recommendations:    HTN - Well controlled. Continue current regimen. Follow up in  6 months.     IFG -  Stable with a1C of 5.3    Essential tremor - as discussed previously can consider switching metoprolol to propranolol which is first-line therapy for tremor. Or even consider primidone.  Early sinusitis, left-we'll go ahead and treat with azithromycin. It's a little bit premature. I did encourage her to try to give it a couple more days. But she is worried that she'll end up very sick again and since she is immunosuppressed and on chemotherapy she is very concerned.

## 2016-06-15 ENCOUNTER — Ambulatory Visit
Admission: RE | Admit: 2016-06-15 | Discharge: 2016-06-15 | Disposition: A | Discharge status: Home / Self Care | Payer: Medicare HMO | Source: Ambulatory Visit | Attending: Sports Medicine | Admitting: Sports Medicine

## 2016-06-15 ENCOUNTER — Other Ambulatory Visit: Payer: Self-pay | Admitting: Sports Medicine

## 2016-06-15 DIAGNOSIS — M47816 Spondylosis without myelopathy or radiculopathy, lumbar region: Secondary | ICD-10-CM

## 2016-06-15 DIAGNOSIS — M545 Low back pain: Secondary | ICD-10-CM | POA: Diagnosis not present

## 2016-06-15 MED ORDER — IOPAMIDOL (ISOVUE-M 200) INJECTION 41%
1.0000 mL | Freq: Once | INTRAMUSCULAR | Status: AC
  Administered 2016-06-15: 1 mL via INTRA_ARTICULAR

## 2016-06-15 MED ORDER — METHYLPREDNISOLONE ACETATE 40 MG/ML INJ SUSP (RADIOLOG
120.0000 mg | Freq: Once | INTRAMUSCULAR | Status: AC
  Administered 2016-06-15: 120 mg via INTRA_ARTICULAR

## 2016-06-15 NOTE — Discharge Instructions (Signed)

## 2016-06-20 DIAGNOSIS — I10 Essential (primary) hypertension: Secondary | ICD-10-CM | POA: Diagnosis not present

## 2016-06-20 DIAGNOSIS — R970 Elevated carcinoembryonic antigen [CEA]: Secondary | ICD-10-CM | POA: Diagnosis not present

## 2016-06-20 DIAGNOSIS — R634 Abnormal weight loss: Secondary | ICD-10-CM | POA: Diagnosis not present

## 2016-06-20 DIAGNOSIS — C786 Secondary malignant neoplasm of retroperitoneum and peritoneum: Secondary | ICD-10-CM | POA: Diagnosis not present

## 2016-06-20 DIAGNOSIS — E039 Hypothyroidism, unspecified: Secondary | ICD-10-CM | POA: Diagnosis not present

## 2016-06-20 DIAGNOSIS — R1310 Dysphagia, unspecified: Secondary | ICD-10-CM | POA: Diagnosis not present

## 2016-06-20 DIAGNOSIS — C160 Malignant neoplasm of cardia: Secondary | ICD-10-CM | POA: Diagnosis not present

## 2016-06-20 DIAGNOSIS — R1032 Left lower quadrant pain: Secondary | ICD-10-CM | POA: Diagnosis not present

## 2016-06-20 DIAGNOSIS — J449 Chronic obstructive pulmonary disease, unspecified: Secondary | ICD-10-CM | POA: Diagnosis not present

## 2016-06-20 DIAGNOSIS — C787 Secondary malignant neoplasm of liver and intrahepatic bile duct: Secondary | ICD-10-CM | POA: Diagnosis not present

## 2016-07-04 DIAGNOSIS — Z5181 Encounter for therapeutic drug level monitoring: Secondary | ICD-10-CM | POA: Diagnosis not present

## 2016-07-04 DIAGNOSIS — K5900 Constipation, unspecified: Secondary | ICD-10-CM | POA: Diagnosis not present

## 2016-07-04 DIAGNOSIS — C787 Secondary malignant neoplasm of liver and intrahepatic bile duct: Secondary | ICD-10-CM | POA: Diagnosis not present

## 2016-07-04 DIAGNOSIS — R1032 Left lower quadrant pain: Secondary | ICD-10-CM | POA: Diagnosis not present

## 2016-07-04 DIAGNOSIS — C786 Secondary malignant neoplasm of retroperitoneum and peritoneum: Secondary | ICD-10-CM | POA: Diagnosis not present

## 2016-07-04 DIAGNOSIS — J449 Chronic obstructive pulmonary disease, unspecified: Secondary | ICD-10-CM | POA: Diagnosis not present

## 2016-07-04 DIAGNOSIS — C160 Malignant neoplasm of cardia: Secondary | ICD-10-CM | POA: Diagnosis not present

## 2016-07-04 DIAGNOSIS — R69 Illness, unspecified: Secondary | ICD-10-CM | POA: Diagnosis not present

## 2016-07-04 DIAGNOSIS — R970 Elevated carcinoembryonic antigen [CEA]: Secondary | ICD-10-CM | POA: Diagnosis not present

## 2016-07-04 DIAGNOSIS — R1310 Dysphagia, unspecified: Secondary | ICD-10-CM | POA: Diagnosis not present

## 2016-07-09 ENCOUNTER — Encounter: Payer: Self-pay | Admitting: Sports Medicine

## 2016-07-09 ENCOUNTER — Ambulatory Visit (INDEPENDENT_AMBULATORY_CARE_PROVIDER_SITE_OTHER): Payer: Medicare HMO | Admitting: Sports Medicine

## 2016-07-09 DIAGNOSIS — M4696 Unspecified inflammatory spondylopathy, lumbar region: Secondary | ICD-10-CM | POA: Diagnosis not present

## 2016-07-09 DIAGNOSIS — M47816 Spondylosis without myelopathy or radiculopathy, lumbar region: Secondary | ICD-10-CM

## 2016-07-09 NOTE — Assessment & Plan Note (Signed)
Severe bilateral L4-5 and L5-S1 facet arthritis, these were injected, and she reported near 100% pain relief and hour after the injection. She continues to have fantastic relief, she is functional, and no further intervention desired. I did explain to her that radiofrequency ablation could be the next step, she did have moderate to severe central canal stenosis at L3-L4 and L4-L5 that we get also uses an interventional target in the future. Until then she can return as needed.

## 2016-07-09 NOTE — Progress Notes (Signed)
  Subjective:    CC: Follow-up  HPI: Dayana returns, she had her facet joint injections about 3 weeks ago and is doing fantastic.  Past medical history:  Negative.  See flowsheet/record as well for more information.  Surgical history: Negative.  See flowsheet/record as well for more information.  Family history: Negative.  See flowsheet/record as well for more information.  Social history: Negative.  See flowsheet/record as well for more information.  Allergies, and medications have been entered into the medical record, reviewed, and no changes needed.   Review of Systems: No fevers, chills, night sweats, weight loss, chest pain, or shortness of breath.   Objective:    General: Well Developed, well nourished, and in no acute distress.  Neuro: Alert and oriented x3, extra-ocular muscles intact, sensation grossly intact.  HEENT: Normocephalic, atraumatic, pupils equal round reactive to light, neck supple, no masses, no lymphadenopathy, thyroid nonpalpable.  Skin: Warm and dry, no rashes. Cardiac: Regular rate and rhythm, no murmurs rubs or gallops, no lower extremity edema.  Respiratory: Clear to auscultation bilaterally. Not using accessory muscles, speaking in full sentences.  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 She continues to have fantastic relief, she is functional, and no further intervention desired. I did explain to her that radiofrequency ablation could be the next step, she did have moderate to severe central canal stenosis at L3-L4 and L4-L5 that we get also uses an interventional target in the future. Until then she can return as needed.  I spent 25 minutes with this patient, greater than 50% was face-to-face time counseling regarding the above diagnoses

## 2016-07-16 DIAGNOSIS — R1310 Dysphagia, unspecified: Secondary | ICD-10-CM | POA: Diagnosis not present

## 2016-07-16 DIAGNOSIS — K769 Liver disease, unspecified: Secondary | ICD-10-CM | POA: Diagnosis not present

## 2016-07-16 DIAGNOSIS — N858 Other specified noninflammatory disorders of uterus: Secondary | ICD-10-CM | POA: Diagnosis not present

## 2016-07-16 DIAGNOSIS — R918 Other nonspecific abnormal finding of lung field: Secondary | ICD-10-CM | POA: Diagnosis not present

## 2016-07-16 DIAGNOSIS — C786 Secondary malignant neoplasm of retroperitoneum and peritoneum: Secondary | ICD-10-CM | POA: Diagnosis not present

## 2016-07-16 DIAGNOSIS — C160 Malignant neoplasm of cardia: Secondary | ICD-10-CM | POA: Diagnosis not present

## 2016-07-16 DIAGNOSIS — C787 Secondary malignant neoplasm of liver and intrahepatic bile duct: Secondary | ICD-10-CM | POA: Diagnosis not present

## 2016-07-16 DIAGNOSIS — D490 Neoplasm of unspecified behavior of digestive system: Secondary | ICD-10-CM | POA: Diagnosis not present

## 2016-07-16 DIAGNOSIS — R970 Elevated carcinoembryonic antigen [CEA]: Secondary | ICD-10-CM | POA: Diagnosis not present

## 2016-07-18 DIAGNOSIS — C786 Secondary malignant neoplasm of retroperitoneum and peritoneum: Secondary | ICD-10-CM | POA: Diagnosis not present

## 2016-07-18 DIAGNOSIS — Z923 Personal history of irradiation: Secondary | ICD-10-CM | POA: Diagnosis not present

## 2016-07-18 DIAGNOSIS — Z5111 Encounter for antineoplastic chemotherapy: Secondary | ICD-10-CM | POA: Diagnosis not present

## 2016-07-18 DIAGNOSIS — J449 Chronic obstructive pulmonary disease, unspecified: Secondary | ICD-10-CM | POA: Diagnosis not present

## 2016-07-18 DIAGNOSIS — I10 Essential (primary) hypertension: Secondary | ICD-10-CM | POA: Diagnosis not present

## 2016-07-18 DIAGNOSIS — Z5181 Encounter for therapeutic drug level monitoring: Secondary | ICD-10-CM | POA: Diagnosis not present

## 2016-07-18 DIAGNOSIS — R970 Elevated carcinoembryonic antigen [CEA]: Secondary | ICD-10-CM | POA: Diagnosis not present

## 2016-07-18 DIAGNOSIS — R1310 Dysphagia, unspecified: Secondary | ICD-10-CM | POA: Diagnosis not present

## 2016-07-18 DIAGNOSIS — C160 Malignant neoplasm of cardia: Secondary | ICD-10-CM | POA: Diagnosis not present

## 2016-07-18 DIAGNOSIS — D72821 Monocytosis (symptomatic): Secondary | ICD-10-CM | POA: Diagnosis not present

## 2016-07-18 DIAGNOSIS — Z79899 Other long term (current) drug therapy: Secondary | ICD-10-CM | POA: Diagnosis not present

## 2016-07-18 DIAGNOSIS — R69 Illness, unspecified: Secondary | ICD-10-CM | POA: Diagnosis not present

## 2016-07-18 DIAGNOSIS — C787 Secondary malignant neoplasm of liver and intrahepatic bile duct: Secondary | ICD-10-CM | POA: Diagnosis not present

## 2016-07-23 ENCOUNTER — Other Ambulatory Visit: Payer: Self-pay | Admitting: Family Medicine

## 2016-08-01 DIAGNOSIS — X58XXXA Exposure to other specified factors, initial encounter: Secondary | ICD-10-CM | POA: Diagnosis not present

## 2016-08-01 DIAGNOSIS — T18128A Food in esophagus causing other injury, initial encounter: Secondary | ICD-10-CM | POA: Diagnosis not present

## 2016-08-01 DIAGNOSIS — Z5181 Encounter for therapeutic drug level monitoring: Secondary | ICD-10-CM | POA: Diagnosis not present

## 2016-08-01 DIAGNOSIS — K222 Esophageal obstruction: Secondary | ICD-10-CM | POA: Diagnosis not present

## 2016-08-01 DIAGNOSIS — Z791 Long term (current) use of non-steroidal anti-inflammatories (NSAID): Secondary | ICD-10-CM | POA: Diagnosis not present

## 2016-08-01 DIAGNOSIS — E785 Hyperlipidemia, unspecified: Secondary | ICD-10-CM | POA: Diagnosis not present

## 2016-08-01 DIAGNOSIS — C158 Malignant neoplasm of overlapping sites of esophagus: Secondary | ICD-10-CM | POA: Diagnosis not present

## 2016-08-01 DIAGNOSIS — C159 Malignant neoplasm of esophagus, unspecified: Secondary | ICD-10-CM | POA: Diagnosis not present

## 2016-08-01 DIAGNOSIS — R1310 Dysphagia, unspecified: Secondary | ICD-10-CM | POA: Diagnosis not present

## 2016-08-01 DIAGNOSIS — C786 Secondary malignant neoplasm of retroperitoneum and peritoneum: Secondary | ICD-10-CM | POA: Diagnosis not present

## 2016-08-01 DIAGNOSIS — R109 Unspecified abdominal pain: Secondary | ICD-10-CM | POA: Diagnosis not present

## 2016-08-01 DIAGNOSIS — Z87891 Personal history of nicotine dependence: Secondary | ICD-10-CM | POA: Diagnosis not present

## 2016-08-01 DIAGNOSIS — J449 Chronic obstructive pulmonary disease, unspecified: Secondary | ICD-10-CM | POA: Diagnosis not present

## 2016-08-01 DIAGNOSIS — I10 Essential (primary) hypertension: Secondary | ICD-10-CM | POA: Diagnosis not present

## 2016-08-01 DIAGNOSIS — Z5111 Encounter for antineoplastic chemotherapy: Secondary | ICD-10-CM | POA: Diagnosis not present

## 2016-08-01 DIAGNOSIS — C160 Malignant neoplasm of cardia: Secondary | ICD-10-CM | POA: Diagnosis not present

## 2016-08-01 DIAGNOSIS — R69 Illness, unspecified: Secondary | ICD-10-CM | POA: Diagnosis not present

## 2016-08-01 DIAGNOSIS — T451X5A Adverse effect of antineoplastic and immunosuppressive drugs, initial encounter: Secondary | ICD-10-CM | POA: Diagnosis not present

## 2016-08-01 DIAGNOSIS — D72821 Monocytosis (symptomatic): Secondary | ICD-10-CM | POA: Diagnosis not present

## 2016-08-01 DIAGNOSIS — K219 Gastro-esophageal reflux disease without esophagitis: Secondary | ICD-10-CM | POA: Diagnosis not present

## 2016-08-01 DIAGNOSIS — R970 Elevated carcinoembryonic antigen [CEA]: Secondary | ICD-10-CM | POA: Diagnosis not present

## 2016-08-01 DIAGNOSIS — E079 Disorder of thyroid, unspecified: Secondary | ICD-10-CM | POA: Diagnosis not present

## 2016-08-01 DIAGNOSIS — C787 Secondary malignant neoplasm of liver and intrahepatic bile duct: Secondary | ICD-10-CM | POA: Diagnosis not present

## 2016-08-01 DIAGNOSIS — Z79899 Other long term (current) drug therapy: Secondary | ICD-10-CM | POA: Diagnosis not present

## 2016-08-02 DIAGNOSIS — Z5181 Encounter for therapeutic drug level monitoring: Secondary | ICD-10-CM | POA: Diagnosis not present

## 2016-08-02 DIAGNOSIS — Z79899 Other long term (current) drug therapy: Secondary | ICD-10-CM | POA: Diagnosis not present

## 2016-08-02 DIAGNOSIS — R69 Illness, unspecified: Secondary | ICD-10-CM | POA: Diagnosis not present

## 2016-08-02 DIAGNOSIS — C787 Secondary malignant neoplasm of liver and intrahepatic bile duct: Secondary | ICD-10-CM | POA: Diagnosis not present

## 2016-08-02 DIAGNOSIS — K222 Esophageal obstruction: Secondary | ICD-10-CM | POA: Diagnosis not present

## 2016-08-02 DIAGNOSIS — C160 Malignant neoplasm of cardia: Secondary | ICD-10-CM | POA: Diagnosis not present

## 2016-08-02 DIAGNOSIS — I10 Essential (primary) hypertension: Secondary | ICD-10-CM | POA: Diagnosis not present

## 2016-08-02 DIAGNOSIS — J449 Chronic obstructive pulmonary disease, unspecified: Secondary | ICD-10-CM | POA: Diagnosis not present

## 2016-08-02 DIAGNOSIS — R1310 Dysphagia, unspecified: Secondary | ICD-10-CM | POA: Diagnosis not present

## 2016-08-02 DIAGNOSIS — C786 Secondary malignant neoplasm of retroperitoneum and peritoneum: Secondary | ICD-10-CM | POA: Diagnosis not present

## 2016-08-02 DIAGNOSIS — Z923 Personal history of irradiation: Secondary | ICD-10-CM | POA: Diagnosis not present

## 2016-08-03 ENCOUNTER — Other Ambulatory Visit: Payer: Self-pay | Admitting: Internal Medicine

## 2016-08-04 ENCOUNTER — Other Ambulatory Visit: Payer: Self-pay | Admitting: Sports Medicine

## 2016-08-04 DIAGNOSIS — M7061 Trochanteric bursitis, right hip: Secondary | ICD-10-CM

## 2016-08-05 DIAGNOSIS — C160 Malignant neoplasm of cardia: Secondary | ICD-10-CM | POA: Diagnosis not present

## 2016-08-05 DIAGNOSIS — Z8501 Personal history of malignant neoplasm of esophagus: Secondary | ICD-10-CM | POA: Diagnosis not present

## 2016-08-05 DIAGNOSIS — R0789 Other chest pain: Secondary | ICD-10-CM | POA: Diagnosis not present

## 2016-08-05 DIAGNOSIS — Z888 Allergy status to other drugs, medicaments and biological substances status: Secondary | ICD-10-CM | POA: Diagnosis not present

## 2016-08-05 DIAGNOSIS — Z87891 Personal history of nicotine dependence: Secondary | ICD-10-CM | POA: Diagnosis not present

## 2016-08-05 DIAGNOSIS — R6884 Jaw pain: Secondary | ICD-10-CM | POA: Diagnosis not present

## 2016-08-05 DIAGNOSIS — Z881 Allergy status to other antibiotic agents status: Secondary | ICD-10-CM | POA: Diagnosis not present

## 2016-08-05 DIAGNOSIS — R69 Illness, unspecified: Secondary | ICD-10-CM | POA: Diagnosis not present

## 2016-08-05 DIAGNOSIS — R079 Chest pain, unspecified: Secondary | ICD-10-CM | POA: Diagnosis not present

## 2016-08-05 DIAGNOSIS — R0602 Shortness of breath: Secondary | ICD-10-CM | POA: Diagnosis not present

## 2016-08-05 DIAGNOSIS — J449 Chronic obstructive pulmonary disease, unspecified: Secondary | ICD-10-CM | POA: Diagnosis not present

## 2016-08-05 DIAGNOSIS — E0590 Thyrotoxicosis, unspecified without thyrotoxic crisis or storm: Secondary | ICD-10-CM | POA: Diagnosis not present

## 2016-08-05 DIAGNOSIS — E785 Hyperlipidemia, unspecified: Secondary | ICD-10-CM | POA: Diagnosis not present

## 2016-08-06 DIAGNOSIS — R69 Illness, unspecified: Secondary | ICD-10-CM | POA: Diagnosis not present

## 2016-08-06 DIAGNOSIS — C160 Malignant neoplasm of cardia: Secondary | ICD-10-CM | POA: Diagnosis not present

## 2016-08-06 DIAGNOSIS — I517 Cardiomegaly: Secondary | ICD-10-CM | POA: Diagnosis not present

## 2016-08-06 DIAGNOSIS — I083 Combined rheumatic disorders of mitral, aortic and tricuspid valves: Secondary | ICD-10-CM | POA: Diagnosis not present

## 2016-08-06 DIAGNOSIS — R079 Chest pain, unspecified: Secondary | ICD-10-CM | POA: Diagnosis not present

## 2016-08-10 DIAGNOSIS — Z5111 Encounter for antineoplastic chemotherapy: Secondary | ICD-10-CM | POA: Diagnosis not present

## 2016-08-10 DIAGNOSIS — C787 Secondary malignant neoplasm of liver and intrahepatic bile duct: Secondary | ICD-10-CM | POA: Diagnosis not present

## 2016-08-10 DIAGNOSIS — C786 Secondary malignant neoplasm of retroperitoneum and peritoneum: Secondary | ICD-10-CM | POA: Diagnosis not present

## 2016-08-10 DIAGNOSIS — C160 Malignant neoplasm of cardia: Secondary | ICD-10-CM | POA: Diagnosis not present

## 2016-08-15 DIAGNOSIS — R52 Pain, unspecified: Secondary | ICD-10-CM | POA: Diagnosis not present

## 2016-08-15 DIAGNOSIS — Z923 Personal history of irradiation: Secondary | ICD-10-CM | POA: Diagnosis not present

## 2016-08-15 DIAGNOSIS — C786 Secondary malignant neoplasm of retroperitoneum and peritoneum: Secondary | ICD-10-CM | POA: Diagnosis not present

## 2016-08-15 DIAGNOSIS — C787 Secondary malignant neoplasm of liver and intrahepatic bile duct: Secondary | ICD-10-CM | POA: Diagnosis not present

## 2016-08-15 DIAGNOSIS — C160 Malignant neoplasm of cardia: Secondary | ICD-10-CM | POA: Diagnosis not present

## 2016-08-15 DIAGNOSIS — Z9221 Personal history of antineoplastic chemotherapy: Secondary | ICD-10-CM | POA: Diagnosis not present

## 2016-08-20 ENCOUNTER — Telehealth: Payer: Self-pay

## 2016-08-20 NOTE — Telephone Encounter (Signed)
Patient spouse called stated that patient cancer has come back and it is bad. He stated that she is now under Hospice care and he would like for her appointments to be cancelled at this time. Please advise. Breeona Waid,CMA

## 2016-08-22 NOTE — Telephone Encounter (Signed)
Call pt: Called and spoke with Guerry Minors and Ronalee Belts. She is now comfort care. She is on a pain regimen. She is taking in very little by mouth because of difficulty swallowing. She sounds at peace with everything.

## 2016-08-23 ENCOUNTER — Ambulatory Visit: Payer: Self-pay | Admitting: Internal Medicine

## 2016-09-05 ENCOUNTER — Other Ambulatory Visit: Payer: Self-pay | Admitting: Sports Medicine

## 2016-09-05 DIAGNOSIS — M7061 Trochanteric bursitis, right hip: Secondary | ICD-10-CM

## 2016-09-05 DEATH — deceased

## 2016-12-13 ENCOUNTER — Ambulatory Visit: Payer: Self-pay | Admitting: Family Medicine

## 2018-05-27 IMAGING — MR MR HEAD WO/W CM
12 of 14 series · 40 of 48 positions shown · IV contrast (multihance)
Comparison: Intraoperative cervical spine images 11 [id number].

CLINICAL DATA: 70-year-old female with new onset bilateral hand
tremor, face tingling, and loss of balance.
Personal history of gastric or gastroesophageal junction cancer
diagnosed in 5630, status post treatment including radiation, no
evidence of recurrent disease on EGD 05/06/2015.

EXAM:
MRI HEAD WITHOUT AND WITH CONTRAST
TECHNIQUE: Multiplanar, multiecho pulse sequences of the brain and surrounding
structures were obtained without and with intravenous contrast.
CONTRAST:  14mL MULTIHANCE GADOBENATE DIMEGLUMINE 529 MG/ML IV SOLN

[Series 2: T1 · sagittal · 4.5mm · 0.45mm/px · 2 of 23 slices shown]
[im 1/23]
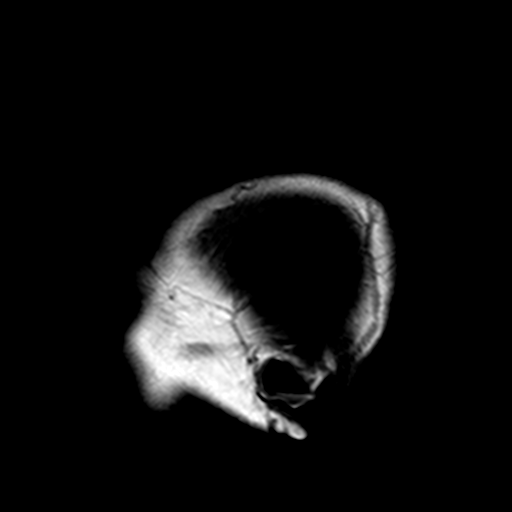
[im 23/23]
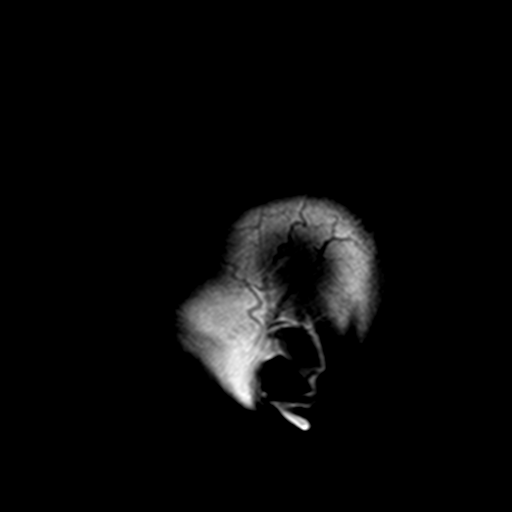

[Series 3: DWI · axial · 3.0mm · 2.19mm/px · z∈[-31,+111]mm · 8 of 90 slices shown (1 of 4)]
[im 1/90]
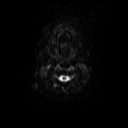
[im 13/90]
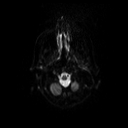
[im 26/90]
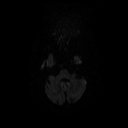
[im 39/90]
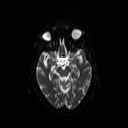
[im 51/90]
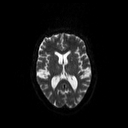
[im 64/90]
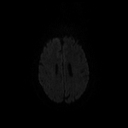
[im 77/90]
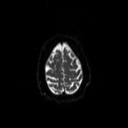
[im 90/90]
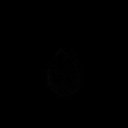

[Series 4: DWI · axial · 3.0mm · 2.19mm/px · z∈[-31,+111]mm · 4 of 45 slices shown (2 of 4)]
[im 1/45]
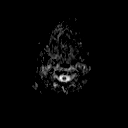
[im 15/45]
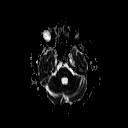
[im 30/45]
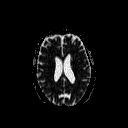
[im 45/45]
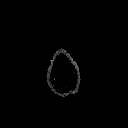

[Series 5: DWI · coronal · 3.0mm · 1.46mm/px · 8 of 90 slices shown (3 of 4)]
[im 1/90]
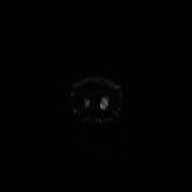
[im 13/90]
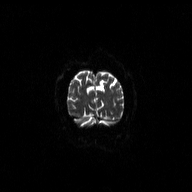
[im 26/90]
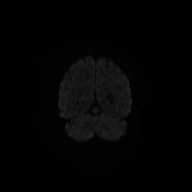
[im 39/90]
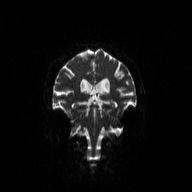
[im 51/90]
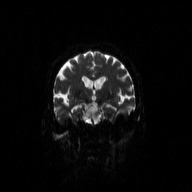
[im 64/90]
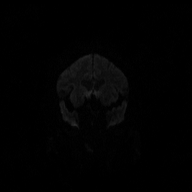
[im 77/90]
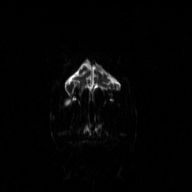
[im 90/90]
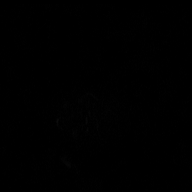

[Series 6: DWI · coronal · 3.0mm · 1.46mm/px · 4 of 47 slices shown (4 of 4)]
[im 1/47]
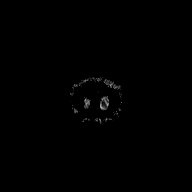
[im 16/47]
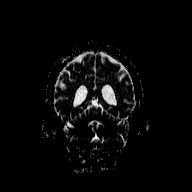
[im 31/47]
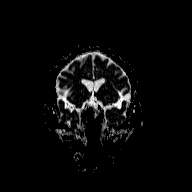
[im 47/47]
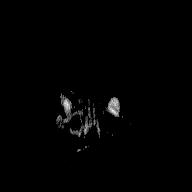

[Series 8: FLAIR · axial · 5.0mm · 0.45mm/px · z∈[-47,+98]mm · 2 of 22 slices shown (1 of 2)]
[im 1/22]
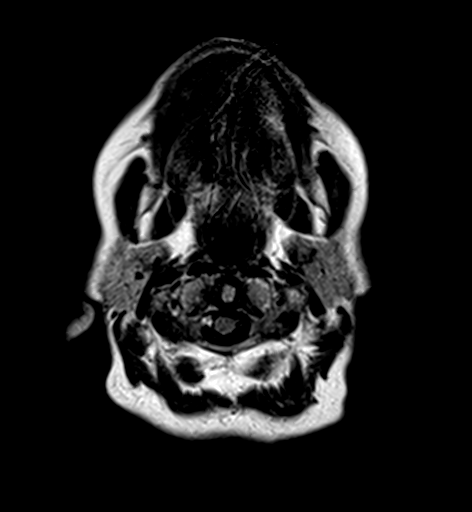
[im 22/22]
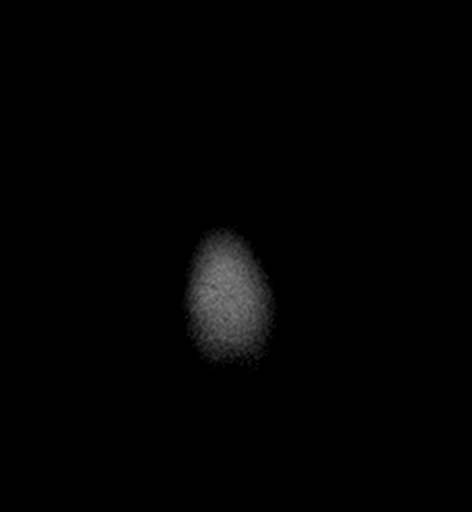

[Series 9: T2 · axial · 5.0mm · 0.45mm/px · z∈[-47,+99]mm · 2 of 22 slices shown (1 of 2)]
[im 1/22]
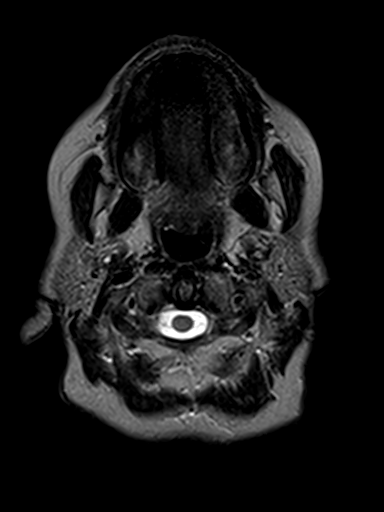
[im 22/22]
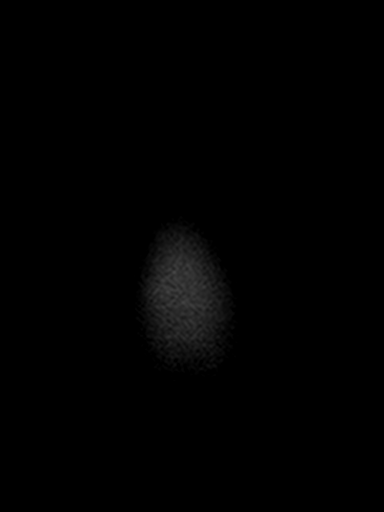

[Series 10: T2 · axial · 5.0mm · 0.45mm/px · z∈[-45,+100]mm · 2 of 22 slices shown (2 of 2)]
[im 1/22]
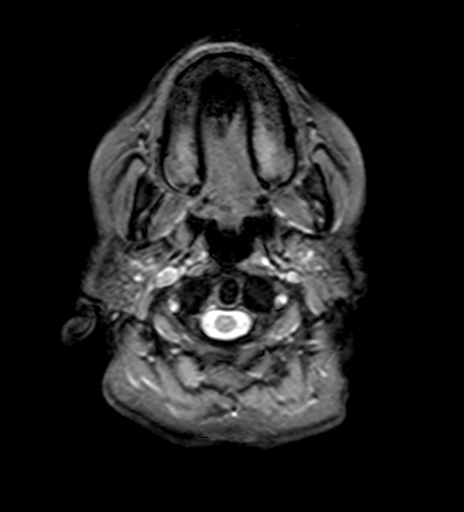
[im 22/22]
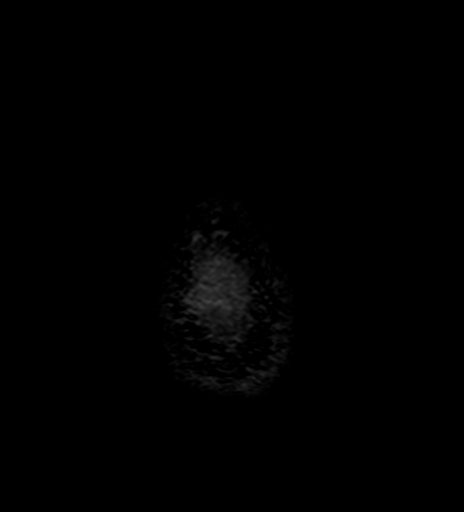

[Series 12: FLAIR · axial · 5.0mm · 0.90mm/px · z∈[-48,+98]mm · 2 of 22 slices shown (2 of 2)]
[im 1/22]
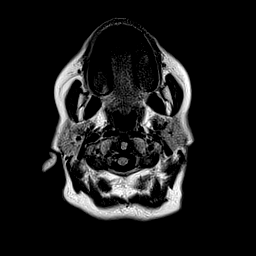
[im 22/22]
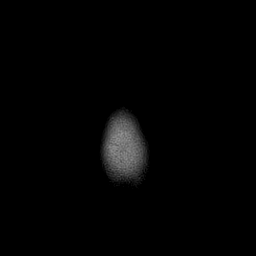

[Series 13: T2 post-contrast · coronal · 5.0mm · 0.45mm/px · 2 of 28 slices shown]
[im 1/28]
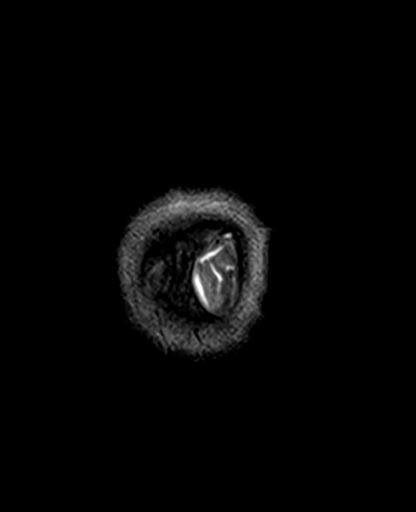
[im 28/28]
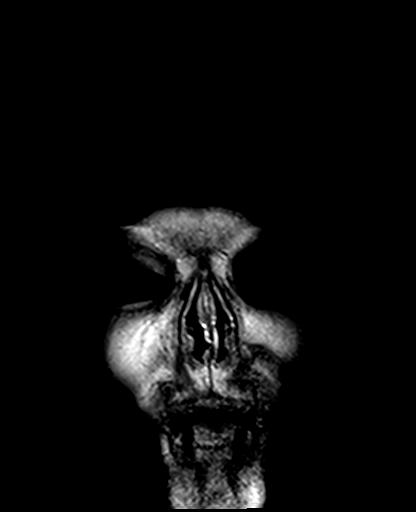

[Series 15: T1 post-contrast · coronal · 5.0mm · 0.45mm/px · 2 of 28 slices shown (1 of 2)]
[im 1/28]
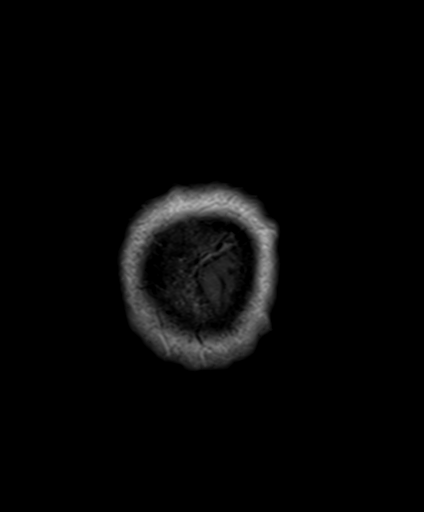
[im 28/28]
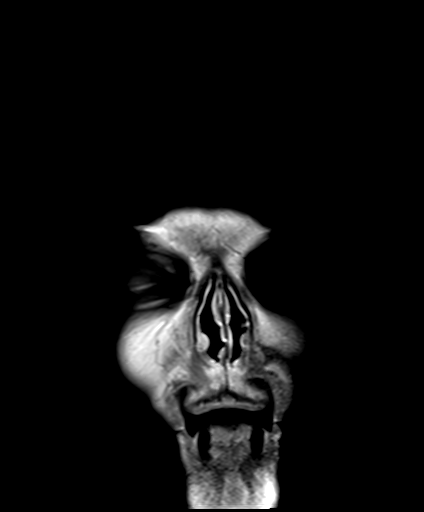

[Series 16: T1 post-contrast · sagittal · 4.5mm · 0.45mm/px · 2 of 23 slices shown (2 of 2)]
[im 1/23]
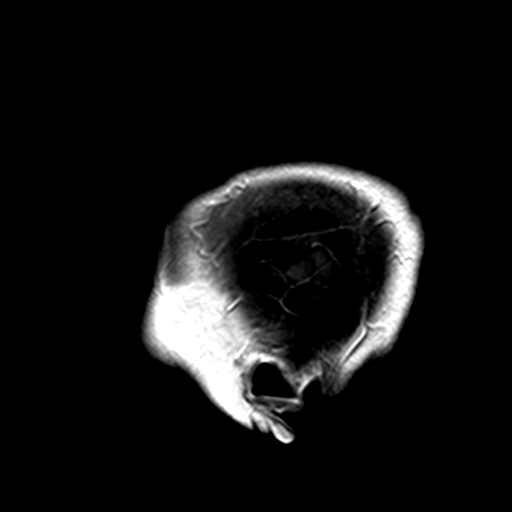
[im 23/23]
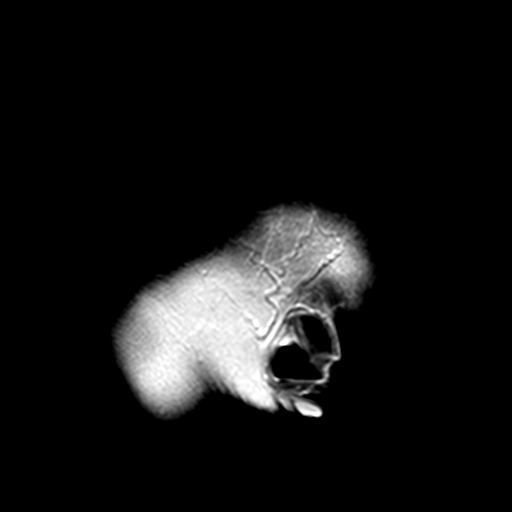

[40 of 48 positions shown; findings below may reference images not displayed]

FINDINGS: Brain: Cerebral volume is within normal limits for age. No
restricted diffusion to suggest acute infarction. No midline shift,
mass effect, evidence of mass lesion, ventriculomegaly, extra-axial
collection or acute intracranial hemorrhage. Cervicomedullary
junction and pituitary are within normal limits.

No abnormal enhancement identified. No dural thickening. There is a
small area of cortical encephalomalacia in the anterior left
parietal lobe, up at or near the sensory strip (series 12 images 15
and 16). No other cortical encephalomalacia identified. No chronic
cerebral blood products. Other cerebral hemisphere signal is within
normal limits. In the deep gray matter nuclei there is mild to
moderate T2 heterogeneity (series 9, image 12) suggesting chronic
lacunar infarcts. The brainstem and cerebellum are normal for age.

Vascular: Major intracranial vascular flow voids are preserved.

Skull and upper cervical spine: Negative visible cervical spine.
Visualized bone marrow signal is within normal limits.

Sinuses/Orbits: Visualized paranasal sinuses and mastoids are well
pneumatized. Postoperative changes to both globes, otherwise
negative orbits soft tissues.

Other: Visualized internal auditory structures appear within normal
limits. Negative scalp soft tissues.
IMPRESSION: 1.  No metastatic disease or acute intracranial abnormality.
2. Small chronic lacunar infarcts in the bilateral deep gray matter
nuclei, and small chronic infarct in the posterior left MCA
territory.
